# Patient Record
Sex: Male | Born: 1985
Health system: Southern US, Community
[De-identification: ages and names within clinical notes are randomized; demographics above are authoritative.]

## PROBLEM LIST (undated history)

## (undated) DIAGNOSIS — E669 Obesity, unspecified: Secondary | ICD-10-CM

## (undated) DIAGNOSIS — J45909 Unspecified asthma, uncomplicated: Secondary | ICD-10-CM

## (undated) DIAGNOSIS — R509 Fever, unspecified: Secondary | ICD-10-CM

## (undated) DIAGNOSIS — I10 Essential (primary) hypertension: Secondary | ICD-10-CM

## (undated) DIAGNOSIS — R51 Headache: Secondary | ICD-10-CM

## (undated) DIAGNOSIS — G709 Myoneural disorder, unspecified: Secondary | ICD-10-CM

## (undated) HISTORY — DX: Obesity, unspecified: E66.9

## (undated) HISTORY — DX: Essential (primary) hypertension: I10

## (undated) HISTORY — DX: Fever, unspecified: R50.9

## (undated) HISTORY — PX: TONSILLECTOMY: SUR1361

## (undated) HISTORY — DX: Headache: R51

---
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
## 2006-06-09 ENCOUNTER — Ambulatory Visit: Payer: Self-pay | Admitting: Family Medicine

## 2006-06-23 ENCOUNTER — Encounter: Payer: Self-pay | Admitting: Family Medicine

## 2006-06-23 LAB — CONVERTED CEMR LAB
CO2: 24 meq/L (ref 19–32)
Chloride: 103 meq/L (ref 96–112)
Glucose, Bld: 88 mg/dL (ref 70–99)
Lymphocytes Relative: 35 % (ref 12–46)
Lymphs Abs: 3.2 10*3/uL (ref 0.7–3.3)
Neutrophils Relative %: 55 % (ref 43–77)
Platelets: 322 10*3/uL (ref 150–400)
Potassium: 4.1 meq/L (ref 3.5–5.3)
Sodium: 140 meq/L (ref 135–145)
TSH: 1.957 microintl units/mL (ref 0.350–5.50)
Total CHOL/HDL Ratio: 5.2
VLDL: 40 mg/dL (ref 0–40)
WBC: 9.2 10*3/uL (ref 4.0–10.5)

## 2006-07-08 ENCOUNTER — Ambulatory Visit: Payer: Self-pay | Admitting: Family Medicine

## 2006-07-11 ENCOUNTER — Emergency Department (HOSPITAL_COMMUNITY): Admission: EM | Admit: 2006-07-11 | Discharge: 2006-07-11 | Payer: Self-pay | Admitting: Emergency Medicine

## 2006-07-12 ENCOUNTER — Emergency Department (HOSPITAL_COMMUNITY): Admission: EM | Admit: 2006-07-12 | Discharge: 2006-07-13 | Payer: Self-pay | Admitting: Emergency Medicine

## 2006-07-13 ENCOUNTER — Ambulatory Visit: Payer: Self-pay | Admitting: Family Medicine

## 2006-07-13 ENCOUNTER — Encounter: Payer: Self-pay | Admitting: Emergency Medicine

## 2007-08-19 ENCOUNTER — Telehealth: Payer: Self-pay | Admitting: Family Medicine

## 2007-08-26 DIAGNOSIS — I10 Essential (primary) hypertension: Secondary | ICD-10-CM | POA: Insufficient documentation

## 2007-08-26 DIAGNOSIS — R51 Headache: Secondary | ICD-10-CM

## 2007-08-26 DIAGNOSIS — R519 Headache, unspecified: Secondary | ICD-10-CM | POA: Insufficient documentation

## 2007-11-14 ENCOUNTER — Telehealth: Payer: Self-pay | Admitting: Family Medicine

## 2007-11-17 ENCOUNTER — Ambulatory Visit: Payer: Self-pay | Admitting: Family Medicine

## 2007-11-17 LAB — CONVERTED CEMR LAB
Calcium: 9.3 mg/dL (ref 8.4–10.5)
Cholesterol: 136 mg/dL (ref 0–200)
Glucose, Bld: 100 mg/dL — ABNORMAL HIGH (ref 70–99)
HDL: 31 mg/dL — ABNORMAL LOW (ref >39)
Sodium: 135 meq/L (ref 135–145)
Total CHOL/HDL Ratio: 4.4

## 2008-01-18 ENCOUNTER — Telehealth: Payer: Self-pay | Admitting: Family Medicine

## 2008-01-19 ENCOUNTER — Encounter: Payer: Self-pay | Admitting: Family Medicine

## 2008-03-13 ENCOUNTER — Encounter: Payer: Self-pay | Admitting: Family Medicine

## 2008-03-15 ENCOUNTER — Telehealth: Payer: Self-pay | Admitting: Family Medicine

## 2008-04-30 ENCOUNTER — Telehealth: Payer: Self-pay | Admitting: Family Medicine

## 2008-05-01 ENCOUNTER — Telehealth: Payer: Self-pay | Admitting: Family Medicine

## 2008-05-02 ENCOUNTER — Emergency Department (HOSPITAL_COMMUNITY): Admission: EM | Admit: 2008-05-02 | Discharge: 2008-05-02 | Payer: Self-pay | Admitting: Emergency Medicine

## 2008-05-28 ENCOUNTER — Ambulatory Visit: Payer: Self-pay | Admitting: Family Medicine

## 2008-05-29 ENCOUNTER — Telehealth: Payer: Self-pay | Admitting: Family Medicine

## 2008-05-30 ENCOUNTER — Telehealth: Payer: Self-pay | Admitting: Family Medicine

## 2008-06-11 ENCOUNTER — Telehealth: Payer: Self-pay | Admitting: Family Medicine

## 2008-06-11 ENCOUNTER — Ambulatory Visit: Payer: Self-pay | Admitting: Family Medicine

## 2008-06-12 ENCOUNTER — Encounter: Payer: Self-pay | Admitting: Family Medicine

## 2008-06-12 ENCOUNTER — Telehealth: Payer: Self-pay | Admitting: Family Medicine

## 2008-06-13 ENCOUNTER — Telehealth: Payer: Self-pay | Admitting: Family Medicine

## 2008-06-14 ENCOUNTER — Encounter (INDEPENDENT_AMBULATORY_CARE_PROVIDER_SITE_OTHER): Payer: Self-pay

## 2008-06-14 ENCOUNTER — Encounter: Payer: Self-pay | Admitting: Family Medicine

## 2008-06-14 LAB — CONVERTED CEMR LAB
Calcium: 9.5 mg/dL (ref 8.4–10.5)
Eosinophils Absolute: 0.1 10*3/uL (ref 0.0–0.7)
Eosinophils Relative: 1 % (ref 0–5)
HCT: 47.3 % (ref 39.0–52.0)
Lymphs Abs: 5 10*3/uL — ABNORMAL HIGH (ref 0.7–4.0)
MCV: 89.4 fL (ref 78.0–100.0)
Monocytes Absolute: 2.3 10*3/uL — ABNORMAL HIGH (ref 0.1–1.0)
Platelets: 263 10*3/uL (ref 150–400)
Potassium: 4.2 meq/L (ref 3.5–5.3)
Sodium: 141 meq/L (ref 135–145)
WBC: 10.5 10*3/uL (ref 4.0–10.5)

## 2008-06-15 ENCOUNTER — Encounter: Payer: Self-pay | Admitting: Family Medicine

## 2008-06-22 LAB — CONVERTED CEMR LAB
AST: 28 units/L (ref 0–37)
Alkaline Phosphatase: 78 units/L (ref 39–117)
Bilirubin, Direct: 0.1 mg/dL (ref 0.0–0.3)
EBV NA IgG: 0.91 — ABNORMAL HIGH
Indirect Bilirubin: 0.1 mg/dL (ref 0.0–0.9)
Total Bilirubin: 0.2 mg/dL — ABNORMAL LOW (ref 0.3–1.2)

## 2008-10-30 ENCOUNTER — Telehealth: Payer: Self-pay | Admitting: Family Medicine

## 2008-11-01 ENCOUNTER — Telehealth: Payer: Self-pay | Admitting: Family Medicine

## 2008-11-29 ENCOUNTER — Telehealth: Payer: Self-pay | Admitting: Family Medicine

## 2009-01-08 ENCOUNTER — Telehealth: Payer: Self-pay | Admitting: Family Medicine

## 2009-01-23 ENCOUNTER — Telehealth: Payer: Self-pay | Admitting: Family Medicine

## 2009-01-24 ENCOUNTER — Telehealth: Payer: Self-pay | Admitting: Family Medicine

## 2009-03-08 ENCOUNTER — Telehealth: Payer: Self-pay | Admitting: Physician Assistant

## 2009-03-11 ENCOUNTER — Ambulatory Visit: Payer: Self-pay | Admitting: Family Medicine

## 2009-03-25 ENCOUNTER — Ambulatory Visit: Payer: Self-pay | Admitting: Family Medicine

## 2009-04-08 ENCOUNTER — Ambulatory Visit: Payer: Self-pay | Admitting: Family Medicine

## 2009-04-08 ENCOUNTER — Telehealth: Payer: Self-pay | Admitting: Physician Assistant

## 2009-04-08 LAB — CONVERTED CEMR LAB: Rapid Strep: NEGATIVE

## 2009-09-30 ENCOUNTER — Telehealth: Payer: Self-pay | Admitting: Family Medicine

## 2009-10-29 ENCOUNTER — Ambulatory Visit: Payer: Self-pay | Admitting: Family Medicine

## 2009-10-29 DIAGNOSIS — J309 Allergic rhinitis, unspecified: Secondary | ICD-10-CM | POA: Insufficient documentation

## 2009-11-06 ENCOUNTER — Ambulatory Visit: Payer: Self-pay | Admitting: Family Medicine

## 2009-11-06 DIAGNOSIS — J310 Chronic rhinitis: Secondary | ICD-10-CM

## 2009-12-18 ENCOUNTER — Telehealth (INDEPENDENT_AMBULATORY_CARE_PROVIDER_SITE_OTHER): Payer: Self-pay | Admitting: *Deleted

## 2009-12-18 ENCOUNTER — Ambulatory Visit: Payer: Self-pay | Admitting: Family Medicine

## 2010-02-20 NOTE — Assessment & Plan Note (Signed)
Summary: SORE THROAT   Vital Signs:  Patient profile:   25 year old male Height:      71 inches Weight:      370.75 pounds BMI:     51.90 O2 Sat:      100 % on Room air Pulse rate:   90 / minute Resp:     16 per minute BP sitting:   126 / 74  (left arm)  Vitals Entered By: Mauricia Area CMA (November 06, 2009 1:48 PM) CC: Congestion, cough. Slight headache since last night   CC:  Congestion and cough. Slight headache since last night.  History of Present Illness: Pt states his nasal congestion has worsened in the last 2 days.  Mucus is still clear.  Nonproductive cough.  Throat is much better.  No fever or chills.  Has not started taking Loratadine as suggested.  Admits he has been using an over the counter nasal spray "for awhile."  Current Medications (verified): 1)  Lisinopril-Hydrochlorothiazide 20-25 Mg Tabs (Lisinopril-Hydrochlorothiazide) .[name] Kitchen.. 1 Daily in Am  Allergies (verified): No Known Drug Allergies  Past History:  Past medical history reviewed for relevance to current acute and chronic problems.  Past Medical History: Reviewed history from 08/26/2007 and no changes required. * Note: FEBRILE ILLNESS HEADACHE (ICD-784.0) OBESITY (ICD-278.00) HYPERTENSION (ICD-401.9)  Review of Systems General:  Denies chills and fever. ENT:  Complains of nasal congestion, postnasal drainage, and sore throat; denies earache and sinus pressure. CV:  Denies chest pain or discomfort. Resp:  Complains of cough; denies shortness of breath, sputum productive, and wheezing.  Physical Exam  General:  Well-developed,well-nourished,in no acute distress; alert,appropriate and cooperative throughout examination Head:  Normocephalic and atraumatic without obvious abnormalities. No apparent alopecia or balding. Ears:  External ear exam shows no significant lesions or deformities.  Otoscopic examination reveals clear canals, tympanic membranes are intact bilaterally without bulging,  retraction, inflammation or discharge. Hearing is grossly normal bilaterally. Nose:  no external deformity, no sinus percussion tenderness, mucosal erythema, and mucosal edema with clear mucus Mouth:  Oral mucosa and oropharynx without lesions or exudates.   Neck:  No deformities, masses, or tenderness noted. Lungs:  Normal respiratory effort, chest expands symmetrically. Lungs are clear to auscultation, no crackles or wheezes. Heart:  Normal rate and regular rhythm. S1 and S2 normal without gallop, murmur, click, rub or other extra sounds. Cervical Nodes:  No lymphadenopathy noted Psych:  Cognition and judgment appear intact. Alert and cooperative with normal attention span and concentration. No apparent delusions, illusions, hallucinations   Impression & Recommendations:  Problem # 1:  ALLERGIC RHINITIS (ICD-477.9) Assessment Deteriorated  His updated medication list for this problem includes:    Flonase 50 Mcg/act Susp (Fluticasone propionate) ..... Use 2 sprays each nostril once a day  Problem # 2:  RHINITIS MEDICAMENTOSA (ICD-472.0) Assessment: New Discussed with pt that his long term use of over the counter nasal spray is worsening his nasal congestion and swelling.  Discussed that he must stop using this and to expect his symptoms to worsen before they improve.  Complete Medication List: 1)  Lisinopril-hydrochlorothiazide 20-25 Mg Tabs (Lisinopril-hydrochlorothiazide) .[name] Kitchen.. 1 daily in am 2)  Flonase 50 Mcg/act Susp (Fluticasone propionate) .... Use 2 sprays each nostril once a day  Patient Instructions: 1)  Please schedule a follow-up appointment as needed. 2)  You must stop using the over the counter nasal spray.  This is worsening your nasal congestion and swelling. 3)  Use the prescription nasal spray as discussed.  4)  You still should take Loratadine 10 mg once a day. 5)  Increase fluids, rest, etc. Prescriptions: FLONASE 50 MCG/ACT SUSP (FLUTICASONE PROPIONATE) use 2  sprays each nostril once a day  #1 x 1   Entered and Authorized by:   Esperanza Sheets PA   Signed by:   Esperanza Sheets PA on 11/06/2009   Method used:   Electronically to        [facility]  [address] (retail)       [address]       [place], [place]  [address]       Ph: [phone]       Fax: [phone]   RxID:   [id number]    Orders Added: 1)  Est. Patient Level III [401[address]]

## 2010-02-20 NOTE — Progress Notes (Signed)
Summary: blood pressure pill  Phone Note Call from Patient   Summary of Call: needs to get blood pressure samples are either call in a rx   [facility] [phone] Initial call taken by: Rudene Anda,  September 30, 2009 10:16 AM  Follow-up for Phone Call        med sent Follow-up by: Adella Hare LPN,  September 30, 2009 11:19 AM

## 2010-02-20 NOTE — Progress Notes (Signed)
Summary: [facility]  Phone Note Call from Patient   Summary of Call: WANTS YOU TO SEND TO [facility] THE BP MEDS Initial call taken by: Lind Guest,  January 24, 2009 9:39 AM  Follow-up for Phone Call        called patient, left message patient needs ov, will fill for one month, needs ov Follow-up by: Worthy Keeler LPN,  January 24, 2009 10:25 AM  Additional Follow-up for Phone Call Additional follow up Details #1::        sent to [facility] as requested advised ov needed within one month Additional Follow-up by: Worthy Keeler LPN,  January 25, 2009 9:04 AM    Prescriptions: MAXZIDE-25 37.5-25 MG TABS (TRIAMTERENE-HCTZ) one tab by mouth qd  #90 x 0   Entered by:   Worthy Keeler LPN   Authorized by:   Syliva Overman MD   Signed by:   Worthy Keeler LPN on 16/10/9602   Method used:   Electronically to        [facility]  [address] (retail)       [address]       [place], [place]  [address]       Ph: [phone]       Fax: [phone]   RxID:   [id number]

## 2010-02-20 NOTE — Letter (Signed)
Summary: med review sheet  med review sheet   Imported By: Rudene Anda 10/29/2009 11:45:29  _____________________________________________________________________  External Attachment:    Type:   Image     Comment:   External Document

## 2010-02-20 NOTE — Progress Notes (Signed)
  Phone Note Call from Patient   Summary of Call: Patient states BP meds are $90 and he can't afford them. Is there something cheaper that he can be changed to? [facility] Initial call taken by: Everitt Amber LPN,  April 08, 2009 3:09 PM  Follow-up for Phone Call        Pls check with [facility].  This is on their $4 list.  And this is where his prescription was sent to. Follow-up by: Esperanza Sheets PA,  April 08, 2009 4:15 PM  Additional Follow-up for Phone Call Additional follow up Details #1::        Patients grandmother had called. She must not have known it was sent to [facility]. Patient aware Additional Follow-up by: Everitt Amber LPN,  April 08, 2009 4:20 PM

## 2010-02-20 NOTE — Assessment & Plan Note (Signed)
Summary: follow up bp- room 2   Vital Signs:  Patient profile:   25 year old male Height:      71 inches Weight:      374.75 pounds BMI:     52.46 O2 Sat:      95 % on Room air Pulse rate:   90 / minute Resp:     16 per minute BP sitting:   160 / 82  (left arm)  Vitals Entered By: Adella Hare LPN (March 11, 2009 10:58 AM) CC: follow up/ bp  Is Patient Diabetic? No Pain Assessment Patient in pain? no        CC:  follow up/ bp .  History of Present Illness: Pt here today to f/u on HTN & discuss BP meds.  He was well controlled x 2-3 yrs on Benicar HCT but had to d/c due to cost.  Does not currently have any health ins. Pt was changed to [facility] & pt states this caused him to have headaches.  He has been out of the medication for about 1 wk & HA's have resolved.  Current Medications (verified): 1)  Maxzide-25 37.5-25 Mg Tabs (Triamterene-Hctz) .... One Tab By Mouth Qd  Allergies (verified): No Known Drug Allergies  Past History:  Past medical, surgical, family and social histories (including risk factors) reviewed for relevance to current acute and chronic problems.  Past Medical History: Reviewed history from 08/26/2007 and no changes required. * Note: FEBRILE ILLNESS HEADACHE (ICD-784.0) OBESITY (ICD-278.00) HYPERTENSION (ICD-401.9)  Past Surgical History: Reviewed history from 08/26/2007 and no changes required. Right ankle surgery 2002  Family History: Reviewed history from 08/26/2007 and no changes required. NO CJILDREN MOTHER  LIVING  HYPERTENSION AND OBESE FATHER  HEALTHY NO SIBLINGS  Social History: Reviewed history from 08/26/2007 and no changes required. COLLEGE STUDENT Single Never Smoked Alcohol use-no Drug use-no  Review of Systems General:  Denies fatigue and fever. ENT:  Denies nasal congestion, ringing in ears, and sinus pressure. CV:  Denies chest pain or discomfort and palpitations. Resp:  Denies cough and shortness of  breath.  Physical Exam  General:  Well-developed,well-nourished,in no acute distress; alert,appropriate and cooperative throughout examination Head:  Normocephalic and atraumatic without obvious abnormalities. No apparent alopecia or balding. Eyes:  No corneal or conjunctival inflammation noted. EOMI. Perrla. Funduscopic exam benign, without hemorrhages, exudates or papilledema.  Ears:  External ear exam shows no significant lesions or deformities.  Otoscopic examination reveals clear canals, tympanic membranes are intact bilaterally without bulging, retraction, inflammation or discharge. Hearing is grossly normal bilaterally. Nose:  External nasal examination shows no deformity or inflammation. Nasal mucosa are pink and moist without lesions or exudates. Mouth:  Oral mucosa and oropharynx without lesions or exudates.  Teeth in good repair. Neck:  No deformities, masses, or tenderness noted. Lungs:  Normal respiratory effort, chest expands symmetrically. Lungs are clear to auscultation, no crackles or wheezes. Heart:  Normal rate and regular rhythm. S1 and S2 normal without gallop, murmur, click, rub or other extra sounds. Cervical Nodes:  No lymphadenopathy noted Psych:  Cognition and judgment appear intact. Alert and cooperative with normal attention span and concentration. No apparent delusions, illusions, hallucinations   Impression & Recommendations:  Problem # 1:  HYPERTENSION (ICD-401.9) Assessment Deteriorated  The following medications were removed from the medication list:    Maxzide-25 37.5-25 Mg Tabs (Triamterene-hctz) ..... One tab by mouth qd His updated medication list for this problem includes:    Lisinopril-hydrochlorothiazide 20-12.5 Mg Tabs (Lisinopril-hydrochlorothiazide) .[name] KitchenMarland KitchenMarland KitchenMarland Kitchen  1 q am for high blood pressure  BP today: 160/82 Prior BP: 124/84 (06/11/2008)  Labs Reviewed: K+: 4.2 (06/14/2008) Creat: : 1.18 (06/14/2008)   Chol: 136 (11/17/2007)   HDL: 31 (11/17/2007)    LDL: 82 (11/17/2007)   TG: 115 (11/17/2007)  Discussed with pt that he will need lab work in the future.  He is interviewing for a full time job with benefits & hopes he will have health ins in the next few mos.   Problem # 2:  HEADACHE (ICD-784.0) Assessment: Improved  Pt states this has resolved. Was due to [facility].  Problem # 3:  OBESITY (ICD-278.00)  Ht: 71 (03/11/2009)   Wt: 374.75 (03/11/2009)   BMI: 52.46 (03/11/2009)  Encouraged healthier diet & wt loss.   Complete Medication List: 1)  Lisinopril-hydrochlorothiazide 20-12.5 Mg Tabs (Lisinopril-hydrochlorothiazide) .[name] Kitchen.. 1 q am for high blood pressure  Patient Instructions: 1)  Please schedule a follow-up appointment in 3 months. 2)  BP check at the office in 2 weeks. 3)  It is important that you exercise regularly at least 20 minutes 5 times a week. If you develop chest pain, have severe difficulty breathing, or feel very tired , stop exercising immediately and seek medical attention. 4)  You need to lose weight. Consider a lower calorie diet and regular exercise.  Prescriptions: LISINOPRIL-HYDROCHLOROTHIAZIDE 20-12.5 MG TABS (LISINOPRIL-HYDROCHLOROTHIAZIDE) 1 q am for high blood pressure  #90 x 0   Entered and Authorized by:   Esperanza Sheets PA   Signed by:   Esperanza Sheets PA on 03/11/2009   Method used:   Electronically to        [facility]   [address]ail[facility]       [address]       [place], [place]  [address]       Ph: [phone]       Fax: [phone]   RxID:   [id number]

## 2010-02-20 NOTE — Assessment & Plan Note (Signed)
Summary: ov   Vital Signs:  Patient profile:   25 year old male Height:      71 inches Weight:      362.75 pounds O2 Sat:      96 % on Room air Pulse rate:   88 / minute Pulse rhythm:   regular Resp:     16 per minute BP sitting:   140 / 80  (left arm)  Vitals Entered By: Adella Hare LPN (December 18, 2009 3:55 PM)  O2 Flow:  Room air CC: head congestion, chills Is Patient Diabetic? No Pain Assessment Patient in pain? no        CC:  head congestion and chills.  History of Present Illness: Sore throat , chills  and head congestion for 2 days. Reports  that prior to this he had been doing well. Denies recent fever or chills.  Denies chest congestion, or cough productive of sputum. Denies chest pain, palpitations, PND, orthopnea or leg swelling. Denies abdominal pain, nausea, vomitting, diarrhea or constipation. Denies change in bowel movements or bloody stool. Denies dysuria , frequency, incontinence or hesitancy. Denies  joint pain, swelling, or reduced mobility. Denies headaches, vertigo, seizures. Denies depression, anxiety or insomnia. Denies  rash, lesions, or itch.  he has been working on lifestyle modification and has had weight loss success for which he is commended.   Current Medications (verified): 1)  Lisinopril-Hydrochlorothiazide 20-25 Mg Tabs (Lisinopril-Hydrochlorothiazide) .[name] Kitchen.. 1 Daily in Am 2)  Flonase 50 Mcg/act Susp (Fluticasone Propionate) .... Use 2 Sprays Each Nostril Once A Day  Allergies (verified): No Known Drug Allergies  Social History: Engineer, maintenance (IT).Employed Single Living with grandparents Never Smoked Alcohol use-no Drug use-no  Review of Systems      See HPI General:  Complains of chills and fatigue. Eyes:  Denies blurring. ENT:  Complains of nasal congestion, postnasal drainage, and sore throat; denies hoarseness and sinus pressure. Neuro:  Complains of headaches. Endo:  Denies cold intolerance, excessive hunger,  excessive thirst, and excessive urination. Heme:  Denies abnormal bruising and bleeding. Allergy:  Complains of seasonal allergies.  Physical Exam  General:  Well-developed,wobese,in no acute distress; alert,appropriate and cooperative throughout examination HEENT: No facial asymmetry,  EOMI, No sinus tenderness, TM's Clear, oropharynx  erythematous, no exudate noted  Chest: Clear to auscultation bilaterally.  CVS: S1, S2, No murmurs, No S3.   Abd: Soft, Nontender.  MS: Adequate ROM spine, hips, shoulders and knees.  Ext: No edema.   CNS: CN 2-12 intact, power tone and sensation normal throughout.   Skin: Intact, no visible lesions or rashes.  Psych: Good eye contact, normal affect.  Memory intact, not anxious or depressed appearing.    Impression & Recommendations:  Problem # 1:  PHARYNGITIS, ACUTE (ICD-462) Assessment Comment Only  His updated medication list for this problem includes:    Penicillin V Potassium 500 Mg Tabs (Penicillin v potassium) .[name] Kitchen... Take 1 tablet by mouth three times a day  Problem # 2:  OBESITY (ICD-278.00) Assessment: Improved  Ht: 71 (12/18/2009)   Wt: 362.75 (12/18/2009)   BMI: 51.90 (11/06/2009)  Problem # 3:  HYPERTENSION (ICD-401.9) Assessment: Deteriorated  His updated medication list for this problem includes:    Lisinopril-hydrochlorothiazide 20-25 Mg Tabs (Lisinopril-hydrochlorothiazide) .[name] Kitchen... 1 daily in am  BP today: 140/80 Prior BP: 126/74 (11/06/2009)  Labs Reviewed: K+: 4.2 (06/14/2008) Creat: : 1.18 (06/14/2008)   Chol: 136 (11/17/2007)   HDL: 31 (11/17/2007)   LDL: 82 (11/17/2007)   TG: 115 (11/17/2007)  Complete  Medication List: 1)  Lisinopril-hydrochlorothiazide 20-25 Mg Tabs (Lisinopril-hydrochlorothiazide) .[name] Kitchen.. 1 daily in am 2)  Flonase 50 Mcg/act Susp (Fluticasone propionate) .... Use 2 sprays each nostril once a day 3)  Penicillin V Potassium 500 Mg Tabs (Penicillin v potassium) .... Take 1 tablet by mouth three times a  day  Patient Instructions: 1)  Please schedule a follow-up appointment in 4.5 months. 2)  you are being treated for pharyngitis, penicillin is sent rto your pharmacy Prescriptions: PENICILLIN V POTASSIUM 500 MG TABS (PENICILLIN V POTASSIUM) Take 1 tablet by mouth three times a day  #21 x 0   Entered and Authorized by:   Syliva Overman MD   Signed by:   Syliva Overman MD on 12/18/2009   Method used:   Electronically to        [facility]  [address]       [address]       [place], [place]  [address]       Ph: [phone]       Fax: [phone]   RxID:   [id number]    Orders Added: 1)  Est. Patient Level IV [93818]  Appended Document: ov  Laboratory Results    Other Tests  Rapid Strep: negative

## 2010-02-20 NOTE — Assessment & Plan Note (Signed)
Summary: FEVER   room 2   Vital Signs:  Patient profile:   25 year old male Height:      71 inches Weight:      376.25 pounds BMI:     52.67 O2 Sat:      96 % Temp:     100.1 degrees F oral Pulse rate:   112 / minute Resp:     16 per minute BP sitting:   142 / 74  (left arm) Cuff size:   xlg  Vitals Entered By: Everitt Amber LPN (April 08, 2009 10:03 AM) CC: fever, body aches, sore throat, headaches, dizziness, nausea since yesterday    CC:  fever, body aches, sore throat, headaches, dizziness, and nausea since yesterday .  History of Present Illness: Pt presents today with c/o sore thrt x 2 days. temp 101+ since last night  Taking Tylenol, & Theraflu. Also has nasal congestion & post nasal drainage.  No cough.  He is taking his new BP meds.  No probs with.  BP is improved today from last visit.   Current Medications (verified): 1)  Lisinopril-Hydrochlorothiazide 20-25 Mg Tabs (Lisinopril-Hydrochlorothiazide) .[name] Kitchen.. 1 Daily in Am  Allergies (verified): No Known Drug Allergies  Past History:  Past medical history reviewed for relevance to current acute and chronic problems.  Past Medical History: Reviewed history from 08/26/2007 and no changes required. * Note: FEBRILE ILLNESS HEADACHE (ICD-784.0) OBESITY (ICD-278.00) HYPERTENSION (ICD-401.9)  Review of Systems General:  Complains of chills and fever. ENT:  Complains of nasal congestion, postnasal drainage, and sore throat; denies earache and sinus pressure. CV:  Denies chest pain or discomfort. Resp:  Denies cough. Heme:  Denies enlarge lymph nodes.  Physical Exam  General:  Pt appears ill but NAD.  alert, well-developed, well-nourished, well-hydrated, and appropriate dress.   Head:  Normocephalic and atraumatic without obvious abnormalities. No apparent alopecia or balding. Ears:  External ear exam shows no significant lesions or deformities.  Otoscopic examination reveals clear canals, tympanic membranes are  intact bilaterally without bulging, retraction, inflammation or discharge. Hearing is grossly normal bilaterally. Nose:  External nasal examination shows no deformity or inflammation. Nasal mucosa are pink and moist without lesions or exudates. Mouth:  good dentition, no exudates, no postnasal drip, no aphthous ulcers, no tongue abnormalities, and pharyngeal erythema.   Neck:  No deformities, masses, or tenderness noted. Lungs:  Normal respiratory effort, chest expands symmetrically. Lungs are clear to auscultation, no crackles or wheezes. Heart:  Normal rate and regular rhythm. S1 and S2 normal without gallop, murmur, click, rub or other extra sounds. Cervical Nodes:  shotty submand nodes bilat Psych:  Cognition and judgment appear intact. Alert and cooperative with normal attention span and concentration. No apparent delusions, illusions, hallucinations   Impression & Recommendations:  Problem # 1:  PHARYNGITIS, ACUTE (ICD-462) Assessment New  His updated medication list for this problem includes:    Amoxicillin 500 Mg Caps (Amoxicillin) .[name] Kitchen... Take 1 three times a day x 10 days  Problem # 2:  HYPERTENSION (ICD-401.9) Assessment: Improved  The following medications were removed from the medication list:    Lisinopril-hydrochlorothiazide 20-12.5 Mg Tabs (Lisinopril-hydrochlorothiazide) .[name] Kitchen... 1 q am for high blood pressure His updated medication list for this problem includes:    Lisinopril-hydrochlorothiazide 20-25 Mg Tabs (Lisinopril-hydrochlorothiazide) .[name] Kitchen... 1 daily in am  BP today: 142/74 Prior BP: 158/90 (03/25/2009)  Labs Reviewed: K+: 4.2 (06/14/2008) Creat: : 1.18 (06/14/2008)   Chol: 136 (11/17/2007)   HDL: 31 (11/17/2007)  LDL: 82 (11/17/2007)   TG: 115 (11/17/2007)  Complete Medication List: 1)  Lisinopril-hydrochlorothiazide 20-25 Mg Tabs (Lisinopril-hydrochlorothiazide) .[name] Kitchen.. 1 daily in am 2)  Amoxicillin 500 Mg Caps (Amoxicillin) .... Take 1 three times a day x 10  days  Other Orders: Rapid Strep ([address])  Patient Instructions: 1)  Please schedule a follow-up appointment as needed. 2)  Get plenty of rest, drink lots of clear liquids, and use Tylenol or Ibuprofen for fever and comfort. Return in 7-10 days if you're not better:sooner if you're feeling worse. 3)  Take 650-1000mg  of Tylenol every 4-6 hours as needed for relief of pain or comfort of fever AVOID taking more than 4000mg   in a 24 hour period (can cause liver damage in higher doses). 4)  Continue your current blood pressure pills.  Your blood pressure is doing better. Prescriptions: AMOXICILLIN 500 MG CAPS (AMOXICILLIN) take 1 three times a day x 10 days  #30 x 0   Entered and Authorized by:   Esperanza Sheets PA   Signed by:   Esperanza Sheets PA on 04/08/2009   Method used:   Electronically to        [facility]  [address] (retail)       [address]       [place], [place]  [address]       Ph: [phone]       Fax: [phone]   RxID:   [id number]   Laboratory Results    Other Tests  Rapid Strep: negative

## 2010-02-20 NOTE — Progress Notes (Signed)
  Phone Note Call from Patient   Caller: [facility] Summary of Call: patient hasnt taken bp med in a while, states it gives him a headache. can we change this to another generic? [facility] ollie cell [phone] Initial call taken by: Adella Hare LPN,  March 08, 2009 8:11 AM  Follow-up for Phone Call        needs appt.  01-24-09 phone note pt was advised to have a 1 mos follow up appt before would Rx more BP meds.  Follow-up by: Esperanza Sheets PA,  March 08, 2009 8:19 AM  Additional Follow-up for Phone Call Additional follow up Details #1::        advised grandmother with detailed voicemail Additional Follow-up by: Adella Hare LPN,  March 08, 2009 4:17 PM

## 2010-02-20 NOTE — Assessment & Plan Note (Signed)
Summary: office visit   Vital Signs:  Patient profile:   25 year old male Height:      71 inches Weight:      371 pounds BMI:     51.93 O2 Sat:      97 % Pulse rate:   91 / minute Resp:     16 per minute BP sitting:   140 / 72  (left arm) Cuff size:   xl   Vitals Entered By: Everitt Amber LPN  CC: Follow up visit, needs refill on BP meds. Having sinus congestion for a long time, mainly clear.   CC:  Follow up visit, needs refill on BP meds. Having sinus congestion for a long time, and mainly clear.[name] Kitchen  History of Present Illness: Pt presents today with c/o clear nasal congestion x 1-2 mos.  Little sneezing. No sinus pressure, ear pain or sore throat.  No fever.  Hx of seasonal allergies. Not taking any allergy or cold meds currently.  Has been awakening sometimes with slight HA.  Resolves once gets up and around.  No nocturnal arousals.  Only began when the nasal congestion did.  Hx of htn. Taking medications as prescribed and denies side effects.  No headache, chest pain or palpitations..   Current Medications (verified): 1)  Lisinopril-Hydrochlorothiazide 20-25 Mg Tabs (Lisinopril-Hydrochlorothiazide) .[name] Kitchen.. 1 Daily in Am  Allergies (verified): No Known Drug Allergies  Past History:  Past medical history reviewed for relevance to current acute and chronic problems.  Past Medical History: Reviewed history from 08/26/2007 and no changes required. * Note: FEBRILE ILLNESS HEADACHE (ICD-784.0) OBESITY (ICD-278.00) HYPERTENSION (ICD-401.9)  Review of Systems General:  Denies chills and fever. ENT:  Complains of nasal congestion; denies earache, sinus pressure, and sore throat. CV:  Denies chest pain or discomfort, lightheadness, and palpitations. Resp:  Denies cough and shortness of breath. Neuro:  Complains of headaches.  Physical Exam  General:  Well-developed,well-nourished,in no acute distress; alert,appropriate and cooperative throughout examination Head:   Normocephalic and atraumatic without obvious abnormalities. No apparent alopecia or balding. Ears:  External ear exam shows no significant lesions or deformities.  Otoscopic examination reveals clear canals, tympanic membranes are intact bilaterally without bulging, retraction, inflammation or discharge. Hearing is grossly normal bilaterally. Nose:  no external deformity, mucosal erythema, and mucosal edema, small amount of clear mucus.  No sinus TTP. Mouth:  Oral mucosa and oropharynx without lesions or exudates.  Teeth in good repair. Lungs:  Normal respiratory effort, chest expands symmetrically. Lungs are clear to auscultation, no crackles or wheezes. Heart:  Normal rate and regular rhythm. S1 and S2 normal without gallop, murmur, click, rub or other extra sounds. Cervical Nodes:  No lymphadenopathy noted Psych:  Cognition and judgment appear intact. Alert and cooperative with normal attention span and concentration. No apparent delusions, illusions, hallucinations   Impression & Recommendations:  Problem # 1:  HYPERTENSION (ICD-401.9) Assessment Comment Only  His updated medication list for this problem includes:    Lisinopril-hydrochlorothiazide 20-25 Mg Tabs (Lisinopril-hydrochlorothiazide) .[name] Kitchen... 1 daily in am  BP today: 140/72 Prior BP: 142/74 (04/08/2009)  Labs Reviewed: K+: 4.2 (06/14/2008) Creat: : 1.18 (06/14/2008)   Chol: 136 (11/17/2007)   HDL: 31 (11/17/2007)   LDL: 82 (11/17/2007)   TG: 115 (11/17/2007)  Problem # 2:  ALLERGIC RHINITIS (ICD-477.9) Assessment: Deteriorated  Orders: Depo- Medrol 80mg  (J1040) Admin of Therapeutic Inj  intramuscular or subcutaneous (04540)  Complete Medication List: 1)  Lisinopril-hydrochlorothiazide 20-25 Mg Tabs (Lisinopril-hydrochlorothiazide) .[name] Kitchen.. 1 daily in am  Other Orders: Influenza Vaccine NON MCR ([address])  Patient Instructions: 1)  Please schedule a follow-up appointment in 4 months. 2)  You have received a shot of Depo  Medrol today to help with allergies and nasal congestion. 3)  You have also received your flu vaccine. 4)  I have refilled your blood pressure medicine. 5)  I suggest your purchase LORATADINE 10 MG AT [facility] AND TAKE ONE DAILY FOR ALLERGIES. Prescriptions: LISINOPRIL-HYDROCHLOROTHIAZIDE 20-25 MG TABS (LISINOPRIL-HYDROCHLOROTHIAZIDE) 1 daily in am  #30 Each x 3   Entered and Authorized by:   Esperanza Sheets PA   Signed by:   Esperanza Sheets PA on 10/29/2009   Method used:   Electronically to        [facility]  [address] (retail)       [address]       [place], [place]  [address]       Ph: [phone]       Fax: [phone]   RxID:   [id number]    Influenza Vaccine    Vaccine Type: Fluvax Non-MCR    Site: left deltoid    Mfr: novartis     Dose: 0.5 ml    Route: IM    Given by: Everitt Amber LPN    Exp. Date: 05/2010    Lot #: [id number]     Medication Administration  Injection # 1:    Medication: Depo- Medrol 80mg     Diagnosis: ALLERGIC RHINITIS (ICD-477.9)    Route: IM    Site: L deltoid    Exp Date: 06/2010    Lot #: [id number]     Mfr: Pharmacia    Comments: 80mg  given     Patient tolerated injection without complications    Given by: Everitt Amber LPN (October 29, 2009 11:39 AM)  Orders Added: 1)  Est. Patient Level III [44010] 2)  Influenza Vaccine NON MCR [00028] 3)  Depo- Medrol 80mg  [J1040] 4)  Admin of Therapeutic Inj  intramuscular or subcutaneous [27253]

## 2010-02-20 NOTE — Progress Notes (Signed)
Summary: please advise  Phone Note Call from Patient   Summary of Call: sore throat, congestion, headache,chillsHE DOESN"T WANT TO COME IN, he  would like some medication called in, he was just seen last month per mother for the same thing.  Please advise.  She also stated that she has heard that this stuff gets cleared up and then it comes back. Initial call taken by: Curtis Sites,  December 18, 2009 10:59 AM  Follow-up for Phone Call        pls note this pt has no wife, need to see if it is the son or the father, did notget abx when last seen in sept wil lneed ov Follow-up by: Syliva Overman MD,  December 18, 2009 1:08 PM  Additional Follow-up for Phone Call Additional follow up Details #1::        Appt Scheduled Today Additional Follow-up by: Lind Guest,  December 18, 2009 2:46 PM

## 2010-02-20 NOTE — Progress Notes (Signed)
Summary: bp meds  Phone Note Call from Patient   Summary of Call: out of bp medicine  he can not afford any do we have any samples  please call back at [phone] Initial call taken by: Lind Guest,  January 23, 2009 1:10 PM  Follow-up for Phone Call        This med is on the $4 list at [facility]. Called patient back to see if that would be an option for him since we do not have any samples. Left message to call back Follow-up by: Everitt Amber,  January 23, 2009 3:11 PM  Additional Follow-up for Phone Call Additional follow up Details #1::        Patient wanted a 90 day supply. Sent to [facility] Additional Follow-up by: Everitt Amber,  January 24, 2009 9:29 AM    Prescriptions: MAXZIDE-25 37.5-25 MG TABS (TRIAMTERENE-HCTZ) one tab by mouth qd  #90 x 3   Entered by:   Everitt Amber   Authorized by:   Syliva Overman MD   Signed by:   Everitt Amber on 01/24/2009   Method used:   Printed then faxed to ...       [facility]* (retail)       [address]       [place], [place]  [address]       Ph: [phone]       Fax: [phone]   RxID:   [id number]

## 2010-02-20 NOTE — Assessment & Plan Note (Signed)
Summary: blood pressure follow up - room 3   Vital Signs:  Patient profile:   25 year old male Height:      71 inches Weight:      377.50 pounds BMI:     52.84 O2 Sat:      98 % on Room air Pulse rate:   82 / minute Resp:     16 per minute BP sitting:   158 / 90  (left arm)  Vitals Entered By: Adella Hare LPN (March 26, 8411 11:21 AM)  Nutrition Counseling: Patient's BMI is greater than 25 and therefore counseled on weight management options.  Serial Vital Signs/Assessments:  Time      Position  BP       Pulse  Resp  Temp     By                     132/90                         Esperanza Sheets PA  CC: blood pressure follow up Is Patient Diabetic? No Pain Assessment Patient in pain? no        CC:  blood pressure follow up.  History of Present Illness: Pt is here today for a follow up of his htn.  States he has been feeling well.  No HA's or side effects from new BP meds. Denies chest pain or palp.  Pt is also concerned about his weight.  States he has been eating healthier for the last two weeks & is only drinking water during the day.  Plans to start walking for exercise.    Current Medications (verified): 1)  Lisinopril-Hydrochlorothiazide 20-12.5 Mg Tabs (Lisinopril-Hydrochlorothiazide) .[name] Kitchen.. 1 Q Am For High Blood Pressure  Allergies (verified): No Known Drug Allergies  Past History:  Past medical, surgical, family and social histories (including risk factors) reviewed, and no changes noted (except as noted below).  Past Medical History: Reviewed history from 08/26/2007 and no changes required. * Note: FEBRILE ILLNESS HEADACHE (ICD-784.0) OBESITY (ICD-278.00) HYPERTENSION (ICD-401.9)  Past Surgical History: Reviewed history from 08/26/2007 and no changes required. Right ankle surgery 2002  Family History: Reviewed history from 08/26/2007 and no changes required. NO CJILDREN MOTHER  LIVING  HYPERTENSION AND OBESE FATHER  HEALTHY NO SIBLINGS  Social  History: Reviewed history from 08/26/2007 and no changes required. College graduate. Single Living with grandparents Never Smoked Alcohol use-no Drug use-no  Review of Systems CV:  Denies chest pain or discomfort, palpitations, and shortness of breath with exertion. Resp:  Denies cough and shortness of breath. Neuro:  Denies headaches.  Physical Exam  General:  Well-developed,well-nourished,in no acute distress; alert,appropriate and cooperative throughout examination Head:  Normocephalic and atraumatic without obvious abnormalities. No apparent alopecia or balding. Ears:  External ear exam shows no significant lesions or deformities.  Otoscopic examination reveals clear canals, tympanic membranes are intact bilaterally without bulging, retraction, inflammation or discharge. Hearing is grossly normal bilaterally. Nose:  External nasal examination shows no deformity or inflammation. Nasal mucosa are pink and moist without lesions or exudates. Mouth:  Oral mucosa and oropharynx without lesions or exudates.  Teeth in good repair. Neck:  No deformities, masses, or tenderness noted. Lungs:  Normal respiratory effort, chest expands symmetrically. Lungs are clear to auscultation, no crackles or wheezes. Heart:  Normal rate and regular rhythm. S1 and S2 normal without gallop, murmur, click, rub or other extra sounds.  Impression & Recommendations:  Problem # 1:  HYPERTENSION (ICD-401.9) Assessment Unchanged  His updated medication list for this problem includes:    Lisinopril-hydrochlorothiazide 20-12.5 Mg Tabs (Lisinopril-hydrochlorothiazide) .[name] Kitchen... 1 q am for high blood pressure    Lisinopril-hydrochlorothiazide 20-25 Mg Tabs (Lisinopril-hydrochlorothiazide) .[name] Kitchen... 1 daily in am  BP today: 158/90 Prior BP: 160/82 (03/11/2009)  Labs Reviewed: K+: 4.2 (06/14/2008) Creat: : 1.18 (06/14/2008)   Chol: 136 (11/17/2007)   HDL: 31 (11/17/2007)   LDL: 82 (11/17/2007)   TG: 115  (11/17/2007)  Problem # 2:  OBESITY (ICD-278.00) Assessment: Deteriorated  Wt gain 2 1/4 # in last 2 wks. Discussed diet, food choices, serving sizes.  Also encouraged exercise.  Diet/calorie h/o given.  Ht: 71 (03/25/2009)   Wt: 377.50 (03/25/2009)   BMI: 52.84 (03/25/2009)  Complete Medication List: 1)  Lisinopril-hydrochlorothiazide 20-12.5 Mg Tabs (Lisinopril-hydrochlorothiazide) .[name] Kitchen.. 1 q am for high blood pressure 2)  Lisinopril-hydrochlorothiazide 20-25 Mg Tabs (Lisinopril-hydrochlorothiazide) .[name] Kitchen.. 1 daily in am  Patient Instructions: 1)  Please schedule a follow-up appointment in 1 month. 2)  I have increased your Lisinopril HCT to 20/25 1 daily. 3)  It is important that you exercise regularly at least 20 minutes 5 times a week. If you develop chest pain, have severe difficulty breathing, or feel very tired , stop exercising immediately and seek medical attention. 4)  You need to lose weight. Consider a lower calorie diet and regular exercise.  Prescriptions: LISINOPRIL-HYDROCHLOROTHIAZIDE 20-25 MG TABS (LISINOPRIL-HYDROCHLOROTHIAZIDE) 1 daily in am  #30 x 1   Entered and Authorized by:   Esperanza Sheets PA   Signed by:   Esperanza Sheets PA on 03/25/2009   Method used:   Electronically to        [facility]  [address] (retail)       [address]       [place], [place]  [address]       Ph: [phone]       Fax: [phone]   RxID:   [id number]

## 2010-03-04 ENCOUNTER — Ambulatory Visit: Payer: Self-pay | Admitting: Family Medicine

## 2010-05-08 ENCOUNTER — Encounter: Payer: Self-pay | Admitting: Family Medicine

## 2010-05-09 ENCOUNTER — Encounter: Payer: Self-pay | Admitting: Family Medicine

## 2010-05-12 ENCOUNTER — Ambulatory Visit: Payer: Self-pay | Admitting: Family Medicine

## 2010-05-12 ENCOUNTER — Encounter: Payer: Self-pay | Admitting: Family Medicine

## 2010-05-26 ENCOUNTER — Telehealth: Payer: Self-pay | Admitting: Family Medicine

## 2010-05-26 MED ORDER — LISINOPRIL-HYDROCHLOROTHIAZIDE 20-25 MG PO TABS: 1.0000 | ORAL_TABLET | Freq: Every day | ORAL | Status: DC

## 2010-05-26 NOTE — Telephone Encounter (Signed)
Med sent as requested 

## 2010-06-06 NOTE — Op Note (Signed)
[place]  Patient:    Danny Gomez, Danny Gomez                     MRN: [id number] Proc. Date: 01/09/00 Adm. Date:  [id number] Attending:  Marlowe Kays Page                           Operative Report  PREOPERATIVE DIAGNOSIS:  Fibrocartilaginous talocalcaneal coalition, right foot.  POSTOPERATIVE DIAGNOSIS:  Fibrocartilaginous talocalcaneal coalition, right foot.  PROCEDURE:  Excision of fibrocartilaginous talocalcaneal coalition, right foot.  SURGEON:  Illene Labrador. [place], M.D.  ASSISTANT:  Dorie Rank, P.A.  ANESTHESIA:  General.  PATHOLOGY AND INDICATION FOR PROCEDURE:  He has had a painful right foot. Plain x-rays are negative, but CT scan clearly shows a coalition connecting the anterior beak of the os calcis with the overlying talus.  DESCRIPTION OF PROCEDURE:  Prophylactic antibiotics, satisfactory general anesthesia, pneumatic tourniquet.  Right foot and ankle were prepped with Duraprep and draped in a sterile field.  I first used a C-arm to tentatively localize the area and then made a 4 cm incision.  This length was required because he weighed 280 pounds on a 5 foot 7 inch frame.  With spinal needles and direct visualization, I was able to localize the subtalar joint, the talonavicular joint, and the calcaneal midfoot joint.  The fibrous cartilaginous band was located between these, and this was resected with osteotome and rongeur.  This gave me a clear visualization then of the talonavicular and talocalcaneal joints.  I put localizing needles in following this, and then the areas of the bar on the CT scan.  It was felt thus that we had corrected the problem.  Bone wax was placed over the raw bone from the resection of the bar.  The wound was irrigated well with sterile saline, soft tissue was infiltrated with 0.5% plain Marcaine.  The capsule and the subcutaneous tissue deep were reapproximated with interrupted 0  Vicryl, subcutaneous tissue was closed with 2-0 Vicryl, and the skin with 4-0 nylon interrupted mattress sutures.  A well-padded short-leg splint cast was then applied.  He tolerated the procedure well and was taken to the recovery room in satisfactory condition with no noted complications. DD:  01/09/00 TD:  01/10/00 Job: [id number]

## 2010-07-01 ENCOUNTER — Telehealth: Payer: Self-pay | Admitting: Family Medicine

## 2010-07-01 MED ORDER — LISINOPRIL-HYDROCHLOROTHIAZIDE 20-25 MG PO TABS: 1.0000 | ORAL_TABLET | Freq: Every day | ORAL | Status: DC

## 2010-07-01 NOTE — Telephone Encounter (Signed)
Called into [facility] in [place]

## 2010-10-27 ENCOUNTER — Telehealth: Payer: Self-pay | Admitting: Family Medicine

## 2010-10-27 MED ORDER — LISINOPRIL-HYDROCHLOROTHIAZIDE 20-25 MG PO TABS: 1.0000 | ORAL_TABLET | Freq: Every day | ORAL | Status: DC

## 2010-10-27 NOTE — Telephone Encounter (Signed)
I looked back in the old record and saw it was [facility] so I sent it there

## 2010-10-27 NOTE — Telephone Encounter (Signed)
Sent in as requested 

## 2010-11-05 LAB — BASIC METABOLIC PANEL
CO2: 27
Calcium: 9.1
GFR calc Af Amer: >60
Potassium: 3.6
Sodium: 134 — ABNORMAL LOW

## 2010-11-05 LAB — PROTEIN AND GLUCOSE, CSF
Glucose, CSF: 53
Total  Protein, CSF: 30

## 2010-11-05 LAB — CBC
Hemoglobin: 16
MCHC: 34.3
RBC: 5.57

## 2010-11-05 LAB — CSF CULTURE W GRAM STAIN: Culture: NO GROWTH

## 2010-11-05 LAB — DIFFERENTIAL
Basophils Relative: 0
Lymphocytes Relative: 9 — ABNORMAL LOW
Monocytes Absolute: 0.9 — ABNORMAL HIGH
Monocytes Relative: 10
Neutro Abs: 6.9

## 2010-11-05 LAB — CSF CELL COUNT WITH DIFFERENTIAL: Tube #: 4

## 2010-11-05 LAB — GRAM STAIN

## 2011-03-09 ENCOUNTER — Ambulatory Visit (INDEPENDENT_AMBULATORY_CARE_PROVIDER_SITE_OTHER): Payer: 59 | Admitting: Family Medicine

## 2011-03-09 ENCOUNTER — Other Ambulatory Visit: Payer: Self-pay | Admitting: Family Medicine

## 2011-03-09 ENCOUNTER — Encounter: Payer: Self-pay | Admitting: Family Medicine

## 2011-03-09 VITALS — BP 140/80 | HR 105 | Resp 16 | Ht 71.0 in | Wt 325.4 lb

## 2011-03-09 DIAGNOSIS — L02419 Cutaneous abscess of limb, unspecified: Secondary | ICD-10-CM

## 2011-03-09 DIAGNOSIS — E669 Obesity, unspecified: Secondary | ICD-10-CM

## 2011-03-09 DIAGNOSIS — R7301 Impaired fasting glucose: Secondary | ICD-10-CM

## 2011-03-09 DIAGNOSIS — Z23 Encounter for immunization: Secondary | ICD-10-CM

## 2011-03-09 DIAGNOSIS — IMO0002 Reserved for concepts with insufficient information to code with codable children: Secondary | ICD-10-CM

## 2011-03-09 DIAGNOSIS — I10 Essential (primary) hypertension: Secondary | ICD-10-CM

## 2011-03-09 DIAGNOSIS — Z13220 Encounter for screening for lipoid disorders: Secondary | ICD-10-CM

## 2011-03-09 DIAGNOSIS — R5381 Other malaise: Secondary | ICD-10-CM

## 2011-03-09 LAB — TSH: TSH: 0.824 u[IU]/mL (ref 0.350–4.500)

## 2011-03-09 LAB — CBC WITH DIFFERENTIAL/PLATELET
Eosinophils Relative: 2 % (ref 0–5)
HCT: 46.1 % (ref 39.0–52.0)
Lymphocytes Relative: 22 % (ref 12–46)
Lymphs Abs: 2.3 10*3/uL (ref 0.7–4.0)
MCV: 87.1 fL (ref 78.0–100.0)
Monocytes Absolute: 0.9 10*3/uL (ref 0.1–1.0)
Monocytes Relative: 8 % (ref 3–12)
RBC: 5.29 MIL/uL (ref 4.22–5.81)
WBC: 10.5 10*3/uL (ref 4.0–10.5)

## 2011-03-09 LAB — LIPID PANEL
HDL: 33 mg/dL — ABNORMAL LOW (ref >39)
LDL Cholesterol: 101 mg/dL — ABNORMAL HIGH (ref 0–99)

## 2011-03-09 LAB — BASIC METABOLIC PANEL
BUN: 9 mg/dL (ref 6–23)
CO2: 28 mEq/L (ref 19–32)
Chloride: 101 mEq/L (ref 96–112)
Creat: 0.87 mg/dL (ref 0.50–1.35)

## 2011-03-09 LAB — HEMOGLOBIN A1C: Mean Plasma Glucose: 114 mg/dL (ref <117)

## 2011-03-09 MED ORDER — SULFAMETHOXAZOLE-TRIMETHOPRIM 800-160 MG PO TABS: 1.0000 | ORAL_TABLET | Freq: Two times a day (BID) | ORAL | Status: AC

## 2011-03-09 MED ORDER — HYDROCODONE-ACETAMINOPHEN 5-500 MG PO TABS: 1.0000 | ORAL_TABLET | Freq: Four times a day (QID) | ORAL | Status: DC | PRN

## 2011-03-09 NOTE — Progress Notes (Signed)
  Subjective:    Patient ID: Danny Gomez, male    DOB: [birth year], 26 y.o.   MRN: [id number]  HPI    Review of Systems     Objective:   Physical Exam        Assessment & Plan:

## 2011-03-09 NOTE — Patient Instructions (Addendum)
Abscess- take the antibiotics as prescribed. Use the pain medication as needed F/U tomorrow for a recheck Do not shave, you can wash in warm water, the packing may come out, just keep covered Care After An abscess (also called a boil or furuncle) is an infected area that contains a collection of pus. Signs and symptoms of an abscess include pain, tenderness, redness, or hardness, or you may feel a moveable soft area under your skin. An abscess can occur anywhere in the body. The infection may spread to surrounding tissues causing cellulitis. A cut (incision) by the surgeon was made over your abscess and the pus was drained out. Gauze may have been packed into the space to provide a drain that will allow the cavity to heal from the inside outwards. The boil may be painful for 5 to 7 days. Most people with a boil do not have high fevers. Your abscess, if seen early, may not have localized, and may not have been lanced. If not, another appointment may be required for this if it does not get better on its own or with medications. HOME CARE INSTRUCTIONS    Only take over-the-counter or prescription medicines for pain, discomfort, or fever as directed by your caregiver.     When you bathe, soak and then remove gauze or iodoform packs at least daily or as directed by your caregiver. You may then wash the wound gently with mild soapy water. Repack with gauze or do as your caregiver directs.  SEEK IMMEDIATE MEDICAL CARE IF:    You develop increased pain, swelling, redness, drainage, or bleeding in the wound site.     You develop signs of generalized infection including muscle aches, chills, fever, or a general ill feeling.     An oral temperature above 102 F (38.9 C) develops, not controlled by medication.  See your caregiver for a recheck if you develop any of the symptoms described above. If medications (antibiotics) were prescribed, take them as directed. Document Released: 07/24/2004 Document Revised:  09/17/2010 Document Reviewed: 03/21/2007 [facility] Patient Information 2012 [place], [place].

## 2011-03-09 NOTE — Progress Notes (Signed)
Addended by: Kandis Fantasia B on: 03/09/2011 01:31 PM   Modules accepted: Orders

## 2011-03-09 NOTE — Assessment & Plan Note (Signed)
Continue current dose of blood pressure medication. Fasting labs to be obtained

## 2011-03-09 NOTE — Progress Notes (Signed)
  Subjective:    Patient ID: Danny Gomez, male    DOB: 03-12-1985, 26 y.o.   MRN: [id number]  HPI    Patient here for her abscess of right underarm. One week ago he noticed a small bump coming up since then has spread into large boil. He states the armpit thinking this would help. He has not changed his deodorant recently. He's had boils before but does not get them that often. He denies any fever, nausea, vomiting. No change in sensation in the lower hand   Pt due for fasting labs  Review of Systems - per above     Objective:   Physical Exam GEN- NAD, alert and oriented x3 Right axilla- large 5x3 cm abcess in axillar, TTP, flutuant at center, +induration,surouding erythema,  Procedure Note- incision and drainage of right axilla abscess Risk and procedures discussed . Verbal consent obtained. All questions answered Anesthetic- 1% lidocaine with epi-3 cc Anti-septic-Betadine swab x2 Abscess was lanced with 11 blade superficially- approx 1.5cm cut. Large amount of pus expressed. One culture taken. Minimal blood loss. Patient tolerated procedure well. Approximately 3inches of iodoform placed          Assessment & Plan:

## 2011-03-09 NOTE — Progress Notes (Signed)
Addended by: Kandis Fantasia B on: 03/09/2011 01:50 PM   Modules accepted: Orders

## 2011-03-09 NOTE — Assessment & Plan Note (Signed)
Status post I and D. of right axilla abscess. Cultures sent. We'll start Bactrim double strength twice a day. Vicodin for pain # 10 tablets.

## 2011-03-10 ENCOUNTER — Ambulatory Visit (INDEPENDENT_AMBULATORY_CARE_PROVIDER_SITE_OTHER): Payer: 59 | Admitting: Family Medicine

## 2011-03-10 DIAGNOSIS — L02419 Cutaneous abscess of limb, unspecified: Secondary | ICD-10-CM

## 2011-03-10 DIAGNOSIS — IMO0002 Reserved for concepts with insufficient information to code with codable children: Secondary | ICD-10-CM

## 2011-03-10 NOTE — Progress Notes (Signed)
  Subjective:    Patient ID: Danny Gomez, male    DOB: 1985-10-15, 26 y.o.   MRN: [id number]  HPI Patient here for quick followup on his right axillary abscess. He's been taking his antibiotics as prescribed. He has used Vicodin for pain last night. The wound did drain yesterday. He cleansed the area with warm water and peroxide. He has minimal drainage today.   Review of Systems - No fever     Objective:   Physical Exam  GEN- NAD, alert and oriented x 3 Right axilla- minimal pus and blood from incision, iodoform gauze removed, pus noted, +induration but improved, no fluctuant areas palpable, decreased erythema      Assessment & Plan:

## 2011-03-10 NOTE — Assessment & Plan Note (Signed)
Interval improvement status post incision and drainage. Continue antibiotics and pain medication as needed. Continue cleaning with warm soapy water. Followup on Friday. Small amount of 4x4 packed into wound and covered. Patient will repeat process at home.

## 2011-03-12 LAB — WOUND CULTURE: Gram Stain: NONE SEEN

## 2011-03-13 ENCOUNTER — Ambulatory Visit (INDEPENDENT_AMBULATORY_CARE_PROVIDER_SITE_OTHER): Payer: 59 | Admitting: Family Medicine

## 2011-03-13 DIAGNOSIS — IMO0002 Reserved for concepts with insufficient information to code with codable children: Secondary | ICD-10-CM

## 2011-03-13 NOTE — Progress Notes (Signed)
Patient ID: Danny Gomez, male   DOB: [birth year], 26 y.o.   MRN: [id number] Recheck on axillary abscess s/p I and D. No drainage from lesion, minimal pain Pt taking antibiotics  No fever, no concerns  Right axilla- closure occuring, mild induration around incision, minimal erythema noted, non tender, no pus   A/P Right axillary abscess-recheck- healing by secondary intention, looks very good, dressing applied, wound does not need to be rechecked. Completed course of antibiotics F/U as needed No charge for visit

## 2011-03-13 NOTE — Assessment & Plan Note (Signed)
Proteus sensitive to bactrim

## 2011-04-29 ENCOUNTER — Ambulatory Visit: Payer: 59 | Admitting: Family Medicine

## 2011-04-29 ENCOUNTER — Encounter: Payer: Self-pay | Admitting: Family Medicine

## 2011-04-29 ENCOUNTER — Ambulatory Visit (INDEPENDENT_AMBULATORY_CARE_PROVIDER_SITE_OTHER): Payer: 59 | Admitting: Family Medicine

## 2011-04-29 VITALS — BP 140/76 | HR 76 | Resp 18 | Ht 71.0 in | Wt 326.1 lb

## 2011-04-29 DIAGNOSIS — J302 Other seasonal allergic rhinitis: Secondary | ICD-10-CM

## 2011-04-29 DIAGNOSIS — J309 Allergic rhinitis, unspecified: Secondary | ICD-10-CM

## 2011-04-29 DIAGNOSIS — R51 Headache: Secondary | ICD-10-CM

## 2011-04-29 MED ORDER — KETOROLAC TROMETHAMINE 60 MG/2ML IM SOLN
60.0000 mg | Freq: Once | INTRAMUSCULAR | Status: AC
  Administered 2011-04-29: 60 mg via INTRAMUSCULAR

## 2011-04-29 NOTE — Patient Instructions (Signed)
You have been given a shot of pain medication- Toradol for headache You can take tylenol or motrin for headache Stay hydrated Start your zyrtec Return if this does not improve or call

## 2011-04-30 ENCOUNTER — Encounter: Payer: Self-pay | Admitting: Family Medicine

## 2011-04-30 NOTE — Progress Notes (Signed)
  Subjective:    Patient ID: Danny Gomez, male    DOB: [birth year], 26 y.o.   MRN: [id number]  HPI   Headache since this a.m. He has no history of migraines. He will cut with a headache across temporal area associated with nausea no emesis he admits to photophobia. He did go to work without the need to be checked. He's not taking any over-the-counter medications. He has noticed that his allergies have started acting up however he has not started his Zyrtec yet which typically controls his allergies.    Review of Systems    GEN- denies fatigue, fever, weight loss,weakness, recent illness HEENT- denies eye drainage, change in vision,+ nasal discharge, CVS- denies chest pain, palpitations RESP- denies SOB, cough, wheeze ABD- denies N/V, change in stools, abd pain Neuro- + headache, denies izziness, syncope, seizure activity      Objective:   Physical Exam GEN- NAD, alert and oriented x3 HEENT- PERRL, EOMI, non injected sclera, pink conjunctiva, MMM, oropharynx clear, fundoscopic exam benign Neck- Supple, no thryomegaly CVS- RRR, no murmur RESP-CTAB EXT- No edema Pulses- Radial, DP- 2+ Neuro- CNII-XII grossly in tact       Assessment & Plan:     Headache- normal neurological exam, no red flags, given shot of toradol. Nausea has resolved  Allergic rhinitis- pt to start his allergy medications at home.

## 2011-06-22 ENCOUNTER — Other Ambulatory Visit: Payer: Self-pay | Admitting: Family Medicine

## 2011-06-30 ENCOUNTER — Ambulatory Visit (INDEPENDENT_AMBULATORY_CARE_PROVIDER_SITE_OTHER): Payer: 59 | Admitting: Family Medicine

## 2011-06-30 ENCOUNTER — Encounter: Payer: Self-pay | Admitting: Family Medicine

## 2011-06-30 VITALS — BP 122/76 | HR 94 | Temp 99.4°F | Resp 18 | Ht 71.0 in | Wt 323.1 lb

## 2011-06-30 DIAGNOSIS — I10 Essential (primary) hypertension: Secondary | ICD-10-CM

## 2011-06-30 DIAGNOSIS — E669 Obesity, unspecified: Secondary | ICD-10-CM

## 2011-06-30 DIAGNOSIS — R509 Fever, unspecified: Secondary | ICD-10-CM

## 2011-06-30 LAB — CBC WITH DIFFERENTIAL/PLATELET
Eosinophils Absolute: 0 10*3/uL (ref 0.0–0.7)
Hemoglobin: 16.3 g/dL (ref 13.0–17.0)
Lymphocytes Relative: 24 % (ref 12–46)
Lymphs Abs: 1.5 10*3/uL (ref 0.7–4.0)
MCH: 29.4 pg (ref 26.0–34.0)
Monocytes Relative: 13 % — ABNORMAL HIGH (ref 3–12)
Neutrophils Relative %: 61 % (ref 43–77)
RBC: 5.54 MIL/uL (ref 4.22–5.81)
WBC: 6.2 10*3/uL (ref 4.0–10.5)

## 2011-06-30 LAB — BASIC METABOLIC PANEL
CO2: 26 mEq/L (ref 19–32)
Calcium: 9.7 mg/dL (ref 8.4–10.5)
Chloride: 98 mEq/L (ref 96–112)
Glucose, Bld: 89 mg/dL (ref 70–99)
Potassium: 4 mEq/L (ref 3.5–5.3)
Sodium: 136 mEq/L (ref 135–145)

## 2011-06-30 MED ORDER — SULFAMETHOXAZOLE-TRIMETHOPRIM 800-160 MG PO TABS: 1.0000 | ORAL_TABLET | Freq: Two times a day (BID) | ORAL | Status: AC

## 2011-06-30 NOTE — Patient Instructions (Addendum)
F/u in 5 month  You are  being treated for an acute febrile illness, no specific source of infection is identified, and I believe it may be viral.  Please call if your condition gets worse  Take tylenol alternating with advil for temperature and pain, drink a lot of water and rest.  Antibiotics are sent to your pharmacy.  We will contact you re labwork from today CBc and diff and chem 7 sat pls

## 2011-06-30 NOTE — Progress Notes (Signed)
  Subjective:    Patient ID: Danny Gomez, male    DOB: 06-17-1985, 26 y.o.   MRN: [id number]  HPI 4 day h/o generalized, fatigue, intermittent chills, sore throat and neck pain. Denies sinus pressure or drainage, no productive cough . Prior to this he had been well, he recently had an examination 1 day ago and has been stressed out over this   Review of Systems See HPI C/O fatigue and myalgias Denies chest pains, palpitations and leg swelling Denies abdominal pain, nausea, vomiting,diarrhea or constipation.   Denies dysuria, frequency, hesitancy or incontinence. Denies headaches, seizures, numbness, or tingling. Denies depression, anxiety or insomnia. Denies skin break down or rash.        Objective:   Physical Exam  Patient alert and oriented and in no cardiopulmonary distress.ill appearing  HEENT: No facial asymmetry, EOMI, no sinus tenderness,  oropharynx pink and moist.  Neck supple shotty  adenopathy.  Chest: Clear to auscultation bilaterally.  CVS: S1, S2 no murmurs, no S3.  ABD: Soft non tender. Bowel sounds normal.  Ext: No edema  MS: Adequate ROM spine, shoulders, hips and knees.  Skin: Intact, no ulcerations or rash noted.  Psych: Good eye contact, normal affect. Memory intact not anxious or depressed appearing.  CNS: CN 2-12 intact, power, tone and sensation normal throughout.       Assessment & Plan:

## 2011-07-09 NOTE — Assessment & Plan Note (Signed)
Improved. Pt applauded on succesful weight loss through lifestyle change, and encouraged to continue same. Weight loss goal set for the next several months.  

## 2011-07-09 NOTE — Assessment & Plan Note (Signed)
Controlled, no change in medication  

## 2011-07-09 NOTE — Assessment & Plan Note (Signed)
Sym[ptoms and signs more consistent with viral etiology, pt to be out of work today, antibiotic prescribed, but advised to hold pending labs

## 2011-09-13 ENCOUNTER — Encounter (HOSPITAL_COMMUNITY): Payer: Self-pay | Admitting: Emergency Medicine

## 2011-09-13 ENCOUNTER — Emergency Department (HOSPITAL_COMMUNITY)
Admission: EM | Admit: 2011-09-13 | Discharge: 2011-09-13 | Disposition: A | Discharge status: Home / Self Care | Payer: 59 | Attending: Emergency Medicine | Admitting: Emergency Medicine

## 2011-09-13 ENCOUNTER — Emergency Department (HOSPITAL_COMMUNITY): Payer: 59

## 2011-09-13 DIAGNOSIS — M19079 Primary osteoarthritis, unspecified ankle and foot: Secondary | ICD-10-CM | POA: Insufficient documentation

## 2011-09-13 DIAGNOSIS — M79673 Pain in unspecified foot: Secondary | ICD-10-CM

## 2011-09-13 MED ORDER — IBUPROFEN 800 MG PO TABS
800.0000 mg | ORAL_TABLET | Freq: Once | ORAL | Status: AC
  Administered 2011-09-13: 800 mg via ORAL
  Filled 2011-09-13: qty 1

## 2011-09-13 MED ORDER — OXYCODONE-ACETAMINOPHEN 5-325 MG PO TABS: 1.0000 | ORAL_TABLET | ORAL | Status: AC | PRN

## 2011-09-13 MED ORDER — DIAZEPAM 5 MG PO TABS
5.0000 mg | ORAL_TABLET | Freq: Once | ORAL | Status: AC
  Administered 2011-09-13: 5 mg via ORAL
  Filled 2011-09-13: qty 1

## 2011-09-13 MED ORDER — HYDROMORPHONE HCL PF 1 MG/ML IJ SOLN
1.0000 mg | Freq: Once | INTRAMUSCULAR | Status: AC
  Administered 2011-09-13: 1 mg via INTRAMUSCULAR
  Filled 2011-09-13: qty 1

## 2011-09-13 MED ORDER — NAPROXEN 500 MG PO TABS: 500.0000 mg | ORAL_TABLET | Freq: Two times a day (BID) | ORAL | Status: DC | PRN

## 2011-09-13 NOTE — ED Provider Notes (Signed)
History   This chart was scribed for Raeford Razor, MD by Charolett Bumpers . The patient was seen in room APA08/APA08. Patient's care was started at 1144.    CSN: [id number]  Arrival date & time 09/13/11  1104   First MD Initiated Contact with Patient 09/13/11 1144      Chief Complaint  Patient presents with  . Foot Swelling    (Consider location/radiation/quality/duration/timing/severity/associated sxs/prior treatment) HPI Danny Gomez is a 26 y.o. male who presents to the Emergency Department complaining of constant, moderate, worsening bilaterally ankle and foot pain with associated swelling for the past 3 days. Pt reports that the pain is worse in his right ankle. Pt states that he normally has swelling in his feet, but reports it has worsened. Pt describes the pain as throbbing and achy. Pt reports his symptoms are aggravated with ambulating and states that he is unable to bear weight. Pt reports he took Flexeril and naproxen with no relief. Pt denies any recent fall or injuries. Pt denies any fevers, chills or SOB. Pt reports a h/o a right ankle surgery. Pt reports a h/o similar pain prior to surgery and intermittently afterwards. Pt states that he works on his feet. Pt denies any h/o DVT or diabetes.  Orthopedics: [facility].   Past Medical History  Diagnosis Date  . Febrile illness   . Headache   . Obesity   . Hypertension     Past Surgical History  Procedure Date  . Right ankle surgery 2002  . Tonsillectomy     Family History  Problem Relation Age of Onset  . Hypertension Mother   . Obesity Mother     History  Substance Use Topics  . Smoking status: Never Smoker   . Smokeless tobacco: Not on file  . Alcohol Use: No      Review of Systems  Constitutional: Negative for fever and chills.  Respiratory: Negative for shortness of breath.   Musculoskeletal: Positive for joint swelling and arthralgias.       Bilaterally foot pain and  swelling.   All other systems reviewed and are negative.    Allergies  Review of patient's allergies indicates no known allergies.  Home Medications   Current Outpatient Rx  Name Route Sig Dispense Refill  . ASPIRIN-ACETAMINOPHEN-CAFFEINE 250-250-65 MG PO TABS Oral Take 2 tablets by mouth every 6 (six) hours as needed. headaches    . LISINOPRIL-HYDROCHLOROTHIAZIDE 20-25 MG PO TABS  TAKE ONE TABLET BY MOUTH EVERY DAY 30 tablet 3    BP 138/72  Pulse 66  Temp 98.1 F (36.7 C) (Oral)  Resp 19  Ht 5\' 11"  (1.803 m)  Wt 325 lb (147.419 kg)  BMI 45.33 kg/m2  SpO2 100%  Physical Exam  Nursing note and vitals reviewed. Constitutional: He is oriented to person, place, and time. He appears well-developed and well-nourished. No distress.  HENT:  Head: Normocephalic and atraumatic.  Eyes: Conjunctivae are normal. Right eye exhibits no discharge. Left eye exhibits no discharge.  Neck: Neck supple.  Cardiovascular: Normal rate, regular rhythm, normal heart sounds and intact distal pulses.  Exam reveals no gallop and no friction rub.   No murmur heard. Pulmonary/Chest: Effort normal and breath sounds normal. No respiratory distress.  Abdominal: Soft. He exhibits no distension. There is no tenderness.  Musculoskeletal: He exhibits edema and tenderness.       Bilaterally lower extremity edema with right worse than left. Tenderness in the area of the right lateral malleolus. Good  DP pulses. Well healed scar on medial aspect of right foot. Flat feet bilaterally. No calf tenderness. No palpable cords.   Neurological: He is alert and oriented to person, place, and time.  Skin: Skin is warm and dry.  Psychiatric: He has a normal mood and affect. His behavior is normal. Thought content normal.    ED Course  Procedures (including critical care time)  DIAGNOSTIC STUDIES: Oxygen Saturation is 100% on room air, normal by my interpretation.    COORDINATION OF CARE:  12:18-Discussed planned  course of treatment with the patient including pain medication, who is agreeable at this time.   12:30-Medication Orders: Hydromorphone (Dilaudid) injection 1 mg-once; Ibuprofen (Advil, Motrin) tablet 800 mg-once; Diazepam (Valium) tablet 5 mg-once   Labs Reviewed - No data to display Dg Ankle Complete Right  09/13/2011  *RADIOLOGY REPORT*  Clinical Data: Ankle pain and swelling.  RIGHT ANKLE - COMPLETE 3+ VIEW  Comparison: None.  Findings: The ankle mortise is maintained.  No ankle fracture or osteochondral lesion.  As demonstrated on the foot films there is extensive dorsal spurring from the distal talus and talonavicular joint degenerative changes.  IMPRESSION: No acute ankle fracture or osteochondral abnormality.   Original Report Authenticated By: P. Loralie Champagne, M.D.    Dg Foot Complete Right  09/13/2011  *RADIOLOGY REPORT*  Clinical Data: Right foot pain and swelling.  RIGHT FOOT COMPLETE - 3+ VIEW  Comparison: None  Findings: There are degenerative changes noted at the talonavicular joint which are fairly advanced for the patient's age.  This could be due to prior trauma. The remaining joints are fairly well maintained.  There is exuberant dorsal spurring off of the distal talus.  Os trigonum is also noted.  No acute fracture.  Diffuse soft tissue swelling is suggested.  IMPRESSION:  1.  Advanced degenerative changes at the talonavicular joint with marked dorsal spurring.  No obvious findings for tarsal coalition. Findings could be due to remote trauma. 2.  No acute fracture. 3.  Diffuse soft tissue swelling.   Original Report Authenticated By: P. Loralie Champagne, M.D.      1. Foot pain       MDM  26yM with atraumatic foot pain and swelling. Imaging significant for advanced DJD. Morbid obesity likely contributing. Pt reports losing weight and encouraged to continue. PRN pain meds. RICE. Return precautions discussed. Outpt fu otherwise.   I personally preformed the services scribed in  my presence. The recorded information has been reviewed and considered. Raeford Razor, MD.       Raeford Razor, MD 09/16/11 1000

## 2011-09-13 NOTE — ED Notes (Signed)
Patient with c/o bilateral foot and ankle swelling and pain. Patient denies injury. H/o surgery to right foot in past. States he is unable to bear weight.

## 2011-10-07 ENCOUNTER — Telehealth: Payer: Self-pay | Admitting: Family Medicine

## 2011-10-07 MED ORDER — LISINOPRIL-HYDROCHLOROTHIAZIDE 20-25 MG PO TABS: ORAL_TABLET | ORAL | Status: DC

## 2011-10-07 NOTE — Telephone Encounter (Signed)
Med sent in.

## 2011-10-19 ENCOUNTER — Ambulatory Visit: Payer: 59 | Admitting: Family Medicine

## 2011-12-10 ENCOUNTER — Ambulatory Visit: Payer: 59 | Admitting: Family Medicine

## 2012-02-17 ENCOUNTER — Telehealth: Payer: Self-pay | Admitting: Family Medicine

## 2012-02-17 ENCOUNTER — Other Ambulatory Visit: Payer: Self-pay

## 2012-02-17 MED ORDER — LISINOPRIL-HYDROCHLOROTHIAZIDE 20-25 MG PO TABS: ORAL_TABLET | ORAL | Status: DC

## 2012-02-17 NOTE — Telephone Encounter (Signed)
Med refilled.

## 2012-07-27 ENCOUNTER — Other Ambulatory Visit: Payer: Self-pay | Admitting: Family Medicine

## 2012-08-24 ENCOUNTER — Encounter: Payer: Self-pay | Admitting: Family Medicine

## 2012-08-24 ENCOUNTER — Ambulatory Visit (INDEPENDENT_AMBULATORY_CARE_PROVIDER_SITE_OTHER): Payer: BC Managed Care – PPO | Admitting: Family Medicine

## 2012-08-24 VITALS — BP 138/80 | HR 86 | Resp 16 | Ht 71.0 in | Wt 339.0 lb

## 2012-08-24 DIAGNOSIS — J309 Allergic rhinitis, unspecified: Secondary | ICD-10-CM

## 2012-08-24 DIAGNOSIS — I10 Essential (primary) hypertension: Secondary | ICD-10-CM

## 2012-08-24 DIAGNOSIS — Z139 Encounter for screening, unspecified: Secondary | ICD-10-CM

## 2012-08-24 DIAGNOSIS — E669 Obesity, unspecified: Secondary | ICD-10-CM

## 2012-08-24 MED ORDER — LISINOPRIL-HYDROCHLOROTHIAZIDE 20-25 MG PO TABS: ORAL_TABLET | ORAL | Status: DC

## 2012-08-24 MED ORDER — FLUTICASONE PROPIONATE 50 MCG/ACT NA SUSP: 2.0000 | Freq: Every day | NASAL | Status: DC

## 2012-08-24 NOTE — Assessment & Plan Note (Signed)
Uncontrolled. Re educated re appropriate use of medication for allergy control

## 2012-08-24 NOTE — Progress Notes (Signed)
  Subjective:    Patient ID: Danny Gomez, male    DOB: [birth year], 27 y.o.   MRN: [id number]  HPI The PT is here for follow up and re-evaluation of chronic medical conditions, medication management and review of any available recent lab and radiology data.  Preventive health is updated, specifically  Immunization.   The PT denies any adverse reactions to current medications since the last visit.  Has uncontrolled and inadequately treated allergies, keeps afrin at all times, not using maintenance medication as prescribed or recommended. Has fallen of th exercise and healthy eating since January, intends t resume as he is aware of the need to improve health and lose weight C/o right posterior thigh pain after sitting for a while. I explain that this is due to direct pressure to the nerve       Review of Systems See HPI Denies recent fever or chills. Denies sinus pressure, has chronic  nasal congestion,denies  ear pain or sore throat. Denies chest congestion, productive cough or wheezing. Denies chest pains, palpitations and leg swelling Denies abdominal pain, nausea, vomiting,diarrhea or constipation.   Denies dysuria, frequency, hesitancy or incontinence. Intermittent right ankle pain, he had injured this in childhood Denies headaches, seizures, numbness, or tingling. Denies depression, anxiety or insomnia. Denies skin break down or rash.        Objective:   Physical Exam  Patient alert and oriented and in no cardiopulmonary distress.  HEENT: No facial asymmetry, EOMI, no sinus tenderness,  oropharynx pink and moist.  Neck supple no adenopathy.Nasal mucosa erythematous and edematous  Chest: Clear to auscultation bilaterally.  CVS: S1, S2 no murmurs, no S3.  ABD: Soft non tender. Bowel sounds normal.  Ext: No edema  MS: Adequate ROM spine, shoulders, hips and knees.  Skin: Intact, no ulcerations or rash noted.  Psych: Good eye contact, normal affect. Memory intact  not anxious or depressed appearing.  CNS: CN 2-12 intact, power, tone and sensation normal throughout.       Assessment & Plan:

## 2012-08-24 NOTE — Assessment & Plan Note (Signed)
Controlled, no change in medication DASH diet and commitment to daily physical activity for a minimum of 30 minutes discussed and encouraged, as a part of hypertension management. The importance of attaining a healthy weight is also discussed.  

## 2012-08-24 NOTE — Patient Instructions (Addendum)
F/u in 4 month, call if you need me before  Call in October for the flu vaccine  Labs today, CBC, lipid, chem 7, TSH, HBA1C, vit D  For your allergies  Use the zyrtec every day, also the flonase. Flush nostrils once or twice daily with saline for cleansing If excessive drainage, 1 sudafed once daily for 2 to 3 days will help, or afrin for 2 days max in a row  Weight loss goal of 3 pounds per month   Resume regular exercise for improved health, and weight loss

## 2012-08-24 NOTE — Assessment & Plan Note (Signed)
Unchanged. Patient re-educated about  the importance of commitment to a  minimum of 150 minutes of exercise per week. The importance of healthy food choices with portion control discussed. Encouraged to start a food diary, count calories and to consider  joining a support group. Sample diet sheets offered. Goals set by the patient for the next several months.    

## 2012-08-31 LAB — LIPID PANEL
HDL: 34 mg/dL — ABNORMAL LOW (ref >39)
LDL Cholesterol: 93 mg/dL (ref 0–99)

## 2012-08-31 LAB — CBC WITH DIFFERENTIAL/PLATELET
Basophils Absolute: 0 10*3/uL (ref 0.0–0.1)
Basophils Relative: 0 % (ref 0–1)
Eosinophils Absolute: 0.3 10*3/uL (ref 0.0–0.7)
Eosinophils Relative: 3 % (ref 0–5)
Lymphocytes Relative: 22 % (ref 12–46)
MCHC: 34.7 g/dL (ref 30.0–36.0)
MCV: 83.5 fL (ref 78.0–100.0)
Monocytes Absolute: 0.7 10*3/uL (ref 0.1–1.0)
Platelets: 316 10*3/uL (ref 150–400)
RDW: 14.3 % (ref 11.5–15.5)
WBC: 9.5 10*3/uL (ref 4.0–10.5)

## 2012-08-31 LAB — BASIC METABOLIC PANEL
BUN: 15 mg/dL (ref 6–23)
CO2: 31 mEq/L (ref 19–32)
Calcium: 10 mg/dL (ref 8.4–10.5)
Chloride: 100 mEq/L (ref 96–112)
Creat: 0.82 mg/dL (ref 0.50–1.35)
Glucose, Bld: 83 mg/dL (ref 70–99)
Potassium: 4.1 mEq/L (ref 3.5–5.3)
Sodium: 139 mEq/L (ref 135–145)

## 2012-08-31 LAB — TSH: TSH: 1.472 u[IU]/mL (ref 0.350–4.500)

## 2012-09-01 ENCOUNTER — Other Ambulatory Visit: Payer: Self-pay | Admitting: Family Medicine

## 2012-09-01 LAB — HEMOGLOBIN A1C: Mean Plasma Glucose: 111 mg/dL (ref <117)

## 2012-09-01 LAB — VITAMIN D 25 HYDROXY (VIT D DEFICIENCY, FRACTURES): Vit D, 25-Hydroxy: 15 ng/mL — ABNORMAL LOW (ref 30–89)

## 2012-09-01 MED ORDER — ERGOCALCIFEROL 1.25 MG (50000 UT) PO CAPS: 50000.0000 [IU] | ORAL_CAPSULE | ORAL | Status: DC

## 2012-11-24 ENCOUNTER — Other Ambulatory Visit: Payer: Self-pay

## 2013-01-02 ENCOUNTER — Ambulatory Visit: Payer: BC Managed Care – PPO | Admitting: Family Medicine

## 2013-02-04 ENCOUNTER — Other Ambulatory Visit: Payer: Self-pay | Admitting: Family Medicine

## 2013-02-06 ENCOUNTER — Encounter: Payer: Self-pay | Admitting: Family Medicine

## 2013-02-06 ENCOUNTER — Ambulatory Visit (INDEPENDENT_AMBULATORY_CARE_PROVIDER_SITE_OTHER): Payer: BC Managed Care – PPO | Admitting: Family Medicine

## 2013-02-06 VITALS — BP 140/90 | HR 80 | Resp 18 | Ht 71.0 in | Wt 343.0 lb

## 2013-02-06 DIAGNOSIS — Z111 Encounter for screening for respiratory tuberculosis: Secondary | ICD-10-CM

## 2013-02-06 DIAGNOSIS — E669 Obesity, unspecified: Secondary | ICD-10-CM

## 2013-02-06 DIAGNOSIS — J019 Acute sinusitis, unspecified: Secondary | ICD-10-CM

## 2013-02-06 DIAGNOSIS — E559 Vitamin D deficiency, unspecified: Secondary | ICD-10-CM | POA: Insufficient documentation

## 2013-02-06 DIAGNOSIS — I10 Essential (primary) hypertension: Secondary | ICD-10-CM

## 2013-02-06 DIAGNOSIS — J309 Allergic rhinitis, unspecified: Secondary | ICD-10-CM

## 2013-02-06 MED ORDER — LISINOPRIL-HYDROCHLOROTHIAZIDE 20-25 MG PO TABS: 1.0000 | ORAL_TABLET | Freq: Every day | ORAL | Status: DC

## 2013-02-06 MED ORDER — ERGOCALCIFEROL 1.25 MG (50000 UT) PO CAPS: 50000.0000 [IU] | ORAL_CAPSULE | ORAL | Status: DC

## 2013-02-06 MED ORDER — SULFAMETHOXAZOLE-TMP DS 800-160 MG PO TABS: 1.0000 | ORAL_TABLET | Freq: Two times a day (BID) | ORAL | Status: AC

## 2013-02-06 NOTE — Addendum Note (Signed)
Addended by: Kandis FantasiaSLADE, COURTNEY B on: 02/06/2013 08:46 AM   Modules accepted: Orders

## 2013-02-06 NOTE — Assessment & Plan Note (Signed)
Uncontrolled, start daily flonase 

## 2013-02-06 NOTE — Patient Instructions (Signed)
F/u in 3.5 months, call if you need me before  TB test today return for flu vaccine next Wednesday early afternoon  It is important that you exercise regularly at least 30 minutes 5 times a week. If you develop chest pain, have severe difficulty breathing, or feel very tired, stop exercising immediately and seek medical attention  A healthy diet is rich in fruit, vegetables and whole grains. Poultry fish, nuts and beans are a healthy choice for protein rather then red meat. A low sodium diet and drinking 64 ounces of water daily is generally recommended. Oils and sweet should be limited. Carbohydrates especially for those who are diabetic or overweight, should be limited to 34-45 gram per meal. It is important to eat on a regular schedule, at least 3 times daily. Snacks should be primarily fruits, vegetables or nuts.   Commit to weight loss of 3.5 to 4 ppounds per month  Important to take BP meds every day so blood pressure remains controlled  Medication is sent in for sinus infection take entire 67 day course,  New for allergies is daily flonase, also flush with saline regularlu

## 2013-02-06 NOTE — Assessment & Plan Note (Signed)
Deteriorated. Patient re-educated about  the importance of commitment to a  minimum of 150 minutes of exercise per week. The importance of healthy food choices with portion control discussed. Encouraged to start a food diary, count calories and to consider  joining a support group. Sample diet sheets offered. Goals set by the patient for the next several months.    

## 2013-02-06 NOTE — Progress Notes (Signed)
   Subjective:    Patient ID: Virgina Norfolkhillip T Tangonan, male    DOB: 09/18/1985, 28 y.o.   MRN: [id number]  HPI  The PT is here for follow up and re-evaluation of chronic medical conditions, medication management and review of any available recent lab and radiology data.  Preventive health is updated, specifically  Cancer screening and Immunization.  Refusing flu vaccine, however 2 week h/o head pressure , yellow post nasal drainage, no fever or chills, laryngitis . The PT denies any adverse reactions to current medications since the last visit.  Pt has not been exercising and has gained weight, also diet has been off, will change thios.  Weight loss goal of 3 to 4 pounds per month      Review of Systems See HPI Denies recent fever or chills. Denies sinus pressure, nasal congestion, ear pain or sore throat. Denies chest congestion, productive cough or wheezing. Denies chest pains, palpitations and leg swelling Denies abdominal pain, nausea, vomiting,diarrhea or constipation.   Denies dysuria, frequency, hesitancy or incontinence. Denies joint pain, swelling and limitation in mobility. Denies headaches, seizures, numbness, or tingling. Denies depression, anxiety or insomnia. Denies skin break down or rash.         Objective:   Physical Exam  Patient alert and oriented and in no cardiopulmonary distress.  HEENT: No facial asymmetry, EOMI, maxillary  sinus tenderness,  oropharynx pink and moist.  Neck supple no adenopathy.  Chest: Clear to auscultation bilaterally.  CVS: S1, S2 no murmurs, no S3.  ABD: Soft non tender. Bowel sounds normal.  Ext: No edema  MS: Adequate ROM spine, shoulders, hips and knees.  Skin: Intact, no ulcerations or rash noted.  Psych: Good eye contact, normal affect. Memory intact not anxious or depressed appearing.  CNS: CN 2-12 intact, power, tone and sensation normal throughout.       Assessment & Plan:

## 2013-02-06 NOTE — Assessment & Plan Note (Signed)
Uncontrolled, out of med x 2 days, needs to resume meds DASH diet and commitment to daily physical activity for a minimum of 30 minutes discussed and encouraged, as a part of hypertension management. The importance of attaining a healthy weight is also discussed.

## 2013-02-06 NOTE — Assessment & Plan Note (Signed)
1 week history with pressure and green drainage

## 2013-02-08 LAB — TB SKIN TEST: TB Skin Test: NEGATIVE

## 2013-02-15 ENCOUNTER — Ambulatory Visit: Payer: BC Managed Care – PPO

## 2013-05-11 ENCOUNTER — Other Ambulatory Visit (HOSPITAL_COMMUNITY)
Admission: RE | Admit: 2013-05-11 | Discharge: 2013-05-11 | Disposition: A | Discharge status: Home / Self Care | Payer: BC Managed Care – PPO | Source: Ambulatory Visit | Attending: Family Medicine | Admitting: Family Medicine

## 2013-05-11 ENCOUNTER — Ambulatory Visit (INDEPENDENT_AMBULATORY_CARE_PROVIDER_SITE_OTHER): Payer: BC Managed Care – PPO

## 2013-05-11 VITALS — BP 146/80 | Wt 347.0 lb

## 2013-05-11 DIAGNOSIS — N3000 Acute cystitis without hematuria: Secondary | ICD-10-CM

## 2013-05-11 DIAGNOSIS — Z113 Encounter for screening for infections with a predominantly sexual mode of transmission: Secondary | ICD-10-CM | POA: Insufficient documentation

## 2013-05-11 LAB — POCT URINALYSIS DIPSTICK
BILIRUBIN UA: NEGATIVE
GLUCOSE UA: NEGATIVE
KETONES UA: NEGATIVE
Leukocytes, UA: NEGATIVE
NITRITE UA: NEGATIVE
Protein, UA: NEGATIVE
SPEC GRAV UA: 1.025
Urobilinogen, UA: 0.2
pH, UA: 7

## 2013-05-11 NOTE — Progress Notes (Signed)
Patient presented to office with complaints of burning with urination x 1 week.  Urine specimen collected (clean catch).  Urine sent for culture and sensitivity and g/c

## 2013-05-15 LAB — URINE CULTURE: Colony Count: 45000

## 2013-05-15 LAB — URINE CYTOLOGY ANCILLARY ONLY
CHLAMYDIA, DNA PROBE: NEGATIVE
Neisseria Gonorrhea: NEGATIVE

## 2013-05-15 MED ORDER — AMPICILLIN 500 MG PO CAPS: 500.0000 mg | ORAL_CAPSULE | Freq: Three times a day (TID) | ORAL | Status: DC

## 2013-05-15 NOTE — Addendum Note (Signed)
Addended by: Kandis FantasiaSLADE, Dayrin Stallone B on: 05/15/2013 05:05 PM   Modules accepted: Orders

## 2013-05-29 ENCOUNTER — Ambulatory Visit: Payer: BC Managed Care – PPO | Admitting: Family Medicine

## 2013-08-03 ENCOUNTER — Other Ambulatory Visit: Payer: Self-pay | Admitting: Family Medicine

## 2013-09-11 ENCOUNTER — Telehealth: Payer: Self-pay | Admitting: Family Medicine

## 2013-09-11 MED ORDER — LISINOPRIL-HYDROCHLOROTHIAZIDE 20-25 MG PO TABS: ORAL_TABLET | ORAL | Status: DC

## 2013-09-11 NOTE — Telephone Encounter (Signed)
1 refill only sent  

## 2013-12-11 ENCOUNTER — Other Ambulatory Visit: Payer: Self-pay | Admitting: Family Medicine

## 2013-12-20 ENCOUNTER — Ambulatory Visit (INDEPENDENT_AMBULATORY_CARE_PROVIDER_SITE_OTHER): Payer: BC Managed Care – PPO | Admitting: Family Medicine

## 2013-12-20 ENCOUNTER — Encounter: Payer: Self-pay | Admitting: Family Medicine

## 2013-12-20 VITALS — BP 140/80 | HR 77 | Resp 16 | Ht 71.0 in | Wt 350.1 lb

## 2013-12-20 DIAGNOSIS — I10 Essential (primary) hypertension: Secondary | ICD-10-CM

## 2013-12-20 DIAGNOSIS — M25569 Pain in unspecified knee: Secondary | ICD-10-CM

## 2013-12-20 DIAGNOSIS — M25579 Pain in unspecified ankle and joints of unspecified foot: Secondary | ICD-10-CM

## 2013-12-20 DIAGNOSIS — R7302 Impaired glucose tolerance (oral): Secondary | ICD-10-CM

## 2013-12-20 DIAGNOSIS — Z0000 Encounter for general adult medical examination without abnormal findings: Secondary | ICD-10-CM

## 2013-12-20 NOTE — Progress Notes (Signed)
   Subjective:    Patient ID: Danny Gomez, male    DOB: 06/14/1985, 28 y.o.   MRN: [id number]  HPI Patient is in for annual physical , refusews flu vaccine C/o weight gain wants to start medication to help curb appetite C/o intermittent ankle and knee pain esp after standing for long periods, he works 2 jobs, is aware that weight is a major factor in nthe paoin and wants to work on this  Review of Systems    See HPI  Objective:   Physical Exam BP 140/80 mmHg  Pulse 77  Resp 16  Ht 5\' 11"  (1.803 m)  Wt 350 lb 1.9 oz (158.813 kg)  BMI 48.85 kg/m2  SpO2 98% Pleasant morbidly obese young male, alert and oriented x 3, in no cardio-pulmonary distress. Afebrile. HEENT No facial trauma or asymetry. Sinuses non tender. EOMI, PERTL, fundoscopic exam is negative for hemorhages or exudates. External ears normal, tympanic membranes clear. Oropharynx moist, no exudate, good dentition. Neck: supple, no adenopathy,JVD or thyromegaly.No bruits.  Chest: Clear to ascultation bilaterally.No crackles or wheezes. Non tender to palpation  Breast: No asymetry,no masses. No nipple discharge or inversion. No axillary or supraclavicular adenopathy  Cardiovascular system; Heart sounds normal,  S1 and  S2 ,no S3.  No murmur, or thrill. Apical beat not displaced Peripheral pulses normal.  Abdomen: Soft, non tender, no organomegaly or masses. No bruits. Bowel sounds normal. No guarding, tenderness or rebound.  Musculoskeletal exam: Full ROM of spine, hips , shoulders and knees. No deformity ,swelling or crepitus noted. No muscle wasting or atrophy.   Neurologic: Cranial nerves 2 to 12 intact. Power, tone ,sensation and reflexes normal throughout. No disturbance in gait. No tremor.  Skin: Intact, no ulceration, erythema , scaling or rash noted. Pigmentation normal throughout  Psych; Normal mood and affect. Judgement and concentration normal         Assessment & Plan:    Annual physical exam Annual exam as documented. Counseling done  re healthy lifestyle involving commitment to 150 minutes exercise per week, heart healthy diet, and attaining healthy weight.The importance of adequate sleep also discussed. Regular seat belt use and home safety, is also discussed. Changes in health habits are decided on by the patient with goals and time frames  set for achieving them. Immunization and cancer screening needs are specifically addressed at this visit.   Morbid obesity Deteriorated. Patient re-educated about  the importance of commitment to a  minimum of 150 minutes of exercise per week. The importance of healthy food choices with portion control discussed. Encouraged to start a food diary, count calories and to consider  joining a support group. Sample diet sheets offered. Goals set by the patient for the next several months.

## 2013-12-20 NOTE — Patient Instructions (Addendum)
F/u in 3 month, call if you need me before  CBc, TSH, chem 7 and HBA1C today   New for weight loss is phentermine HALF daily before breakfast  It is important that you exercise regularly at least 30 minutes 5 times a week. If you develop chest pain, have severe difficulty breathing, or feel very tired, stop exercising immediately and seek medical attention    Weight lo0ss goal os 4 to 6 [ounds per month  Tylenol for arthritic pain

## 2013-12-21 ENCOUNTER — Other Ambulatory Visit: Payer: Self-pay | Admitting: Family Medicine

## 2013-12-21 LAB — CBC
HEMATOCRIT: 45.5 % (ref 39.0–52.0)
HEMOGLOBIN: 15.7 g/dL (ref 13.0–17.0)
MCH: 28.8 pg (ref 26.0–34.0)
MCHC: 34.5 g/dL (ref 30.0–36.0)
MCV: 83.3 fL (ref 78.0–100.0)
MPV: 9.3 fL — ABNORMAL LOW (ref 9.4–12.4)
Platelets: 317 10*3/uL (ref 150–400)
RBC: 5.46 MIL/uL (ref 4.22–5.81)
RDW: 14.5 % (ref 11.5–15.5)
WBC: 8.4 10*3/uL (ref 4.0–10.5)

## 2013-12-21 LAB — COMPREHENSIVE METABOLIC PANEL
ALT: 20 U/L (ref 0–53)
AST: 19 U/L (ref 0–37)
Albumin: 4.3 g/dL (ref 3.5–5.2)
Alkaline Phosphatase: 55 U/L (ref 39–117)
BILIRUBIN TOTAL: 0.4 mg/dL (ref 0.2–1.2)
BUN: 14 mg/dL (ref 6–23)
CO2: 21 meq/L (ref 19–32)
CREATININE: 0.82 mg/dL (ref 0.50–1.35)
Calcium: 9.7 mg/dL (ref 8.4–10.5)
Chloride: 101 mEq/L (ref 96–112)
Glucose, Bld: 76 mg/dL (ref 70–99)
Potassium: 3.8 mEq/L (ref 3.5–5.3)
Sodium: 138 mEq/L (ref 135–145)
Total Protein: 7.7 g/dL (ref 6.0–8.3)

## 2013-12-21 LAB — HEMOGLOBIN A1C
HEMOGLOBIN A1C: 5.8 % — AB (ref <5.7)
MEAN PLASMA GLUCOSE: 120 mg/dL — AB (ref <117)

## 2013-12-21 LAB — TSH: TSH: 1.144 u[IU]/mL (ref 0.350–4.500)

## 2013-12-27 ENCOUNTER — Other Ambulatory Visit: Payer: Self-pay

## 2013-12-27 MED ORDER — PHENTERMINE HCL 37.5 MG PO TABS: 37.5000 mg | ORAL_TABLET | Freq: Every day | ORAL | Status: DC

## 2014-01-19 DIAGNOSIS — Z0000 Encounter for general adult medical examination without abnormal findings: Secondary | ICD-10-CM | POA: Insufficient documentation

## 2014-01-19 NOTE — Assessment & Plan Note (Signed)

## 2014-01-19 NOTE — Assessment & Plan Note (Signed)
Deteriorated. Patient re-educated about  the importance of commitment to a  minimum of 150 minutes of exercise per week. The importance of healthy food choices with portion control discussed. Encouraged to start a food diary, count calories and to consider  joining a support group. Sample diet sheets offered. Goals set by the patient for the next several months.    

## 2014-03-19 ENCOUNTER — Other Ambulatory Visit: Payer: Self-pay | Admitting: Family Medicine

## 2014-04-16 ENCOUNTER — Ambulatory Visit: Payer: Self-pay | Admitting: Family Medicine

## 2014-04-16 ENCOUNTER — Encounter: Payer: Self-pay | Admitting: Family Medicine

## 2014-07-24 ENCOUNTER — Ambulatory Visit (INDEPENDENT_AMBULATORY_CARE_PROVIDER_SITE_OTHER): Payer: Self-pay | Admitting: Family Medicine

## 2014-07-24 ENCOUNTER — Encounter: Payer: Self-pay | Admitting: Family Medicine

## 2014-07-24 VITALS — BP 138/74 | HR 100 | Resp 18 | Ht 71.0 in | Wt 310.0 lb

## 2014-07-24 DIAGNOSIS — I10 Essential (primary) hypertension: Secondary | ICD-10-CM

## 2014-07-24 DIAGNOSIS — Z72821 Inadequate sleep hygiene: Secondary | ICD-10-CM | POA: Insufficient documentation

## 2014-07-24 MED ORDER — LISINOPRIL-HYDROCHLOROTHIAZIDE 20-25 MG PO TABS: 1.0000 | ORAL_TABLET | Freq: Every day | ORAL | Status: DC

## 2014-07-24 NOTE — Patient Instructions (Signed)
F/u in 6 month, call if you need me before  CONGRATS on improved health  Call for lab order when ready  Info on sleep hygiene is priovided

## 2014-07-24 NOTE — Progress Notes (Signed)
Subjective:    Patient ID: Danny Gomez, male    DOB: 03/07/1985, 29 y.o.   MRN: [id number]  HPI The PT is here for follow up and re-evaluation of chronic medical conditions, medication management and review of any available recent lab and radiology data.  Preventive health is updated, specifically  Cancer screening and Immunization.    The PT denies any adverse reactions to current medications since the last visit.  3 week h/o excess tiredness but her has not been  Sleeping for more than 5 hrs during this time as he changed his exercise time,also leaves TV on which is not good     Review of Systems See HPI   Denies recent fever or chills. Denies sinus pressure, nasal congestion, ear pain or sore throat. Denies chest congestion, productive cough or wheezing. Denies chest pains, palpitations and leg swelling Denies abdominal pain, nausea, vomiting,diarrhea or constipation.   Denies dysuria, frequency, hesitancy or incontinence. Denies joint pain, swelling and limitation in mobility. Denies headaches, seizures, numbness, or tingling. Denies depression, anxiety or insomnia. Denies skin break down or rash.     Objective:   Physical Exam  BP 138/74 mmHg  Pulse 100  Resp 18  Ht 5\' 11"  (1.803 m)  Wt 310 lb (140.615 kg)  BMI 43.26 kg/m2  SpO2 98%   Patient alert and oriented and in no cardiopulmonary distress.  HEENT: No facial asymmetry, EOMI,   oropharynx pink and moist.  Neck supple no JVD, no mass.  Chest: Clear to auscultation bilaterally.  CVS: S1, S2 no murmurs, no S3.Regular rate.  ABD: Soft non tender.   Ext: No edema  MS: Adequate ROM spine, shoulders, hips and knees.  Skin: Intact, no ulcerations or rash noted.  Psych: Good eye contact, normal affect. Memory intact not anxious or depressed appearing.  CNS: CN 2-12 intact, power,  normal throughout.no focal deficits noted.        Assessment & Plan:  Essential hypertension Controlled, though  diastolic pressure still slightly elevated No med change DASH diet and commitment to daily physical activity for a minimum of 30 minutes discussed and encouraged, as a part of hypertension management. The importance of attaining a healthy weight is also discussed.  BP/Weight 07/24/2014 12/20/2013 05/11/2013 02/06/2013 08/24/2012 09/13/2011 06/30/2011  Systolic BP 138 140 146 140 138 138 122  Diastolic BP 74 80 80 90 80 72 76  Wt. (Lbs) 310 350.12 347.04 343 339 325 323.12  BMI 43.26 48.85 48.42 47.86 47.3 45.35 45.09   F/u in 6 months     Morbid obesity Great improvement, pt congratulated on this. Patient re-educated about  the importance of commitment to a  minimum of 150 minutes of exercise per week.  The importance of healthy food choices with portion control discussed. Encouraged to start a food diary, count calories and to consider  joining a support group. Sample diet sheets offered. Goals set by the patient for the next several months.   Weight /BMI 07/24/2014 12/20/2013 05/11/2013  WEIGHT 310 lb 350 lb 1.9 oz 347 lb 0.6 oz  HEIGHT - 5\' 11"  -  BMI 43.26 kg/m2 48.85 kg/m2 48.42 kg/m2    Current exercise per week 150 minutes. Advised daily exercise and smaller portion sizes, has done extremely well averaging 7 pound weight loss per month, on no appetite suppressant regularlly   Inadequate sleep hygiene 3 week h/o excess fatue , but has been getting 4 hrs sleep, also tends to sleep with TV on, sleep  hygiene discussed and literature also provided. Sleep apnea screen is negative

## 2014-07-24 NOTE — Assessment & Plan Note (Signed)
Controlled, though diastolic pressure still slightly elevated No med change DASH diet and commitment to daily physical activity for a minimum of 30 minutes discussed and encouraged, as a part of hypertension management. The importance of attaining a healthy weight is also discussed.  BP/Weight 07/24/2014 12/20/2013 05/11/2013 02/06/2013 08/24/2012 09/13/2011 06/30/2011  Systolic BP 138 140 146 140 138 138 122  Diastolic BP 74 80 80 90 80 72 76  Wt. (Lbs) 310 350.12 347.04 343 339 325 323.12  BMI 43.26 48.85 48.42 47.86 47.3 45.35 45.09   F/u in 6 months

## 2014-07-24 NOTE — Assessment & Plan Note (Signed)
3 week h/o excess fatue , but has been getting 4 hrs sleep, also tends to sleep with TV on, sleep hygiene discussed and literature also provided. Sleep apnea screen is negative

## 2014-07-24 NOTE — Assessment & Plan Note (Addendum)
Great improvement, pt congratulated on this. Patient re-educated about  the importance of commitment to a  minimum of 150 minutes of exercise per week.  The importance of healthy food choices with portion control discussed. Encouraged to start a food diary, count calories and to consider  joining a support group. Sample diet sheets offered. Goals set by the patient for the next several months.   Weight /BMI 07/24/2014 12/20/2013 05/11/2013  WEIGHT 310 lb 350 lb 1.9 oz 347 lb 0.6 oz  HEIGHT - 5\' 11"  -  BMI 43.26 kg/m2 48.85 kg/m2 48.42 kg/m2    Current exercise per week 150 minutes. Advised daily exercise and smaller portion sizes, has done extremely well averaging 7 pound weight loss per month, on no appetite suppressant regularlly

## 2014-10-30 ENCOUNTER — Telehealth: Payer: Self-pay

## 2014-10-30 DIAGNOSIS — I10 Essential (primary) hypertension: Secondary | ICD-10-CM

## 2014-10-30 NOTE — Telephone Encounter (Signed)
-----   Message from Kerri Perches, MD sent at 07/24/2014 12:57 PM EDT ----- Regarding: i recommend fasting chem 7 asap even tho no ins, pls order and let him know Tell him to pls drink his water tho fasting!

## 2014-12-31 ENCOUNTER — Telehealth: Payer: Self-pay | Admitting: Family Medicine

## 2014-12-31 ENCOUNTER — Other Ambulatory Visit: Payer: Self-pay

## 2014-12-31 MED ORDER — LISINOPRIL-HYDROCHLOROTHIAZIDE 20-25 MG PO TABS: 1.0000 | ORAL_TABLET | Freq: Every day | ORAL | Status: DC

## 2014-12-31 NOTE — Telephone Encounter (Signed)
Patient aware ane med refilled

## 2014-12-31 NOTE — Telephone Encounter (Signed)
There are more good than bad things abouit this drug, needs to be specific so I can address the concern, would not change before visit unless compelling reason clarified

## 2014-12-31 NOTE — Telephone Encounter (Signed)
Patient is currently taking lisinopril-hydrochlorothiazide (PRINZIDE,ZESTORETIC) 20-25 MG per tablet for his blood pressure he is asking if it can be changed, please advise?

## 2014-12-31 NOTE — Telephone Encounter (Signed)
Wanted to be taken off the lisinipril. Danny SpanielSaid he has been hearing bad things about it and it wants it changed to something else. Couldn't make an appt before the firest of the year due to his insurance. Scheduled for Jan 16th. Ran out of his med yesterday. Please advise

## 2015-02-04 ENCOUNTER — Ambulatory Visit (INDEPENDENT_AMBULATORY_CARE_PROVIDER_SITE_OTHER): Payer: BLUE CROSS/BLUE SHIELD | Admitting: Family Medicine

## 2015-02-04 ENCOUNTER — Encounter: Payer: Self-pay | Admitting: Family Medicine

## 2015-02-04 VITALS — BP 142/80 | HR 66 | Resp 18 | Ht 71.0 in | Wt 326.0 lb

## 2015-02-04 DIAGNOSIS — Z114 Encounter for screening for human immunodeficiency virus [HIV]: Secondary | ICD-10-CM | POA: Diagnosis not present

## 2015-02-04 DIAGNOSIS — Z23 Encounter for immunization: Secondary | ICD-10-CM

## 2015-02-04 DIAGNOSIS — I10 Essential (primary) hypertension: Secondary | ICD-10-CM

## 2015-02-04 DIAGNOSIS — J3089 Other allergic rhinitis: Secondary | ICD-10-CM | POA: Diagnosis not present

## 2015-02-04 DIAGNOSIS — R7302 Impaired glucose tolerance (oral): Secondary | ICD-10-CM

## 2015-02-04 LAB — COMPREHENSIVE METABOLIC PANEL
ALBUMIN: 4.1 g/dL (ref 3.6–5.1)
ALT: 16 U/L (ref 9–46)
AST: 16 U/L (ref 10–40)
Alkaline Phosphatase: 49 U/L (ref 40–115)
BUN: 11 mg/dL (ref 7–25)
CALCIUM: 9.5 mg/dL (ref 8.6–10.3)
CHLORIDE: 101 mmol/L (ref 98–110)
CO2: 28 mmol/L (ref 20–31)
Creat: 0.8 mg/dL (ref 0.60–1.35)
Glucose, Bld: 83 mg/dL (ref 65–99)
POTASSIUM: 4.4 mmol/L (ref 3.5–5.3)
Sodium: 137 mmol/L (ref 135–146)
TOTAL PROTEIN: 6.9 g/dL (ref 6.1–8.1)
Total Bilirubin: 0.4 mg/dL (ref 0.2–1.2)

## 2015-02-04 LAB — TSH: TSH: 0.721 u[IU]/mL (ref 0.350–4.500)

## 2015-02-04 LAB — LIPID PANEL
CHOLESTEROL: 156 mg/dL (ref 125–200)
HDL: 37 mg/dL — ABNORMAL LOW (ref >=40)
LDL Cholesterol: 106 mg/dL (ref <130)
TRIGLYCERIDES: 64 mg/dL (ref <150)
Total CHOL/HDL Ratio: 4.2 Ratio (ref <=5.0)
VLDL: 13 mg/dL (ref <30)

## 2015-02-04 LAB — CBC
HEMATOCRIT: 45.4 % (ref 39.0–52.0)
HEMOGLOBIN: 15.4 g/dL (ref 13.0–17.0)
MCH: 29.3 pg (ref 26.0–34.0)
MCHC: 33.9 g/dL (ref 30.0–36.0)
MCV: 86.3 fL (ref 78.0–100.0)
MPV: 8.9 fL (ref 8.6–12.4)
Platelets: 301 10*3/uL (ref 150–400)
RBC: 5.26 MIL/uL (ref 4.22–5.81)
RDW: 14.5 % (ref 11.5–15.5)
WBC: 7.5 10*3/uL (ref 4.0–10.5)

## 2015-02-04 LAB — HEMOGLOBIN A1C
Hgb A1c MFr Bld: 5.6 % (ref <5.7)
Mean Plasma Glucose: 114 mg/dL (ref <117)

## 2015-02-04 MED ORDER — PREDNISONE 5 MG PO TABS: 5.0000 mg | ORAL_TABLET | Freq: Two times a day (BID) | ORAL | Status: DC

## 2015-02-04 MED ORDER — IBUPROFEN 800 MG PO TABS: 800.0000 mg | ORAL_TABLET | Freq: Three times a day (TID) | ORAL | Status: DC

## 2015-02-04 MED ORDER — TRIAMTERENE-HCTZ 37.5-25 MG PO CAPS: 1.0000 | ORAL_CAPSULE | Freq: Every day | ORAL | Status: DC

## 2015-02-04 NOTE — Assessment & Plan Note (Signed)
Sub optimal control, and possible ACE allergy, change to maxzide DASH diet and commitment to daily physical activity for a minimum of 30 minutes discussed and encouraged, as a part of hypertension management. The importance of attaining a healthy weight is also discussed.  BP/Weight 02/04/2015 07/24/2014 12/20/2013 05/11/2013 02/06/2013 08/24/2012 09/13/2011  Systolic BP 142 138 140 146 140 138 138  Diastolic BP 80 74 80 80 90 80 72  Wt. (Lbs) 326 310 350.12 347.04 343 339 325  BMI 45.49 43.26 48.85 48.42 47.86 47.3 45.35      Re eval BP in 6 weeks , then 5 months

## 2015-02-04 NOTE — Assessment & Plan Note (Signed)
No current flare, generally symptoms worse in Spring, will use OTC med as needed

## 2015-02-04 NOTE — Assessment & Plan Note (Signed)
Deteriorated. Patient re-educated about  the importance of commitment to a  minimum of 150 minutes of exercise per week.  The importance of healthy food choices with portion control discussed. Encouraged to start a food diary, count calories and to consider  joining a support group. Sample diet sheets offered. Goals set by the patient for the next several months.   Weight /BMI 02/04/2015 07/24/2014 12/20/2013  WEIGHT 326 lb 310 lb 350 lb 1.9 oz  HEIGHT 5\' 11"  5\' 11"  5\' 11"   BMI 45.49 kg/m2 43.26 kg/m2 48.85 kg/m2    Current exercise per week 150 minutes.

## 2015-02-04 NOTE — Patient Instructions (Signed)
Nurse BP check in 6 weeks  MD follow up in 5 month  Stop lisinopril/ hCTZ , start new for BP , traiamterene one daily, start tomorrow  Change diet ,  More vegetable, less candy  For plantar fascitis of left foot, short course of ibuprofen and prednisone sent, and also start twice daily massages with ice as we discussed  Weight loss goal of 16 pounds in next 5 months, which you can do!  [facility], we consider it a privelige to serve you.  All the best for 2017!  Flu vaccine today  Plantar Fasciitis Plantar fasciitis is a painful foot condition that affects the heel. It occurs when the band of tissue that connects the toes to the heel bone (plantar fascia) becomes irritated. This can happen after exercising too much or doing other repetitive activities (overuse injury). The pain from plantar fasciitis can range from mild irritation to severe pain that makes it difficult for you to walk or move. The pain is usually worse in the morning or after you have been sitting or lying down for a while. CAUSES This condition may be caused by:  Standing for long periods of time.  Wearing shoes that do not fit.  Doing high-impact activities, including running, aerobics, and ballet.  Being overweight.  Having an abnormal way of walking (gait).  Having tight calf muscles.  Having high arches in your feet.  Starting a new athletic activity. SYMPTOMS The main symptom of this condition is heel pain. Other symptoms include:  Pain that gets worse after activity or exercise.  Pain that is worse in the morning or after resting.  Pain that goes away after you walk for a few minutes. DIAGNOSIS This condition may be diagnosed based on your signs and symptoms. Your health care provider will also do a physical exam to check for:  A tender area on the bottom of your foot.  A high arch in your foot.  Pain when you move your foot.  Difficulty moving your  foot. You may also need to have imaging studies to confirm the diagnosis. These can include:  X-rays.  Ultrasound.  MRI. TREATMENT  Treatment for plantar fasciitis depends on the severity of the condition. Your treatment may include:  Rest, ice, and over-the-counter pain medicines to manage your pain.  Exercises to stretch your calves and your plantar fascia.  A splint that holds your foot in a stretched, upward position while you sleep (night splint).  Physical therapy to relieve symptoms and prevent problems in the future.  Cortisone injections to relieve severe pain.  Extracorporeal shock wave therapy (ESWT) to stimulate damaged plantar fascia with electrical impulses. It is often used as a last resort before surgery.  Surgery, if other treatments have not worked after 12 months. HOME CARE INSTRUCTIONS  Take medicines only as directed by your health care provider.  Avoid activities that cause pain.  Roll the bottom of your foot over a bag of ice or a bottle of cold water. Do this for 20 minutes, 3-4 times a day.  Perform simple stretches as directed by your health care provider.  Try wearing athletic shoes with air-sole or gel-sole cushions or soft shoe inserts.  Wear a night splint while sleeping, if directed by your health care provider.  Keep all follow-up appointments with your health care provider. PREVENTION   Do not perform exercises or activities that cause heel pain.  Consider finding low-impact activities if you continue to have  problems.  Lose weight if you need to. The best way to prevent plantar fasciitis is to avoid the activities that aggravate your plantar fascia. SEEK MEDICAL CARE IF:  Your symptoms do not go away after treatment with home care measures.  Your pain gets worse.  Your pain affects your ability to move or do your daily activities.   This information is not intended to replace advice given to you by your health care provider.  Make sure you discuss any questions you have with your health care provider.   Document Released: 09/30/2000 Document Revised: 09/26/2014 Document Reviewed: 11/15/2013 Elsevier Interactive Patient Education [employer]

## 2015-02-04 NOTE — Progress Notes (Signed)
Subjective:    Patient ID: Danny Gomez, male    DOB: 09/24/1985, 30 y.o.   MRN: [id number]  HPI   Danny Gomez     MRN: [id number]      DOB: 08/15/1985   HPI Danny Gomez is here for follow up and re-evaluation of chronic medical conditions, medication management and review of any available recent lab and radiology data.  Preventive health is updated, specifically  Cancer screening and Immunization.   Questions or concerns regarding consultations or procedures which the PT has had in the interim are  addressed. The PT is concerned about a possible tickle and dry cough from his ACE inhibitor, blood pressure is uncontrolled , will change med, I explained he MAY have ACE allergy as the cause and best to change to alternate med C/o weight gain, still exercising but not as diligent with diet, will work on this   ROS Denies recent fever or chills. Denies sinus pressure, nasal congestion, ear pain or sore throat. Denies chest congestion, productive cough or wheezing. Denies chest pains, palpitations and leg swelling Denies abdominal pain, nausea, vomiting,diarrhea or constipation.   Denies dysuria, frequency, hesitancy or incontinence. Denies joint pain, swelling and limitation in mobility. Denies headaches, seizures, numbness, or tingling. Denies depression, anxiety or insomnia. Denies skin break down or rash.   PE  BP 142/80 mmHg  Pulse 66  Resp 18  Ht 5\' 11"  (1.803 m)  Wt 326 lb (147.873 kg)  BMI 45.49 kg/m2  SpO2 100%  Patient alert and oriented and in no cardiopulmonary distress.  HEENT: No facial asymmetry, EOMI,   oropharynx pink and moist.  Neck supple no JVD, no mass.  Chest: Clear to auscultation bilaterally.  CVS: S1, S2 no murmurs, no S3.Regular rate.  ABD: Soft non tender.   Ext: No edema  MS: Adequate ROM spine, shoulders, hips and knees.  Skin: Intact, no ulcerations or rash noted.  Psych: Good eye contact, normal affect. Memory intact not anxious  or depressed appearing.  CNS: CN 2-12 intact, power,  normal throughout.no focal deficits noted.   Assessment & Plan   Essential hypertension Sub optimal control, and possible ACE allergy, change to maxzide DASH diet and commitment to daily physical activity for a minimum of 30 minutes discussed and encouraged, as a part of hypertension management. The importance of attaining a healthy weight is also discussed.  BP/Weight 02/04/2015 07/24/2014 12/20/2013 05/11/2013 02/06/2013 08/24/2012 09/13/2011  Systolic BP 142 138 140 146 140 138 138  Diastolic BP 80 74 80 80 90 80 72  Wt. (Lbs) 326 310 350.12 347.04 343 339 325  BMI 45.49 43.26 48.85 48.42 47.86 47.3 45.35      Re eval BP in 6 weeks , then 5 months  Morbid obesity Deteriorated. Patient re-educated about  the importance of commitment to a  minimum of 150 minutes of exercise per week.  The importance of healthy food choices with portion control discussed. Encouraged to start a food diary, count calories and to consider  joining a support group. Sample diet sheets offered. Goals set by the patient for the next several months.   Weight /BMI 02/04/2015 07/24/2014 12/20/2013  WEIGHT 326 lb 310 lb 350 lb 1.9 oz  HEIGHT 5\' 11"  5\' 11"  5\' 11"   BMI 45.49 kg/m2 43.26 kg/m2 48.85 kg/m2    Current exercise per week 150 minutes.   Allergic rhinitis No current flare, generally symptoms worse in Spring, will use OTC med as needed  Review of Systems     Objective:   Physical Exam        Assessment & Plan:

## 2015-02-05 ENCOUNTER — Encounter: Payer: Self-pay | Admitting: Family Medicine

## 2015-02-05 LAB — HIV ANTIBODY (ROUTINE TESTING W REFLEX): HIV 1&2 Ab, 4th Generation: NONREACTIVE

## 2015-02-08 ENCOUNTER — Telehealth: Payer: Self-pay | Admitting: Family Medicine

## 2015-02-08 NOTE — Telephone Encounter (Signed)
wants someone to call about his lab results - to help read them

## 2015-02-10 NOTE — Telephone Encounter (Signed)
pls let him know excellent lab results, normal blood sugar , only thing he  Needs to do is to commit to more regular exercise to improve HDL ( good cholesterpol)

## 2015-02-11 NOTE — Telephone Encounter (Signed)
Patient aware.

## 2015-03-18 ENCOUNTER — Ambulatory Visit: Payer: BLUE CROSS/BLUE SHIELD

## 2015-03-18 VITALS — BP 132/82

## 2015-03-18 DIAGNOSIS — I10 Essential (primary) hypertension: Secondary | ICD-10-CM

## 2015-03-18 NOTE — Progress Notes (Signed)
Patient in for nurse blood pressure check.  Blood pressure checked manually.  Blood pressure within normal range.  Patient will continue to take prescribed medication and will keep next scheduled ov.

## 2015-03-21 ENCOUNTER — Other Ambulatory Visit: Payer: Self-pay | Admitting: Family Medicine

## 2015-03-25 ENCOUNTER — Other Ambulatory Visit: Payer: Self-pay

## 2015-03-25 ENCOUNTER — Telehealth: Payer: Self-pay | Admitting: Family Medicine

## 2015-03-25 ENCOUNTER — Other Ambulatory Visit: Payer: Self-pay | Admitting: Family Medicine

## 2015-03-25 MED ORDER — IBUPROFEN 800 MG PO TABS: 800.0000 mg | ORAL_TABLET | Freq: Three times a day (TID) | ORAL | Status: DC

## 2015-03-25 NOTE — Telephone Encounter (Signed)
Danny Gomez is asking for a refill on ibuprofen (ADVIL,MOTRIN) 800 MG tablet please advise?

## 2015-03-25 NOTE — Telephone Encounter (Signed)
Med refilled x 1  

## 2015-04-15 ENCOUNTER — Other Ambulatory Visit: Payer: Self-pay | Admitting: Family Medicine

## 2015-07-08 ENCOUNTER — Encounter: Payer: Self-pay | Admitting: Family Medicine

## 2015-07-08 ENCOUNTER — Ambulatory Visit: Payer: BLUE CROSS/BLUE SHIELD | Admitting: Family Medicine

## 2015-08-11 ENCOUNTER — Other Ambulatory Visit: Payer: Self-pay | Admitting: Family Medicine

## 2015-08-12 ENCOUNTER — Other Ambulatory Visit: Payer: Self-pay

## 2015-08-12 MED ORDER — TRIAMTERENE-HCTZ 37.5-25 MG PO CAPS: 1.0000 | ORAL_CAPSULE | Freq: Every day | ORAL | 4 refills | Status: DC

## 2015-08-21 ENCOUNTER — Encounter: Payer: Self-pay | Admitting: Family Medicine

## 2015-08-21 ENCOUNTER — Telehealth: Payer: Self-pay | Admitting: Family Medicine

## 2015-08-21 ENCOUNTER — Ambulatory Visit (INDEPENDENT_AMBULATORY_CARE_PROVIDER_SITE_OTHER): Payer: BLUE CROSS/BLUE SHIELD | Admitting: Family Medicine

## 2015-08-21 VITALS — BP 130/78 | HR 84 | Resp 18 | Ht 71.0 in | Wt 324.0 lb

## 2015-08-21 DIAGNOSIS — I10 Essential (primary) hypertension: Secondary | ICD-10-CM

## 2015-08-21 DIAGNOSIS — R7302 Impaired glucose tolerance (oral): Secondary | ICD-10-CM | POA: Diagnosis not present

## 2015-08-21 DIAGNOSIS — M79672 Pain in left foot: Secondary | ICD-10-CM | POA: Diagnosis not present

## 2015-08-21 DIAGNOSIS — E559 Vitamin D deficiency, unspecified: Secondary | ICD-10-CM

## 2015-08-21 MED ORDER — METHYLPREDNISOLONE ACETATE 80 MG/ML IJ SUSP
80.0000 mg | Freq: Once | INTRAMUSCULAR | Status: AC
  Administered 2015-08-21: 80 mg via INTRAMUSCULAR

## 2015-08-21 MED ORDER — LISINOPRIL-HYDROCHLOROTHIAZIDE 20-25 MG PO TABS: 1.0000 | ORAL_TABLET | Freq: Every day | ORAL | 3 refills | Status: DC

## 2015-08-21 MED ORDER — KETOROLAC TROMETHAMINE 60 MG/2ML IM SOLN
60.0000 mg | Freq: Once | INTRAMUSCULAR | Status: AC
  Administered 2015-08-21: 60 mg via INTRAMUSCULAR

## 2015-08-21 MED ORDER — IBUPROFEN 800 MG PO TABS: 800.0000 mg | ORAL_TABLET | Freq: Three times a day (TID) | ORAL | 0 refills | Status: DC

## 2015-08-21 MED ORDER — PREDNISONE 5 MG PO TABS: 5.0000 mg | ORAL_TABLET | Freq: Two times a day (BID) | ORAL | 0 refills | Status: AC

## 2015-08-21 NOTE — Assessment & Plan Note (Addendum)
Acute left pain x 4 days, no trauma, will send short sharp anti inflammatory ,and injections administered IM in office , work excuse for 1 day No history or clinical evidence of fracture , weight loss will be beneficial , stands at least 5 hrs per day

## 2015-08-21 NOTE — Patient Instructions (Addendum)
F/U end Jan, call if you need me e before  Fasting lipid, cmp, hBA1C tsh, CBC Jan 17 or after  Work excuse for today   Injections and medications sent in today for acute left foot pain, call back if not resolved in 1 week  Please work on good  health habits so that your health will improve. 1. Commitment to daily physical activity for 30 to 60  minutes, if you are able to do this.  2. Commitment to wise food choices. Aim for half of your  food intake to be vegetable and fruit, one quarter starchy foods, and one quarter protein. Try to eat on a regular schedule  3 meals per day, snacking between meals should be limited to vegetables or fruits or small portions of nuts. 64 ounces of water per day is generally recommended, unless you have specific health conditions, like heart failure or kidney failure where you will need to limit fluid intake.  3. Commitment to sufficient and a  good quality of physical and mental rest daily, generally between 6 to 8 hours per day.  WITH PERSISTANCE AND PERSEVERANCE, THE IMPOSSIBLE , BECOMES THE NORM!   Thank you  for choosing [facility]. We consider it a privelige to serve you.  Delivering excellent health care in a caring and  compassionate way is our goal.  Partnering with you,  so that together we can achieve this goal is our strategy.

## 2015-08-21 NOTE — Telephone Encounter (Signed)
Danny Gomez is asking for a return call from the nurse regarding his left foot, please advise?

## 2015-08-21 NOTE — Telephone Encounter (Signed)
Patient in for ov  

## 2015-08-25 NOTE — Progress Notes (Signed)
Danny Gomez     MRN: [id number]      DOB: 04/08/1985   HPI Danny Gomez is here for follow up and re-evaluation of chronic medical conditions, medication management and review of any available recent lab and radiology data.  Preventive health is updated, specifically  Cancer screening and Immunization.   . Danny Gomez denies any adverse reactions to current medications since Danny last visit.  3 day h/o left foot pain across top of foot,  No recent direct trauma, no skin breakdown or erythema ROS Denies recent fever or chills. Denies sinus pressure, nasal congestion, ear pain or sore throat. Denies chest congestion, productive cough or wheezing. Denies chest pains, palpitations and leg swelling Denies abdominal pain, nausea, vomiting,diarrhea or constipation.   Denies dysuria, frequency, hesitancy or incontinence.  Denies headaches, seizures, numbness, or tingling. Denies depression, anxiety or insomnia.  PE  BP 130/78   Pulse 84   Resp 18   Ht 5\' 11"  (1.803 m)   Wt (!) 324 lb (147 kg)   SpO2 99%   BMI 45.19 kg/m   Patient alert and oriented and in no cardiopulmonary distress.  HEENT: No facial asymmetry, EOMI,   oropharynx pink and moist.  Neck supple no JVD, no mass.  Chest: Clear to auscultation bilaterally.  CVS: S1, S2 no murmurs, no S3.Regular rate.  ABD: Soft non tender.   Ext: No edema  MS: Adequate ROM spine, shoulders, hips and knees. Tender on palpation lateral aspect of dorsum of left foot along tendon Skin: Intact, no ulcerations or rash noted.  Psych: Good eye contact, normal affect. Memory intact not anxious or depressed appearing.  CNS: CN 2-12 intact, power,  normal throughout.no focal deficits noted.   Assessment & Plan  Foot pain, left Acute left pain x 4 days, no trauma, will send short sharp anti inflammatory ,and injections administered IM in office , work excuse for 1 day No history or clinical evidence of fracture , weight loss will be  beneficial , stands at least 5 hrs per day  IGT (impaired glucose tolerance) Patient educated about Danny importance of limiting  Carbohydrate intake , Danny need to commit to daily physical activity for a minimum of 30 minutes , and to commit weight loss. Danny fact that changes in all these areas will reduce or eliminate all together Danny development of diabetes is stressed.   Diabetic Labs Latest Ref Rng & Units 02/04/2015 12/20/2013 08/31/2012 06/30/2011 03/09/2011  HbA1c <5.7 % 5.6 5.8(H) 5.5 - 5.6  Chol 125 - 200 mg/dL 409156 - 811153 - 914147  HDL >=78>=40 mg/dL 29(F37(L) - 62(Z34(L) - 30(Q33(L)  Calc LDL <130 mg/dL 657106 - 93 - 846(N101(H)  Triglycerides <150 mg/dL 64 - 629132 - 65  Creatinine 0.60 - 1.35 mg/dL 5.280.80 4.130.82 2.440.82 0.100.86 2.720.87   BP/Weight 08/21/2015 03/18/2015 02/04/2015 07/24/2014 12/20/2013 05/11/2013 02/06/2013  Systolic BP 130 132 142 138 140 146 140  Diastolic BP 78 82 80 74 80 80 90  Wt. (Lbs) 324 - 326 310 350.12 347.04 343  BMI 45.19 - 45.49 43.26 48.85 48.42 47.86   No flowsheet data found.  Updated lab needed at/ before next visit.   Essential hypertension Controlled, no change in medication DASH diet and commitment to daily physical activity for a minimum of 30 minutes discussed and encouraged, as a part of hypertension management. Danny importance of attaining a healthy weight is also discussed.  BP/Weight 08/21/2015 03/18/2015 02/04/2015 07/24/2014 12/20/2013 05/11/2013 02/06/2013  Systolic BP 130  132 142 138 140 146 140  Diastolic BP 78 82 80 74 80 80 90  Wt. (Lbs) 324 - 326 310 350.12 347.04 343  BMI 45.19 - 45.49 43.26 48.85 48.42 47.86       Morbid obesity Deteriorated. Patient re-educated about  Danny importance of commitment to a  minimum of 150 minutes of exercise per week.  Danny importance of healthy food choices with portion control discussed. Encouraged to start a food diary, count calories and to consider  joining a support group. Sample diet sheets offered. Goals set by Danny patient for Danny next  several months.   Weight /BMI 08/21/2015 02/04/2015 07/24/2014  WEIGHT 324 lb 326 lb 310 lb  HEIGHT     BMI 45.19 kg/m2 45.49 kg/m2 43.26 kg/m2      Vitamin D deficiency Updated lab needed at/ before next visit.

## 2015-08-25 NOTE — Assessment & Plan Note (Signed)
Patient educated about the importance of limiting  Carbohydrate intake , the need to commit to daily physical activity for a minimum of 30 minutes , and to commit weight loss. The fact that changes in all these areas will reduce or eliminate all together the development of diabetes is stressed.   Diabetic Labs Latest Ref Rng & Units 02/04/2015 12/20/2013 08/31/2012 06/30/2011 03/09/2011  HbA1c <5.7 % 5.6 5.8(H) 5.5 - 5.6  Chol 125 - 200 mg/dL 161156 - 096153 - 045147  HDL >=40>=40 mg/dL 98(J37(L) - 19(J34(L) - 47(W33(L)  Calc LDL <130 mg/dL 295106 - 93 - 621(H101(H)  Triglycerides <150 mg/dL 64 - 086132 - 65  Creatinine 0.60 - 1.35 mg/dL 5.780.80 4.690.82 6.290.82 5.280.86 4.130.87   BP/Weight 08/21/2015 03/18/2015 02/04/2015 07/24/2014 12/20/2013 05/11/2013 02/06/2013  Systolic BP 130 132 142 138 140 146 140  Diastolic BP 78 82 80 74 80 80 90  Wt. (Lbs) 324 - 326 310 350.12 347.04 343  BMI 45.19 - 45.49 43.26 48.85 48.42 47.86   No flowsheet data found.  Updated lab needed at/ before next visit.

## 2015-08-25 NOTE — Assessment & Plan Note (Signed)
Deteriorated. Patient re-educated about  the importance of commitment to a  minimum of 150 minutes of exercise per week.  The importance of healthy food choices with portion control discussed. Encouraged to start a food diary, count calories and to consider  joining a support group. Sample diet sheets offered. Goals set by the patient for the next several months.   Weight /BMI 08/21/2015 02/04/2015 07/24/2014  WEIGHT 324 lb 326 lb 310 lb  HEIGHT 5\' 11"  5\' 11"  5\' 11"   BMI 45.19 kg/m2 45.49 kg/m2 43.26 kg/m2

## 2015-08-25 NOTE — Assessment & Plan Note (Signed)
Updated lab needed at/ before next visit.   

## 2015-08-25 NOTE — Assessment & Plan Note (Signed)
Controlled, no change in medication DASH diet and commitment to daily physical activity for a minimum of 30 minutes discussed and encouraged, as a part of hypertension management. The importance of attaining a healthy weight is also discussed.  BP/Weight 08/21/2015 03/18/2015 02/04/2015 07/24/2014 12/20/2013 05/11/2013 02/06/2013  Systolic BP 130 132 142 138 140 146 140  Diastolic BP 78 82 80 74 80 80 90  Wt. (Lbs) 324 - 326 310 350.12 347.04 343  BMI 45.19 - 45.49 43.26 48.85 48.42 47.86

## 2015-12-05 ENCOUNTER — Ambulatory Visit (INDEPENDENT_AMBULATORY_CARE_PROVIDER_SITE_OTHER): Payer: BLUE CROSS/BLUE SHIELD | Admitting: Family Medicine

## 2015-12-05 ENCOUNTER — Encounter: Payer: Self-pay | Admitting: Family Medicine

## 2015-12-05 VITALS — BP 158/84 | HR 96 | Temp 99.7°F | Resp 20 | Ht 71.0 in | Wt 331.0 lb

## 2015-12-05 DIAGNOSIS — J029 Acute pharyngitis, unspecified: Secondary | ICD-10-CM | POA: Insufficient documentation

## 2015-12-05 DIAGNOSIS — J028 Acute pharyngitis due to other specified organisms: Secondary | ICD-10-CM

## 2015-12-05 DIAGNOSIS — I10 Essential (primary) hypertension: Secondary | ICD-10-CM | POA: Diagnosis not present

## 2015-12-05 LAB — POCT RAPID STREP A (OFFICE): RAPID STREP A SCREEN: NEGATIVE

## 2015-12-05 MED ORDER — CEFTRIAXONE SODIUM 500 MG IJ SOLR
500.0000 mg | Freq: Once | INTRAMUSCULAR | Status: AC
  Administered 2015-12-05: 500 mg via INTRAMUSCULAR

## 2015-12-05 MED ORDER — FIRST-DUKES MOUTHWASH MT SUSP: OROMUCOSAL | 0 refills | Status: DC

## 2015-12-05 MED ORDER — PENICILLIN V POTASSIUM 125 MG/5ML PO SOLR: ORAL | 0 refills | Status: DC

## 2015-12-05 NOTE — Assessment & Plan Note (Addendum)
4 day h/o acute sore throat, tender swollen rigth lymph node , fever. Rapid strep negative. Rocephin IM followed by antibiotic course. Work excuse x 1 week Pt ed, fluids and rest, call if worsening

## 2015-12-05 NOTE — Patient Instructions (Addendum)
F/u in 2 month, cll if you need me sooner  Fasting lipid, chem 7, CBC in 2 months, 3 days befoe  visit  Work excuse from 11/14 to return 12/09/2015  You have acute pharyngitis and swollen right lymph gland.  FLUID, rest, tylenol for pain and fever , up to four tabs per 24 hours  Rocephin given ion office, penicillin liquid and mouthwash for sore throat are prescribed     Sore Throat When you have a sore throat, your throat may:  Hurt.  Burn.  Feel irritated.  Feel scratchy. Many things can cause a sore throat, including:  An infection.  Allergies.  Dryness in the air.  Smoke or pollution.  Gastroesophageal reflux disease (GERD).  A tumor. A sore throat can be the first sign of another sickness. It can happen with other problems, like coughing or a fever. Most sore throats go away without treatment. Follow these instructions at home:  Take over-the-counter medicines only as told by your doctor.  Drink enough fluids to keep your pee (urine) clear or pale yellow.  Rest when you feel you need to.  To help with pain, try:  Sipping warm liquids, such as broth, herbal tea, or warm water.  Eating or drinking cold or frozen liquids, such as frozen ice pops.  Gargling with a salt-water mixture 3-4 times a day or as needed. To make a salt-water mixture, add -1 tsp of salt in 1 cup of warm water. Mix it until you cannot see the salt anymore.  Sucking on hard candy or throat lozenges.  Putting a cool-mist humidifier in your bedroom at night.  Sitting in the bathroom with the door closed for 5-10 minutes while you run hot water in the shower.  Do not use any tobacco products, such as cigarettes, chewing tobacco, and e-cigarettes. If you need help quitting, ask your doctor. Contact a doctor if:  You have a fever for more than 2-3 days.  You keep having symptoms for more than 2-3 days.  Your throat does not get better in 7 days.  You have a fever and your  symptoms suddenly get worse. Get help right away if:  You have trouble breathing.  You cannot swallow fluids, soft foods, or your saliva.  You have swelling in your throat or neck that gets worse.  You keep feeling like you are going to throw up (vomit).  You keep throwing up. This information is not intended to replace advice given to you by your health care provider. Make sure you discuss any questions you have with your health care provider. Document Released: 10/15/2007 Document Revised: 09/01/2015 Document Reviewed: 10/26/2014 Elsevier Interactive Patient Education  2017 [employer]

## 2015-12-09 ENCOUNTER — Ambulatory Visit: Payer: BLUE CROSS/BLUE SHIELD | Admitting: Family Medicine

## 2015-12-09 NOTE — Assessment & Plan Note (Signed)
Deteriorated. Patient re-educated about  the importance of commitment to a  minimum of 150 minutes of exercise per week.  The importance of healthy food choices with portion control discussed. Encouraged to start a food diary, count calories and to consider  joining a support group. Sample diet sheets offered. Goals set by the patient for the next several months.   Weight /BMI 12/05/2015 08/21/2015 02/04/2015  WEIGHT 331 lb 0.6 oz 324 lb 326 lb  HEIGHT 5\' 11"  5\' 11"  5\' 11"   BMI 46.17 kg/m2 45.19 kg/m2 45.49 kg/m2

## 2015-12-09 NOTE — Assessment & Plan Note (Signed)
Elevated at visit , however has not taken med today due to painful swallowing DASH diet and commitment to daily physical activity for a minimum of 30 minutes discussed and encouraged, as a part of hypertension management. The importance of attaining a healthy weight is also discussed.  BP/Weight 12/05/2015 08/21/2015 03/18/2015 02/04/2015 07/24/2014 12/20/2013 05/11/2013  Systolic BP 158 130 132 142 138 140 146  Diastolic BP 84 78 82 80 74 80 80  Wt. (Lbs) 331.04 324 - 326 310 350.12 347.04  BMI 46.17 45.19 - 45.49 43.26 48.85 48.42

## 2015-12-09 NOTE — Progress Notes (Signed)
   Danny Gomez     MRN: [id number]      DOB: 11/29/1985   HPI Mr. 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 Has not had BP med today, painful swallowing ROS . Denies sinus pressure, nasal congestion, ear pain Denies chest congestion, productive cough or wheezing. Denies chest pains, palpitations and leg swelling Denies abdominal pain, nausea, vomiting,diarrhea or constipation.   Denies dysuria, frequency, hesitancy or incontinence. Denies joint pain, swelling and limitation in mobility. Denies headaches, seizures, numbness, or tingling. Denies depression, anxiety or insomnia. Denies skin break down or rash.   PE  BP (!) 158/84 (BP Location: Right Arm, Patient Position: Sitting, Cuff Size: Large)   Pulse 96   Temp 99.7 F (37.6 C) (Oral)   Resp 20   Ht 5\' 11"  (1.803 m)   Wt (!) 331 lb 0.6 oz (150.2 kg)   SpO2 99%   BMI 46.17 kg/m   Patient alert and oriented and in no cardiopulmonary distress.Ill appearing and in pain  HEENT: No facial asymmetry,decreased though adequate ROM, enlarged right anterior cervical node , tender no JVD, no mass.Oropharynx erythema , no exudate , no tonsils  Chest: Clear to auscultation bilaterally.  CVS: S1, S2 no murmurs, no S3.Regular rate.  ABD: Soft non tender.   Ext: No edema  MS: Adequate ROM spine, shoulders, hips and knees.  Skin: Intact, no ulcerations or rash noted.  Psych: Good eye contact, normal affect. Memory intact not anxious or depressed appearing.  CNS: CN 2-12 intact, power,  normal throughout.no focal deficits noted.   Assessment & Plan  Acute pharyngitis 4 day h/o acute sore throat, tender swollen rigth lymph node , fever. Rapid strep negative. Rocephin IM followed by antibiotic course. Work excuse x 1 week Pt ed, fluids and rest, call if  worsening  Essential hypertension Elevated at visit , however has not taken med today due to painful swallowing DASH diet and commitment to daily physical activity for a minimum of 30 minutes discussed and encouraged, as a part of hypertension management. The importance of attaining a healthy weight is also discussed.  BP/Weight 12/05/2015 08/21/2015 03/18/2015 02/04/2015 07/24/2014 12/20/2013 05/11/2013  Systolic BP 158 130 132 142 138 140 146  Diastolic BP 84 78 82 80 74 80 80  Wt. (Lbs) 331.04 324 - 326 310 350.12 347.04  BMI 46.17 45.19 - 45.49 43.26 48.85 48.42       Morbid obesity Deteriorated. Patient re-educated about  the importance of commitment to a  minimum of 150 minutes of exercise per week.  The importance of healthy food choices with portion control discussed. Encouraged to start a food diary, count calories and to consider  joining a support group. Sample diet sheets offered. Goals set by the patient for the next several months.   Weight /BMI 12/05/2015 08/21/2015 02/04/2015  WEIGHT 331 lb 0.6 oz 324 lb 326 lb  HEIGHT 5\' 11"  5\' 11"  5\' 11"   BMI 46.17 kg/m2 45.19 kg/m2 45.49 kg/m2

## 2016-01-29 ENCOUNTER — Telehealth: Payer: Self-pay | Admitting: Family Medicine

## 2016-01-29 NOTE — Telephone Encounter (Signed)
Danny Gomez is c/o nasal congestion, coughing, fever of 101 feeling crappy all of a sudden again, symptoms since last night, please advise?

## 2016-01-29 NOTE — Telephone Encounter (Signed)
I offered him an appointment tomorrow with Dr. Delton SeeNelson and he declined stating he wasn't taking off of work tomorrow he was actually feeling some better

## 2016-01-29 NOTE — Telephone Encounter (Signed)
Noted  

## 2016-01-31 ENCOUNTER — Encounter: Payer: Self-pay | Admitting: Family Medicine

## 2016-01-31 ENCOUNTER — Ambulatory Visit (INDEPENDENT_AMBULATORY_CARE_PROVIDER_SITE_OTHER): Payer: BLUE CROSS/BLUE SHIELD | Admitting: Family Medicine

## 2016-01-31 VITALS — BP 148/84 | HR 72 | Temp 97.5°F | Resp 18 | Ht 71.0 in | Wt 346.0 lb

## 2016-01-31 DIAGNOSIS — B9789 Other viral agents as the cause of diseases classified elsewhere: Secondary | ICD-10-CM

## 2016-01-31 DIAGNOSIS — J069 Acute upper respiratory infection, unspecified: Secondary | ICD-10-CM | POA: Diagnosis not present

## 2016-01-31 DIAGNOSIS — L309 Dermatitis, unspecified: Secondary | ICD-10-CM | POA: Diagnosis not present

## 2016-01-31 DIAGNOSIS — M25561 Pain in right knee: Secondary | ICD-10-CM

## 2016-01-31 LAB — LIPID PANEL
CHOL/HDL RATIO: 4.9 ratio (ref <5.0)
Cholesterol: 147 mg/dL (ref <200)
HDL: 30 mg/dL — ABNORMAL LOW (ref >40)
LDL Cholesterol: 93 mg/dL (ref <100)
Triglycerides: 120 mg/dL (ref <150)
VLDL: 24 mg/dL (ref <30)

## 2016-01-31 LAB — CBC
HCT: 47.1 % (ref 38.5–50.0)
Hemoglobin: 16.2 g/dL (ref 13.2–17.1)
MCH: 29.6 pg (ref 27.0–33.0)
MCHC: 34.4 g/dL (ref 32.0–36.0)
MCV: 85.9 fL (ref 80.0–100.0)
MPV: 9.3 fL (ref 7.5–12.5)
PLATELETS: 265 10*3/uL (ref 140–400)
RBC: 5.48 MIL/uL (ref 4.20–5.80)
RDW: 14.9 % (ref 11.0–15.0)
WBC: 7.7 10*3/uL (ref 3.8–10.8)

## 2016-01-31 LAB — BASIC METABOLIC PANEL
BUN: 18 mg/dL (ref 7–25)
CALCIUM: 9.3 mg/dL (ref 8.6–10.3)
CHLORIDE: 101 mmol/L (ref 98–110)
CO2: 25 mmol/L (ref 20–31)
CREATININE: 0.83 mg/dL (ref 0.60–1.35)
GLUCOSE: 77 mg/dL (ref 65–99)
Potassium: 4.4 mmol/L (ref 3.5–5.3)
Sodium: 137 mmol/L (ref 135–146)

## 2016-01-31 MED ORDER — HYDROCORTISONE 1 % EX CREA: 1.0000 "application " | TOPICAL_CREAM | Freq: Two times a day (BID) | CUTANEOUS | 0 refills | Status: DC

## 2016-01-31 MED ORDER — PROMETHAZINE-PHENYLEPHRINE 6.25-5 MG/5ML PO SYRP: 10.0000 mL | ORAL_SOLUTION | ORAL | 0 refills | Status: DC | PRN

## 2016-01-31 NOTE — Progress Notes (Signed)
Chief Complaint  Patient presents with  . URI    x 4 days   Primary care doctor is Syliva OvermanMargaret Simpson M.D. Patient is here for an acute visit for a respiratory infection. His runny and stuffy nose, clear mucus, postnasal drip, cough, scratchy throat and low-grade fever. Symptoms present for 4 days. No malaise sweats or chills. No chest pain. No purulent sputum.  In addition patient states he has had knee pain in the right knee for a couple of months. It appears to be getting worse over time. No trauma or injury. It hurts with forced flexion and going up steps. No buckling or locking. No swelling. No trauma.  In addition patient mentions that he has a pruritic rash around the right side of his neck. It's been present for 2 days.   Patient Active Problem List   Diagnosis Date Noted  . Acute pharyngitis 12/05/2015  . IGT (impaired glucose tolerance) 02/04/2015  . Vitamin D deficiency 02/06/2013  . Allergic rhinitis 10/29/2009  . Morbid obesity (HCC) 08/26/2007  . Essential hypertension 08/26/2007    Outpatient Encounter Prescriptions as of 01/31/2016  Medication Sig  . ibuprofen (ADVIL,MOTRIN) 800 MG tablet Take 1 tablet (800 mg total) by mouth 3 (three) times daily.  [name] Kitchen. lisinopril-hydrochlorothiazide (PRINZIDE,ZESTORETIC) 20-25 MG tablet Take 1 tablet by mouth daily.  . hydrocortisone cream 1 % Apply 1 application topically 2 (two) times daily.  . promethazine-phenylephrine (PROMETHAZINE VC PLAIN) 6.25-5 MG/5ML SYRP Take 10 mLs by mouth every 4 (four) hours as needed for congestion.  . [DISCONTINUED] Diphenhyd-Hydrocort-Nystatin (FIRST-DUKES MOUTHWASH) SUSP 10 ml three times daily, gargle , swish and swallow (Patient not taking: Reported on 01/31/2016)  . [DISCONTINUED] penicillin potassium (VEETID) 125 MG/5ML solution Take 10 ml four times daily for 10 days (Patient not taking: Reported on 01/31/2016)   No facility-administered encounter medications on file as of 01/31/2016.     No  Known Allergies  Review of Systems  Constitutional: Positive for unexpected weight change. Negative for chills and fatigue.       Patient has gained 14 pounds since last visit. Discussed.  HENT: Positive for congestion, postnasal drip, rhinorrhea, sinus pain and sinus pressure. Negative for ear pain, sore throat and trouble swallowing.   Eyes: Negative for redness and visual disturbance.  Respiratory: Positive for cough. Negative for shortness of breath and wheezing.   Cardiovascular: Negative for chest pain and palpitations.  Gastrointestinal: Negative for nausea and vomiting.  Genitourinary: Negative for difficulty urinating and frequency.  Musculoskeletal: Positive for arthralgias and gait problem.  Skin: Positive for rash.  Neurological: Negative for dizziness and headaches.  Psychiatric/Behavioral: Negative.      BP (!) 148/84 (BP Location: Right Arm, Patient Position: Sitting, Cuff Size: Normal)   Pulse 72   Temp 97.5 F (36.4 C) (Oral)   Resp 18   Ht 5\' 11"  (1.803 m)   Wt (!) 346 lb 0.6 oz (157 kg)   SpO2 98%   BMI 48.26 kg/m   Physical Exam  Constitutional: He is oriented to person, place, and time. He appears well-developed and well-nourished.  HENT:  Head: Normocephalic and atraumatic.  Right Ear: External ear normal.  Left Ear: External ear normal.  Mouth/Throat: Oropharynx is clear and moist.  Right tympanic membrane slightly injected. Posterior pharynx mildly injected. Nasal membranes swollen and pink. Clear drainage. No sinus tenderness.  Eyes: Conjunctivae are normal. Pupils are equal, round, and reactive to light.  Neck: Normal range of motion.  Cardiovascular: Normal rate,  regular rhythm and normal heart sounds.   Pulmonary/Chest: Effort normal and breath sounds normal. No respiratory distress.  Abdominal: Soft. Bowel sounds are normal.  Musculoskeletal:  Right knee has full range of motion. No crepitus. No instability. No warmth. Mild tenderness popliteal  fossa and lateral knee.  Lymphadenopathy:    He has no cervical adenopathy.  Neurological: He is alert and oriented to person, place, and time.  Skin: There is erythema.  Fine erythematous macular rash right neck,? Contact dermatitis  Psychiatric: He has a normal mood and affect. His behavior is normal.    ASSESSMENT/PLAN:  1. Viral upper respiratory infection Discussed the importance of avoiding antibiotics for viral infections and symptomatic care  2. Dermatitis Cortisone cream  3. Right knee pain, unspecified chronicity Unclear etiology, we'll get an x-ray and then possibly referred to orthopedics - DG Knee Complete 4 Views Right; Future   Patient Instructions  Rest Push fluids Take cough medicine as needed Cream for rash until clears Knee x ray ordered I will send you a letter with your test results.  If there is anything of concern, we will call right away.    Eustace Moore, MD

## 2016-01-31 NOTE — Patient Instructions (Signed)
Rest Push fluids Take cough medicine as needed Cream for rash until clears Knee x ray ordered I will send you a letter with your test results.  If there is anything of concern, we will call right away.

## 2016-02-02 ENCOUNTER — Encounter: Payer: Self-pay | Admitting: Family Medicine

## 2016-05-19 ENCOUNTER — Ambulatory Visit: Payer: BLUE CROSS/BLUE SHIELD | Admitting: Family Medicine

## 2016-10-03 ENCOUNTER — Other Ambulatory Visit: Payer: Self-pay | Admitting: Family Medicine

## 2016-10-05 NOTE — Telephone Encounter (Signed)
Seen 11 2 18 

## 2017-01-07 ENCOUNTER — Telehealth: Payer: Self-pay | Admitting: Family Medicine

## 2017-01-07 ENCOUNTER — Other Ambulatory Visit: Payer: Self-pay | Admitting: Family Medicine

## 2017-01-07 MED ORDER — PREDNISONE 5 MG (21) PO TBPK: 5.0000 mg | ORAL_TABLET | ORAL | 0 refills | Status: DC

## 2017-01-07 NOTE — Telephone Encounter (Signed)
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 Not much radiating into his extremities but can feel it go down his right leg some when he tries to tie his shoes. Rates pain 5/10. Not getting better and has taken meloxicam and flexeril with little relief. Please advise

## 2017-01-07 NOTE — Telephone Encounter (Signed)
Pt has twisted and hurt his back, The mom has given Flexarill, meloxicam, and that is not working. [phone]. Can you call him in something, or suggest what to do.

## 2017-01-07 NOTE — Telephone Encounter (Signed)
I spoke with patient and prednisone dose pack is prescribed, he also knows he needs to come in for an appointment

## 2017-01-15 ENCOUNTER — Telehealth: Payer: Self-pay | Admitting: Family Medicine

## 2017-01-15 NOTE — Telephone Encounter (Signed)
appt scheduled

## 2017-01-15 NOTE — Telephone Encounter (Signed)
Mom is calling in Pt called his grandmother at 3am this morning complaining of his back, just to lay it hurts.

## 2017-01-20 ENCOUNTER — Emergency Department (HOSPITAL_COMMUNITY)
Admission: EM | Admit: 2017-01-20 | Discharge: 2017-01-20 | Disposition: A | Discharge status: Home / Self Care | Payer: BLUE CROSS/BLUE SHIELD | Attending: Emergency Medicine | Admitting: Emergency Medicine

## 2017-01-20 ENCOUNTER — Telehealth: Payer: Self-pay | Admitting: Family Medicine

## 2017-01-20 ENCOUNTER — Encounter (HOSPITAL_COMMUNITY): Payer: Self-pay | Admitting: Emergency Medicine

## 2017-01-20 ENCOUNTER — Other Ambulatory Visit: Payer: Self-pay

## 2017-01-20 DIAGNOSIS — I10 Essential (primary) hypertension: Secondary | ICD-10-CM | POA: Diagnosis not present

## 2017-01-20 DIAGNOSIS — Z79899 Other long term (current) drug therapy: Secondary | ICD-10-CM | POA: Insufficient documentation

## 2017-01-20 DIAGNOSIS — M5441 Lumbago with sciatica, right side: Secondary | ICD-10-CM | POA: Diagnosis not present

## 2017-01-20 DIAGNOSIS — M545 Low back pain: Secondary | ICD-10-CM | POA: Diagnosis present

## 2017-01-20 MED ORDER — LIDOCAINE 5 % EX PTCH: 1.0000 | MEDICATED_PATCH | CUTANEOUS | 0 refills | Status: DC

## 2017-01-20 MED ORDER — PREDNISONE 20 MG PO TABS
60.0000 mg | ORAL_TABLET | Freq: Once | ORAL | Status: AC
  Administered 2017-01-20: 60 mg via ORAL
  Filled 2017-01-20: qty 3

## 2017-01-20 MED ORDER — PREDNISONE 10 MG PO TABS: 50.0000 mg | ORAL_TABLET | Freq: Every day | ORAL | 0 refills | Status: DC

## 2017-01-20 MED ORDER — OXYCODONE-ACETAMINOPHEN 5-325 MG PO TABS
1.0000 | ORAL_TABLET | Freq: Once | ORAL | Status: AC
  Administered 2017-01-20: 1 via ORAL
  Filled 2017-01-20: qty 1

## 2017-01-20 MED ORDER — HYDROCHLOROTHIAZIDE 25 MG PO TABS
25.0000 mg | ORAL_TABLET | Freq: Once | ORAL | Status: AC
  Administered 2017-01-20: 25 mg via ORAL
  Filled 2017-01-20: qty 1

## 2017-01-20 MED ORDER — METHOCARBAMOL 500 MG PO TABS: 500.0000 mg | ORAL_TABLET | Freq: Three times a day (TID) | ORAL | 0 refills | Status: DC | PRN

## 2017-01-20 MED ORDER — NAPROXEN 250 MG PO TABS: 500.0000 mg | ORAL_TABLET | Freq: Two times a day (BID) | ORAL | 0 refills | Status: DC

## 2017-01-20 MED ORDER — LISINOPRIL 20 MG PO TABS
20.0000 mg | ORAL_TABLET | Freq: Once | ORAL | Status: AC
  Administered 2017-01-20: 20 mg via ORAL
  Filled 2017-01-20: qty 1

## 2017-01-20 NOTE — ED Notes (Signed)
ED Provider at bedside. 

## 2017-01-20 NOTE — Telephone Encounter (Signed)
Patient is requesting a call from you regarding his upcoming appt.

## 2017-01-20 NOTE — ED Triage Notes (Signed)
Pt. Stated, I started having lower back pain a week before christmas, given some PREDNISONE WENT AWAY BUT WHEN I FINISHED IT CAME RIGHT BACK.

## 2017-01-20 NOTE — Telephone Encounter (Signed)
Scheduled for tomorrow.

## 2017-01-20 NOTE — ED Provider Notes (Signed)
[facility] EMERGENCY DEPARTMENT Provider Note   CSN: [id number] Arrival date & time: 01/20/17  1737     History   Chief Complaint Chief Complaint  Patient presents with  . Back Pain  . Leg Pain    HPI Danny Gomez is a 32 y.o. male with a hx of HTN and obesity who presents to the emergency department complaining of right lower back pain radiating down the right lower extremity x 3 weeks.  Patient describes the pain as an aching to his right lower back with sharp pain down the right lower extremity.  States his pain is a 7 out of 10 at present, worse with standing upright and walking and better with laying supine.  Patient had a steroid taper by PCP 12/19-12/25 with significant improvement, however symptoms recurred.  At home he has been trying Tylenol, ibuprofen, and topical lidocaine gels without significant relief.  Initial symptoms onset was following bending over twisting motion. Denies numbness, tingling, weakness, incontinence to bowel/bladder, fever, chills, IV drug use, or hx of cancer. Patient has PCP appointment tomorrow. He also had not had his antihypertensive medication today.   HPI  Past Medical History:  Diagnosis Date  . Febrile illness   . Headache(784.0)   . Hypertension   . Obesity     Patient Active Problem List   Diagnosis Date Noted  . Acute pharyngitis 12/05/2015  . IGT (impaired glucose tolerance) 02/04/2015  . Vitamin D deficiency 02/06/2013  . Allergic rhinitis 10/29/2009  . Morbid obesity (HCC) 08/26/2007  . Essential hypertension 08/26/2007    Past Surgical History:  Procedure Laterality Date  . right ankle surgery  2002  . TONSILLECTOMY         Home Medications    Prior to Admission medications   Medication Sig Start Date End Date Taking? Authorizing Provider  hydrocortisone cream 1 % Apply 1 application topically 2 (two) times daily. 01/31/16   Eustace Moore, MD  ibuprofen (ADVIL,MOTRIN) 800 MG tablet Take 1  tablet (800 mg total) by mouth 3 (three) times daily. 08/21/15   Kerri Perches, MD  lisinopril-hydrochlorothiazide (PRINZIDE,ZESTORETIC) 20-25 MG tablet TAKE ONE TABLET BY MOUTH ONCE DAILY 10/05/16   Kerri Perches, MD  predniSONE (STERAPRED UNI-PAK 21 TAB) 5 MG (21) TBPK tablet Take 1 tablet (5 mg total) by mouth as directed. Use as directed 01/07/17   Kerri Perches, MD  promethazine-phenylephrine (PROMETHAZINE VC PLAIN) 6.25-5 MG/5ML SYRP Take 10 mLs by mouth every 4 (four) hours as needed for congestion. 01/31/16   Eustace Moore, MD    Family History Family History  Problem Relation Age of Onset  . Hypertension Mother   . Obesity Mother     Social History Social History   Tobacco Use  . Smoking status: Never Smoker  . Smokeless tobacco: Never Used  Substance Use Topics  . Alcohol use: No  . Drug use: No     Allergies   Patient has no known allergies.   Review of Systems Review of Systems  Constitutional: Negative for chills and fever.  Eyes: Negative for visual disturbance.  Respiratory: Negative for chest tightness and shortness of breath.   Cardiovascular: Negative for chest pain.  Genitourinary: Negative for dysuria and hematuria.  Musculoskeletal: Positive for back pain (R lower back down RLE).  Neurological: Negative for dizziness, weakness, light-headedness, numbness and headaches.       Negative for incontinence     Physical Exam Updated Vital Signs BP [name] Kitchen)  180/84 (BP Location: Right Arm)   Pulse 77   Temp 98.2 F (36.8 C) (Oral)   Resp 17   Ht 5\' 11"  (1.803 m)   Wt (!) 156.9 kg (346 lb)   SpO2 98%   BMI 48.26 kg/m   Physical Exam  Constitutional: He appears well-developed and well-nourished.  Non-toxic appearance. No distress.  HENT:  Head: Normocephalic and atraumatic.  Eyes: Conjunctivae are normal. Right eye exhibits no discharge. Left eye exhibits no discharge.  Neck: No spinous process tenderness present.  Cardiovascular:    Pulses:      Radial pulses are 2+ on the right side, and 2+ on the left side.       Dorsalis pedis pulses are 2+ on the right side, and 2+ on the left side.  Abdominal: Soft. He exhibits no distension. There is no tenderness. There is no CVA tenderness.  Musculoskeletal:  No obvious deformity, appreciable swelling, overlying erythema, or warmth.  Back: No midline tenderness. Patient with tenderness to palpation in right buttocks area.  Lower extremities: patient has full ROM to bilateral hips, knees, and ankles- pain with R hip active flexion with active straight leg raise. No bony tenderness.   Neurological:  Alert. Clear speech.  Bilateral upper and lower extremities' sensation intact to sharp and dull touch. 5/5 grip strength bilaterally. 5/5 plantar and dorsi flexion bilaterally. Patellar DTRs are 2+ and symmetric. Pain reproduced with RLE straight leg raise. Gait is intact.   Psychiatric: He has a normal mood and affect. His behavior is normal. Thought content normal.  Nursing note and vitals reviewed.  ED Treatments / Results  Labs (all labs ordered are listed, but only abnormal results are displayed) Labs Reviewed - No data to display  EKG  EKG Interpretation None      Radiology No results found.  Procedures Procedures (including critical care time)  Medications Ordered in ED Medications  lisinopril (PRINIVIL,ZESTRIL) tablet 20 mg (20 mg Oral Given 01/20/17 2257)  hydrochlorothiazide (HYDRODIURIL) tablet 25 mg (25 mg Oral Given 01/20/17 2258)  predniSONE (DELTASONE) tablet 60 mg (60 mg Oral Given 01/20/17 2258)  oxyCODONE-acetaminophen (PERCOCET/ROXICET) 5-325 MG per tablet 1 tablet (1 tablet Oral Given 01/20/17 2257)    Initial Impression / Assessment and Plan / ED Course  I have reviewed the triage vital signs and the nursing notes.  Pertinent labs & imaging results that were available during my care of the patient were reviewed by me and considered in my medical decision  making (see chart for details).  Patient presents with right lower back pain that radiates down RLE. Patient is nontoxic appearing, vital signs WNL with exception of blood pressure being elevated in the setting of not having anti-hypertensive today. Pain is reproduced with RLE straight leg raise. Patient without midline tenderness.  No neurological deficits and normal neuro exam.  Patient can walk but states is painful.  No loss of bowel or bladder control.  No concern for cauda equina.  No fever, night sweats, weight loss, h/o cancer, IVDU.  Will treat with Prednisone, Naproxen, Robaxin, and Lidoderm patches. Patient given his BP medication in the emergency department, BP remained elevated at time of discharge, but improved. Patient denies headache, change in vision, numbness, weakness, chest pain, dyspnea, dizziness, or lightheadedness therefore doubt hypertensive emergency. Discussed elevated blood pressure with the patient and the need for med compliance and PCP follow up. Patient with scheduled PCP appointment for tomorrow already. I discussed treatment plan, need for PCP follow-up as scheduled,  and return precautions with the patient. Provided opportunity for questions, patient confirmed understanding and is in agreement with plan.   Vitals:   01/20/17 1837 01/20/17 2248  BP: (!) 180/84 (!) 144/77  Pulse: 77 72  Resp: 17 18  Temp: 98.2 F (36.8 C)   SpO2: 98% 99%    Final Clinical Impressions(s) / ED Diagnoses   Final diagnoses:  Acute right-sided low back pain with right-sided sciatica    ED Discharge Orders        Ordered    predniSONE (DELTASONE) 10 MG tablet  Daily with breakfast     01/20/17 2250    naproxen (NAPROSYN) 250 MG tablet  2 times daily with meals     01/20/17 2250    methocarbamol (ROBAXIN) 500 MG tablet  Every 8 hours PRN     01/20/17 2250    lidocaine (LIDODERM) 5 %  Every 24 hours     01/20/17 2250       Mohamadou Maciver, NobleSamantha R, PA-C 01/21/17 0214    Charlynne PanderYao,  David Hsienta, MD 01/23/17 [phone]

## 2017-01-20 NOTE — Discharge Instructions (Signed)
Back Pain:  I have prescribed you a steroids, a nonsteroidal anti-inflammatory medication , topical patches, and a muscle relaxer.   Prednisone is a steroids- take 5 tablets of this daily for the next 5 days.   Naproxen is a nonsteroidal anti-inflammatory medication that will help with pain and swelling. Be sure to take this medication as prescribed with food, 1 pill every 12 hours,  It should be taken with food, as it can cause stomach upset, and more seriously, stomach bleeding. Do not take other nonsteroidal anti-inflammatory medications with this such as Advil, Motrin, or Aleve.   Robaxin is the muscle relaxer I have prescribed, this is meant to help with muscle tightness. Be aware that this medication may make you drowsy therefore the first time you take this it should be at a time you are in an environment where you can rest. Do not drive or operate heavy machinery when taking this medication.   Lidoderm patches are the topical patches that should be applied to your R buttocks area. You may as the pharmacist for a over the counter version alternative.   In addition you may also take Tylenol. Tylenol is generally safe, though you should not take more than 8 of the extra strength (500mg ) pills a day.  The application of heat can help soothe the pain.  Maintaining your daily activities, including walking, is encourged, as it will help you get better faster than just staying in bed. The pain may last several days or a few weeks.   Follow up with your primary care provider as scheduled tomorrow.   However if you develop severe or worsening pain, low back pain with fever, numbness, weakness, loss of bowel or bladder control, or inability to walk or urinate, you should return to the ER immediately.  Please follow up with your doctor this week for a recheck if still having symptoms.  Additionally your blood pressure was elevated in the emergency department today, this is likely because you did not  take your blood pressure medication.  You were given a dose of your medication in the emergency department.  Be sure to have this rechecked tomorrow at your primary care appointment.

## 2017-01-20 NOTE — Telephone Encounter (Signed)
Patient's mother called in 01/20/17 at 11:00 because patient is having pain radiating from his lower back down his leg, this has been going on for 2 weeks she says he spoke with brandi 01/15/17 at that time brandi offered patient an appt for 01/20/17 but patient declined appt because he was not available. Patient is in pain today so he called and left a message to request appt, I called him back at 9:48 to let him know he was already scheduled for the next availability 01/25/17. He requested to speak to brandi so he could come in sooner. Then his mother called at 1911 because patient  Had not received a call back from our office. ( it has only been 1 hour and 12 min since the conversation with the patient)  Basically patient wants a sooner appt.

## 2017-01-20 NOTE — ED Notes (Signed)
Pt departed in NAD, refused use of wheelchair.  

## 2017-01-20 NOTE — Telephone Encounter (Signed)
Can you get the information from him and I will address it. Thanks

## 2017-01-21 ENCOUNTER — Ambulatory Visit (INDEPENDENT_AMBULATORY_CARE_PROVIDER_SITE_OTHER): Payer: Self-pay | Admitting: Family Medicine

## 2017-01-21 ENCOUNTER — Encounter: Payer: Self-pay | Admitting: Family Medicine

## 2017-01-21 VITALS — BP 160/88 | HR 94 | Resp 16 | Ht 71.0 in | Wt 361.0 lb

## 2017-01-21 DIAGNOSIS — I10 Essential (primary) hypertension: Secondary | ICD-10-CM

## 2017-01-21 DIAGNOSIS — Z23 Encounter for immunization: Secondary | ICD-10-CM

## 2017-01-21 DIAGNOSIS — M5441 Lumbago with sciatica, right side: Secondary | ICD-10-CM

## 2017-01-21 DIAGNOSIS — M5431 Sciatica, right side: Secondary | ICD-10-CM

## 2017-01-21 MED ORDER — METHYLPREDNISOLONE ACETATE 80 MG/ML IJ SUSP
120.0000 mg | Freq: Once | INTRAMUSCULAR | Status: AC
  Administered 2017-01-21: 120 mg via INTRAMUSCULAR

## 2017-01-21 MED ORDER — AMLODIPINE BESYLATE 5 MG PO TABS: 5.0000 mg | ORAL_TABLET | Freq: Every day | ORAL | 5 refills | Status: DC

## 2017-01-21 MED ORDER — HYDROCODONE-ACETAMINOPHEN 5-325 MG PO TABS: ORAL_TABLET | ORAL | 0 refills | Status: DC

## 2017-01-21 MED ORDER — KETOROLAC TROMETHAMINE 60 MG/2ML IM SOLN
60.0000 mg | Freq: Once | INTRAMUSCULAR | Status: AC
  Administered 2017-01-21: 60 mg via INTRAMUSCULAR

## 2017-01-21 NOTE — Patient Instructions (Signed)
F/u in 5.5 months, call  u  If you need me before  Flu vaccine today  Toradol 6-00 mg  IM and depomedrol 120 mg IM today for back pain, and fill prescriptions sent on already, you will get limited amount of hydrocodone also  New additional medication for blood pressure is amlodipine one daily  Make changes in food choice for health  Thank you  for choosing [facility]. We consider it a privelige to serve you.  Delivering excellent health care in a caring and  compassionate way is our goal.  Partnering with you,  so that together we can achieve this goal is our strategy.

## 2017-01-22 ENCOUNTER — Ambulatory Visit (HOSPITAL_COMMUNITY)
Admission: RE | Admit: 2017-01-22 | Discharge: 2017-01-22 | Disposition: A | Discharge status: Home / Self Care | Payer: BLUE CROSS/BLUE SHIELD | Source: Ambulatory Visit | Attending: Family Medicine | Admitting: Family Medicine

## 2017-01-22 DIAGNOSIS — M47816 Spondylosis without myelopathy or radiculopathy, lumbar region: Secondary | ICD-10-CM | POA: Insufficient documentation

## 2017-01-22 DIAGNOSIS — M5441 Lumbago with sciatica, right side: Secondary | ICD-10-CM | POA: Diagnosis present

## 2017-01-23 ENCOUNTER — Encounter: Payer: Self-pay | Admitting: Family Medicine

## 2017-01-24 ENCOUNTER — Encounter: Payer: Self-pay | Admitting: Family Medicine

## 2017-01-24 DIAGNOSIS — M5431 Sciatica, right side: Secondary | ICD-10-CM | POA: Insufficient documentation

## 2017-01-24 NOTE — Assessment & Plan Note (Signed)
Deteriorated. Patient re-educated about  the importance of commitment to a  minimum of 150 minutes of exercise per week.  The importance of healthy food choices with portion control discussed. Encouraged to start a food diary, count calories and to consider  joining a support group. Sample diet sheets offered. Goals set by the patient for the next several months.   Weight /BMI 01/21/2017 01/20/2017 01/31/2016  WEIGHT 361 lb 346 lb 346 lb 0.6 oz  HEIGHT 5\' 11"  5\' 11"  5\' 11"   BMI 50.35 kg/m2 48.26 kg/m2 48.26 kg/m2

## 2017-01-24 NOTE — Progress Notes (Signed)
Danny Gomez     MRN: [id number]      DOB: 02/11/1985   HPI Mr. Danny Gomez is here for follow up from ED visit earlier this morning because of back pain radiating to right foot which started abruptly approximately 10 days ago when he was bending to tie his shoe. He denies any weakness, numbness or incontinence. This is the first episode. 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 Denies sinus pressure, nasal congestion, ear pain or sore throat. Denies chest congestion, productive cough or wheezing. Denies chest pains, palpitations and leg swelling Denies abdominal pain, nausea, vomiting,diarrhea or constipation.   Denies dysuria, frequency, hesitancy or incontinence. Denies depression,  Denies skin break down or rash.   PE  BP (!) 160/88   Pulse 94   Resp 16   Ht 5\' 11"  (1.803 m)   Wt (!) 361 lb (163.7 kg)   SpO2 96%   BMI 50.35 kg/m   Patient alert and oriented and in no cardiopulmonary distress. Pt in PAIN  HEENT: No facial asymmetry, EOMI,   oropharynx pink and moist.  Neck supple no JVD, no mass.  Chest: Clear to auscultation bilaterally.  CVS: S1, S2 no murmurs, no S3.Regular rate.  ABD: Soft non tender.   Ext: No edema  MS: Decreased  ROM lumbar  Spine,adequate in  Shoulders,  Reduced in right  hips and knees.  Skin: Intact, no ulcerations or rash noted.  Psych: Good eye contact, normal affect. Memory intact not anxious or depressed appearing.  CNS: CN 2-12 intact, power,  normal throughout.no focal deficits noted.   Assessment & Plan  Back pain with right-sided sciatica Uncontrolled.Toradol 60 mg and  depo medrol 120 mg  administered IM in the office , to be followed by a short course of oral prednisone and NSAIDS.already prescribed in the ED earlier this morning Short course of hydrocodone prescribed for bedtime use only as needed due to severe pain   Morbid obesity Deteriorated. Patient re-educated about  the importance of commitment to a  minimum of 150 minutes of exercise per week.  The importance of healthy food choices with portion control discussed. Encouraged to start a food diary, count calories and to consider  joining a support group. Sample diet sheets offered. Goals set by the patient for the next several months.   Weight /BMI 01/21/2017 01/20/2017 01/31/2016  WEIGHT 361 lb 346 lb 346 lb 0.6 oz  HEIGHT 5\' 11"  5\' 11"  5\' 11"   BMI 50.35 kg/m2 48.26 kg/m2 48.26 kg/m2      Essential hypertension Uncontrolled , add amlodipine  Nurse BP check in 6 weeks MD follow up in 6 monhts DASH diet and commitment to daily physical activity for a minimum of 30 minutes discussed and encouraged, as a part of hypertension management. The importance of attaining a healthy weight is also discussed.  BP/Weight 01/21/2017 01/20/2017 01/31/2016 12/05/2015 08/21/2015 03/18/2015 02/04/2015  Systolic BP 160 144 148 158 130 132 142  Diastolic BP 88 77 84 84 78 82 80  Wt. (Lbs) 361 346 346.04 331.04 324 - 326  BMI 50.35 48.26 48.26 46.17 45.19 - 45.49

## 2017-01-24 NOTE — Assessment & Plan Note (Signed)
Uncontrolled , add amlodipine  Nurse BP check in 6 weeks MD follow up in 6 monhts DASH diet and commitment to daily physical activity for a minimum of 30 minutes discussed and encouraged, as a part of hypertension management. The importance of attaining a healthy weight is also discussed.  BP/Weight 01/21/2017 01/20/2017 01/31/2016 12/05/2015 08/21/2015 03/18/2015 02/04/2015  Systolic BP 160 144 148 158 130 132 142  Diastolic BP 88 77 84 84 78 82 80  Wt. (Lbs) 361 346 346.04 331.04 324 - 326  BMI 50.35 48.26 48.26 46.17 45.19 - 45.49

## 2017-01-24 NOTE — Assessment & Plan Note (Addendum)
Uncontrolled.Toradol 60 mg and depo medrol 120 mg  administered IM in the office , to be followed by a short course of oral prednisone and NSAIDS.already prescribed in the ED earlier this morning Short course of hydrocodone prescribed for bedtime use only as needed due to severe pain

## 2017-01-25 ENCOUNTER — Other Ambulatory Visit: Payer: Self-pay | Admitting: Family Medicine

## 2017-01-25 ENCOUNTER — Telehealth: Payer: Self-pay | Admitting: Family Medicine

## 2017-01-25 ENCOUNTER — Ambulatory Visit: Payer: BLUE CROSS/BLUE SHIELD | Admitting: Family Medicine

## 2017-01-25 ENCOUNTER — Other Ambulatory Visit: Payer: Self-pay

## 2017-01-25 DIAGNOSIS — M5410 Radiculopathy, site unspecified: Secondary | ICD-10-CM

## 2017-01-25 MED ORDER — GABAPENTIN 300 MG PO CAPS: 300.0000 mg | ORAL_CAPSULE | Freq: Three times a day (TID) | ORAL | 1 refills | Status: DC

## 2017-01-25 MED ORDER — DIAZEPAM 5 MG PO TABS: ORAL_TABLET | ORAL | 0 refills | Status: DC

## 2017-01-25 NOTE — Telephone Encounter (Signed)
Mother said pain is no better and its severely limiting him and he's staying in bed. She is wanting MRI ordered

## 2017-01-25 NOTE — Telephone Encounter (Signed)
Patient mother called in this morning stating that no medication is working for his back pain, she is requesting an mri because patient has been in bed since tue of last week.  Cb#: [phone]

## 2017-01-25 NOTE — Telephone Encounter (Signed)
I spoke dirrctly withpt, states he does have [employer], wants MRI, nothing allows hoim to stand without pain, hydrocodone affords short sleep. m[facility] because of weight. He is to call back with his ins info so this can be approved,, also to let us know who eh wants to be referred to regarding the back pain when he calls back  Please send in gbapentin and also valium 5 mg one tablet  Repeat one time if needed use only for mRI, [facility] [address] [facility] [place] , script is printed, pharmacy not on file

## 2017-01-26 ENCOUNTER — Encounter: Payer: Self-pay | Admitting: Family Medicine

## 2017-01-26 ENCOUNTER — Telehealth: Payer: Self-pay

## 2017-01-26 NOTE — Telephone Encounter (Signed)
Mother called this am and wants to know when MRI is scheduled

## 2017-01-26 NOTE — Telephone Encounter (Signed)
Mri scheduled.pt aware

## 2017-01-26 NOTE — Telephone Encounter (Signed)
This has to be precerted. I cannot call [employer] for this while seeing patients. I will do as soon as able

## 2017-01-27 ENCOUNTER — Other Ambulatory Visit: Payer: Self-pay | Admitting: Neurosurgery

## 2017-01-27 ENCOUNTER — Encounter: Payer: Self-pay | Admitting: Family Medicine

## 2017-01-27 ENCOUNTER — Telehealth: Payer: Self-pay

## 2017-01-27 ENCOUNTER — Ambulatory Visit (HOSPITAL_COMMUNITY)
Admission: RE | Admit: 2017-01-27 | Discharge: 2017-01-27 | Disposition: A | Discharge status: Home / Self Care | Payer: BLUE CROSS/BLUE SHIELD | Source: Ambulatory Visit | Attending: Family Medicine | Admitting: Family Medicine

## 2017-01-27 ENCOUNTER — Other Ambulatory Visit: Payer: Self-pay | Admitting: Family Medicine

## 2017-01-27 DIAGNOSIS — M5410 Radiculopathy, site unspecified: Secondary | ICD-10-CM | POA: Diagnosis present

## 2017-01-27 DIAGNOSIS — M5431 Sciatica, right side: Secondary | ICD-10-CM

## 2017-01-27 DIAGNOSIS — M5126 Other intervertebral disc displacement, lumbar region: Secondary | ICD-10-CM | POA: Insufficient documentation

## 2017-01-27 DIAGNOSIS — M5127 Other intervertebral disc displacement, lumbosacral region: Secondary | ICD-10-CM | POA: Insufficient documentation

## 2017-01-27 NOTE — Progress Notes (Signed)
amb neurosurg 

## 2017-01-27 NOTE — Telephone Encounter (Signed)
appt with neurosurgery today

## 2017-01-27 NOTE — Telephone Encounter (Signed)
[name] came by the office and said that Danny Gomez needs help and that he has to climb stairs at his apt and they are having a time with him and he cannot even walk. Wants something done now. He just finished with his MRI

## 2017-02-04 ENCOUNTER — Other Ambulatory Visit: Payer: Self-pay

## 2017-02-04 ENCOUNTER — Encounter (HOSPITAL_COMMUNITY)
Admission: RE | Admit: 2017-02-04 | Discharge: 2017-02-04 | Disposition: A | Discharge status: Home / Self Care | Payer: BLUE CROSS/BLUE SHIELD | Source: Ambulatory Visit | Attending: Neurosurgery | Admitting: Neurosurgery

## 2017-02-04 ENCOUNTER — Encounter: Payer: Self-pay | Admitting: Family Medicine

## 2017-02-04 ENCOUNTER — Other Ambulatory Visit (HOSPITAL_COMMUNITY): Payer: BLUE CROSS/BLUE SHIELD

## 2017-02-04 ENCOUNTER — Encounter (HOSPITAL_COMMUNITY): Payer: Self-pay

## 2017-02-04 DIAGNOSIS — Z79899 Other long term (current) drug therapy: Secondary | ICD-10-CM | POA: Insufficient documentation

## 2017-02-04 DIAGNOSIS — R51 Headache: Secondary | ICD-10-CM | POA: Insufficient documentation

## 2017-02-04 DIAGNOSIS — Z01818 Encounter for other preprocedural examination: Secondary | ICD-10-CM | POA: Diagnosis not present

## 2017-02-04 DIAGNOSIS — I10 Essential (primary) hypertension: Secondary | ICD-10-CM | POA: Diagnosis not present

## 2017-02-04 DIAGNOSIS — Z6841 Body Mass Index (BMI) 40.0 and over, adult: Secondary | ICD-10-CM | POA: Insufficient documentation

## 2017-02-04 DIAGNOSIS — Z9889 Other specified postprocedural states: Secondary | ICD-10-CM | POA: Insufficient documentation

## 2017-02-04 HISTORY — DX: Unspecified asthma, uncomplicated: J45.909

## 2017-02-04 HISTORY — DX: Myoneural disorder, unspecified: G70.9

## 2017-02-04 LAB — CBC
HCT: 48.3 % (ref 39.0–52.0)
Hemoglobin: 16.3 g/dL (ref 13.0–17.0)
MCH: 29.6 pg (ref 26.0–34.0)
MCHC: 33.7 g/dL (ref 30.0–36.0)
MCV: 87.7 fL (ref 78.0–100.0)
Platelets: 280 10*3/uL (ref 150–400)
RBC: 5.51 MIL/uL (ref 4.22–5.81)
RDW: 14.9 % (ref 11.5–15.5)
WBC: 16.3 10*3/uL — ABNORMAL HIGH (ref 4.0–10.5)

## 2017-02-04 LAB — COMPREHENSIVE METABOLIC PANEL
ALT: 26 U/L (ref 17–63)
AST: 21 U/L (ref 15–41)
Albumin: 3.7 g/dL (ref 3.5–5.0)
Alkaline Phosphatase: 60 U/L (ref 38–126)
Anion gap: 11 (ref 5–15)
BUN: 15 mg/dL (ref 6–20)
CO2: 23 mmol/L (ref 22–32)
Calcium: 9.7 mg/dL (ref 8.9–10.3)
Chloride: 104 mmol/L (ref 101–111)
Creatinine, Ser: 0.82 mg/dL (ref 0.61–1.24)
GFR calc Af Amer: >60 mL/min (ref >60)
GFR calc non Af Amer: >60 mL/min (ref >60)
Glucose, Bld: 98 mg/dL (ref 65–99)
Potassium: 4.6 mmol/L (ref 3.5–5.1)
Sodium: 138 mmol/L (ref 135–145)
Total Bilirubin: 0.4 mg/dL (ref 0.3–1.2)
Total Protein: 7.5 g/dL (ref 6.5–8.1)

## 2017-02-04 LAB — SURGICAL PCR SCREEN
MRSA, PCR: NEGATIVE
Staphylococcus aureus: NEGATIVE

## 2017-02-04 NOTE — Pre-Procedure Instructions (Signed)
Danny Gomez  02/04/2017      [facility] [place], [place] - [address] [place] [address] Phone: [phone] Fax: [phone]    Your procedure is scheduled on 02/10/2017  [facility] Admitting at 10:30 A.M.  Call this number if you have problems the morning of surgery:  [phone]   Remember:  Do not eat food or drink liquids after midnight.   Take these medicines the morning of surgery with A SIP OF WATER : Amlodipine, Gabapentin, Methocarbamol, Tylenol   CAll or msg. Dr. Lodema [facility]Simpson on [facility] Chart to get a clear understanding of thwe BP meds. & schedule she wants you to take   Do not wear jewelry   Do not wear lotions, powders, or perfumes, or deodorant.              Men may shave face and neck.   Do not bring valuables to the hospital.   [facility] is not responsible for any belongings or valuables.  Contacts, dentures or bridgework may not be worn into surgery.  Leave your suitcase in the car.  After surgery it may be brought to your room.  For patients admitted to the hospital, discharge time will be determined by your treatment team.  Patients discharged the day of surgery will not be allowed to drive home.   Name and phone number of your driver:  With family  Special instructions:  Special Instructions: [facility] - Preparing for Surgery  Before surgery, you can play an important role.  Because skin is not sterile, your skin needs to be as free of germs as possible.  You can reduce the number of germs on you skin by washing with CHG (chlorahexidine gluconate) soap before surgery.  CHG is an antiseptic cleaner which kills germs and bonds with the skin to continue killing germs even after washing.  Please DO NOT use if you have an allergy to CHG or antibacterial soaps.  If your skin becomes reddened/irritated stop using the CHG and inform your nurse when you arrive at Short Stay.  Do not  shave (including legs and underarms) for at least 48 hours prior to the first CHG shower.  You may shave your face.  Please follow these instructions carefully:   1.  Shower with CHG Soap the night before surgery and the  morning of Surgery.  2.  If you choose to wash your hair, wash your hair first as usual with your  normal shampoo.  3.  After you shampoo, rinse your hair and body thoroughly to remove the  Shampoo.  4.  Use CHG as you would any other liquid soap.  You can apply chg directly to the skin and wash gently with scrungie or a clean washcloth.  5.  Apply the CHG Soap to your body ONLY FROM THE NECK DOWN.    Do not use on open wounds or open sores.  Avoid contact with your eyes, ears, mouth and genitals (private parts).  Wash genitals (private parts)   with your normal soap.  6.  Wash thoroughly, paying special attention to the area where your surgery will be performed.  7.  Thoroughly rinse your body with warm water from the neck down.  8.  DO NOT shower/wash with your normal soap after using and rinsing off   the CHG Soap.  9.  Pat yourself dry with a clean towel.  10.  Wear clean pajamas.            11.  Place clean sheets on your bed the night of your first shower and do not sleep with pets.  Day of Surgery  Do not apply any lotions/deodorants the morning of surgery.  Please wear clean clothes to the hospital/surgery center.  Please read over the following fact sheets that you were given. Pain Booklet, Coughing and Deep Breathing, MRSA Information and Surgical Site Infection Prevention

## 2017-02-04 NOTE — Progress Notes (Signed)
Pt. BP elevated on initial entry to PAT.  When a different ( larger cuff ) is in place the diastolic improved.  Pt. Was on Lisinopril & later started amlodipine, ordered by Dr. Lodema HongSimpson- PCP.  Pt. Thought that he was suppose to go off lisinopril at that time. So he has not been taking the Lisinopril.  Pt. Instructed to msg Dr. Nelson ChimesSimpson someway today or tomorrow to clear up the plan for the  antihypertensives. Pt. Denies chest concerns.

## 2017-02-05 ENCOUNTER — Encounter (HOSPITAL_COMMUNITY): Payer: Self-pay | Admitting: Vascular Surgery

## 2017-02-05 NOTE — Progress Notes (Signed)
Anesthesia Chart Review: Patient is a 32 year old male scheduled for right L4-5, L5-S1 laminotomy and microdiscectomy on 02/10/17 by Dr. Shirlean Kellyobert Nudelman.  History includes never smoker, hypertension, morbid obesity, headaches, childhood asthma, tonsillectomy, right ankle surgery '02. BMI is consistent with super morbid obesity.  PCP [facility] Syl[facility]aret Simpson [facility]. Last visit 01/21/17 for f/u ED visit for back pain. Meds prescribed for back pain. Also amlodipine added for HTN, BP 160/88. (Patient reported that he thought amlodipine was in place of lisinopril-HCTZ, so has not been taking lisinopril-HCTZ. Advised to clarify with Dr. Lodema HongSimpson.)  Meds include amlodipine, gabapentin, Robaxin. Lisinopril-HCTZ 20-25 mg tablet is on his med list but was not taking as of 02/04/17 (see above explanation). Received Toradol and depo medrol injections 01/21/17 and prescribed prednisone 50 mg X 5 days.    BP (!) 150/93   Pulse (!) 103   Temp 36.7 C   Resp 18   Ht 5\' 11"  (1.803 m)   Wt (!) 363 lb 1.6 oz (164.7 kg)   SpO2 95%   BMI 50.64 kg/m  Arrived at PAT BP 150/108, recheck with larger cuff, 150/93.   EKG 02/04/17: NSR.  Preoperative labs noted. CMET WNL. WBC elevated at 16.3 (up from 7/7 on 01/31/16). H/H 16.3/48.3. PLT 280. Glucose 98.  PAT BP's and elevated WBC [facility] Earl GalaNudelman's office. BP at their office was 140/91. No sick symptoms documented from PAT visit. He was prescribed prednisone on 01/21/17, but if taken as prescribed should have been completed just over a week ago. [facility] She will follow-up with Dr. Newell CoralNudelman for any additional recommendations. From an anesthesia standpoint would anticipate that he could proceed as planned if BP on day of surgery is reasonable and without sick symptoms.  Velna Ochsllison Shiniqua Groseclose, PA-C [facility]Anesthesiology Phone [phone] 02/05/2017 10:56 AM

## 2017-02-08 ENCOUNTER — Ambulatory Visit: Payer: BLUE CROSS/BLUE SHIELD

## 2017-02-08 ENCOUNTER — Telehealth: Payer: Self-pay | Admitting: Family Medicine

## 2017-02-08 VITALS — BP 160/98

## 2017-02-08 DIAGNOSIS — I10 Essential (primary) hypertension: Secondary | ICD-10-CM

## 2017-02-08 NOTE — Telephone Encounter (Signed)
Pt left message that he had a question regarding his medication, would like a return [phone]

## 2017-02-08 NOTE — Progress Notes (Signed)
Advised to start taking both of his bP meds daily

## 2017-02-08 NOTE — Telephone Encounter (Signed)
Advised he is to be taking both bp meds and he is coming today for bp check

## 2017-02-09 ENCOUNTER — Ambulatory Visit: Payer: BLUE CROSS/BLUE SHIELD

## 2017-02-09 MED ORDER — DEXTROSE 5 % IV SOLN
3.0000 g | INTRAVENOUS | Status: DC
  Filled 2017-02-09: qty 3000

## 2017-02-10 ENCOUNTER — Encounter (HOSPITAL_COMMUNITY): Admission: RE | Payer: Self-pay | Source: Ambulatory Visit

## 2017-02-10 ENCOUNTER — Ambulatory Visit (HOSPITAL_COMMUNITY): Admission: RE | Admit: 2017-02-10 | Payer: BLUE CROSS/BLUE SHIELD | Source: Ambulatory Visit | Admitting: Neurosurgery

## 2017-02-10 SURGERY — LUMBAR LAMINECTOMY/DECOMPRESSION MICRODISCECTOMY 2 LEVELS
Anesthesia: General | Laterality: Right

## 2017-05-03 ENCOUNTER — Other Ambulatory Visit: Payer: Self-pay | Admitting: Family Medicine

## 2017-08-07 ENCOUNTER — Other Ambulatory Visit: Payer: Self-pay | Admitting: Family Medicine

## 2017-08-09 ENCOUNTER — Other Ambulatory Visit: Payer: Self-pay | Admitting: *Deleted

## 2017-08-09 ENCOUNTER — Telehealth: Payer: Self-pay | Admitting: Family Medicine

## 2017-08-09 MED ORDER — AMLODIPINE BESYLATE 5 MG PO TABS: 5.0000 mg | ORAL_TABLET | Freq: Every day | ORAL | 1 refills | Status: DC

## 2017-08-09 MED ORDER — LISINOPRIL-HYDROCHLOROTHIAZIDE 20-25 MG PO TABS: 1.0000 | ORAL_TABLET | Freq: Every day | ORAL | 1 refills | Status: DC

## 2017-08-09 NOTE — Telephone Encounter (Signed)
Refilled amlodipine and lisinopril---1 refill

## 2017-08-09 NOTE — Telephone Encounter (Signed)
Patient is requesting refills for lisinopril & amolodopin asap, he is leaving out of town on Wednesday. [facility] [address] Cb#: [phone]

## 2018-01-12 ENCOUNTER — Emergency Department (HOSPITAL_COMMUNITY)
Admission: EM | Admit: 2018-01-12 | Discharge: 2018-01-12 | Disposition: A | Discharge status: Home / Self Care | Payer: Self-pay | Attending: Emergency Medicine | Admitting: Emergency Medicine

## 2018-01-12 ENCOUNTER — Other Ambulatory Visit: Payer: Self-pay

## 2018-01-12 ENCOUNTER — Emergency Department (HOSPITAL_COMMUNITY): Payer: Self-pay

## 2018-01-12 ENCOUNTER — Encounter (HOSPITAL_COMMUNITY): Payer: Self-pay

## 2018-01-12 DIAGNOSIS — M79672 Pain in left foot: Secondary | ICD-10-CM | POA: Insufficient documentation

## 2018-01-12 MED ORDER — PREDNISONE 10 MG (21) PO TBPK: ORAL_TABLET | Freq: Every day | ORAL | 0 refills | Status: DC

## 2018-01-12 MED ORDER — HYDROCODONE-ACETAMINOPHEN 5-325 MG PO TABS
2.0000 | ORAL_TABLET | Freq: Once | ORAL | Status: AC
  Administered 2018-01-12: 2 via ORAL
  Filled 2018-01-12: qty 2

## 2018-01-12 MED ORDER — HYDROCODONE-ACETAMINOPHEN 5-325 MG PO TABS: 2.0000 | ORAL_TABLET | ORAL | 0 refills | Status: DC | PRN

## 2018-01-12 NOTE — ED Triage Notes (Signed)
Pt states left food is painful across top portion, denies injury, for approx 2 days. No hx of gout. 650 tylenol taken at 1630

## 2018-01-12 NOTE — ED Provider Notes (Signed)
[facility] EMERGENCY DEPARTMENT Provider Note   CSN: [id number] Arrival date & time: 01/12/18  2022   History   Chief Complaint Chief Complaint  Patient presents with  . Foot Pain    HPI Danny Gomez is a 32 y.o. male with past medical history significant for chronic headache, obesity, asthma who presents for evaluation of left foot pain.  Patient states he has pain to his left great toe.  Pain is located at the base of the left great toe.  Patient states pain has been present for approximately 2 days.  Pain is nonradiating.  Denies injury.  Rates his pain a 7/10.  Describes the pain as throbbing.  Pain has been constant in nature. Denies history of gout, or IV drug use.  Patient states he did take Tylenol for symptoms with mild relief.  Denies fever, chills, nausea, vomiting, decreased range of motion, numbness or tingling in his extremities, rashes or lesions, swelling, warmth to bilateral lower extremities, red streaking to lower extremity.  Pain is constant in nature.  No alleviating or aggravating factors.  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  Denies additional aggravating or alleviating factors.  History obtained from patient.  No interpreter was used.  HPI  Past Medical History:  Diagnosis Date  . Asthma    pt. reports as a child he used an inhaler , but asthma has resoled in the adult path  . Febrile illness   . Headache(784.0)   . Hypertension   . Neuromuscular disorder (HCC)    hnp- L4-5, L5-S1  . Obesity     Patient Active Problem List   Diagnosis Date Noted  . Back pain with right-sided sciatica 01/24/2017  . IGT (impaired glucose tolerance) 02/04/2015  . Vitamin D deficiency 02/06/2013  . Allergic rhinitis 10/29/2009  . Morbid obesity (HCC) 08/26/2007  . Essential hypertension 08/26/2007    Past Surgical History:  Procedure Laterality Date  . right ankle surgery  2002   bones had fused but it was corrected surgically  .  TONSILLECTOMY          Home Medications    Prior to Admission medications   Medication Sig Start Date End Date Taking? Authorizing Provider  acetaminophen (TYLENOL 8 HOUR ARTHRITIS PAIN) 650 MG CR tablet Take 1,300 mg by mouth every 8 (eight) hours as needed for pain.    [provider]  amLODipine (NORVASC) 5 MG tablet Take 1 tablet (5 mg total) by mouth daily. 08/09/17   Kerri Perches, MD  diazepam (VALIUM) 5 MG tablet One tablet 30 minutes before exam, may repeat after 15 minutes if needed Patient not taking: Reported on 02/01/2017 01/25/17   Kerri Perches, MD  gabapentin (NEURONTIN) 300 MG capsule Take 1 capsule (300 mg total) by mouth 3 (three) times daily. 01/25/17   Kerri Perches, MD  HYDROcodone-acetaminophen (NORCO/VICODIN) 5-325 MG tablet One tablet at bedtime , as needed, for uncontrolled back pain Patient not taking: Reported on 02/01/2017 01/21/17   Kerri Perches, MD  HYDROcodone-acetaminophen (NORCO/VICODIN) 5-325 MG tablet Take 2 tablets by mouth every 4 (four) hours as needed. 01/12/18   Heaven Wandell A, PA-C  hydrocortisone cream 1 % Apply 1 application topically 2 (two) times daily. Patient not taking: Reported on 02/01/2017 01/31/16   Eustace Moore, MD  lidocaine (LIDODERM) 5 % Place 1 patch onto the skin daily. Remove & Discard patch within 12 hours or as directed  by MD Patient not taking: Reported on 01/21/2017 01/20/17   Petrucelli, Pleas Koch, PA-C  lisinopril-hydrochlorothiazide (PRINZIDE,ZESTORETIC) 20-25 MG tablet Take 1 tablet by mouth daily. 08/09/17   Kerri Perches, MD  methocarbamol (ROBAXIN) 500 MG tablet Take 1 tablet (500 mg total) by mouth every 8 (eight) hours as needed for muscle spasms. 01/20/17   Petrucelli, Samantha R, PA-C  naproxen (NAPROSYN) 250 MG tablet Take 2 tablets (500 mg total) by mouth 2 (two) times daily with a meal. Patient taking differently: Take 250 mg by mouth 2 (two) times daily with a meal.  01/20/17    Petrucelli, Samantha R, PA-C  predniSONE (STERAPRED UNI-PAK 21 TAB) 10 MG (21) TBPK tablet Take by mouth daily. Take 6 tabs by mouth daily  for 2 days, then 5 tabs for 2 days, then 4 tabs for 2 days, then 3 tabs for 2 days, 2 tabs for 2 days, then 1 tab by mouth daily for 2 days 01/12/18   Marchella Hibbard A, PA-C    Family History Family History  Problem Relation Age of Onset  . Hypertension Mother   . Obesity Mother     Social History Social History   Tobacco Use  . Smoking status: Never Smoker  . Smokeless tobacco: Never Used  Substance Use Topics  . Alcohol use: No    Comment: socially- monthly   . Drug use: Yes    Types: Marijuana    Comment: once per day      Allergies   Patient has no known allergies.   Review of Systems Review of Systems  Constitutional: Negative.   HENT: Negative.   Respiratory: Negative.   Cardiovascular: Negative.   Gastrointestinal: Negative.   Genitourinary: Negative.   Musculoskeletal:       Left great toe pain.  Skin: Negative.   Neurological: Negative.   All other systems reviewed and are negative.    Physical Exam Updated Vital Signs BP (!) 150/79 (BP Location: Right Arm)   Pulse 86   Temp 97.8 F (36.6 C) (Oral)   Resp 18   Ht 5\' 11"  (1.803 m)   Wt (!) 154.2 kg   SpO2 97%   BMI 47.42 kg/m   Physical Exam Vitals signs and nursing note reviewed.  Constitutional:      General: He is not in acute distress.    Appearance: He is well-developed. He is not ill-appearing or diaphoretic.  HENT:     Head: Normocephalic and atraumatic.     Nose: Nose normal.     Mouth/Throat:     Mouth: Mucous membranes are moist.  Eyes:     Pupils: Pupils are equal, round, and reactive to light.  Neck:     Musculoskeletal: Normal range of motion and neck supple.  Cardiovascular:     Rate and Rhythm: Normal rate and regular rhythm.     Pulses: Normal pulses.     Heart sounds: Normal heart sounds. No murmur. No friction rub. No gallop.    Pulmonary:     Effort: Pulmonary effort is normal. No respiratory distress.     Breath sounds: Normal breath sounds. No stridor. No wheezing, rhonchi or rales.  Abdominal:     General: There is no distension.     Palpations: Abdomen is soft.     Tenderness: There is no abdominal tenderness.  Musculoskeletal: Normal range of motion.        General: Tenderness present. No swelling, deformity or signs of injury.  Comments: Full range of motion bilateral lower extremity.  5/5 strength bilateral lower extremity.  Tenderness palpation over base of left great toe.  No tenderness over dorsal or ventral portion of foot.  No tenderness over medial or lateral malleolus.  No gross deformity noted.  No swelling.  Skin:    General: Skin is warm and dry.     Findings: Erythema present. No lesion or rash.     Comments: Mild erythema with tenderness over the left base of great toe.  No rashes or lesions.  No abrasions.  No erythema extending beyond base of great toe.  No streaking.  No edema or warmth to bilateral lower extremities.  No induration or fluctuance, no evidence of abscess.  No evidence of cellulitis.  See picture attached.  Neurological:     Mental Status: He is alert.     Motor: No weakness.        ED Treatments / Results  Labs (all labs ordered are listed, but only abnormal results are displayed) Labs Reviewed - No data to display  EKG None  Radiology Dg Foot Complete Left  Result Date: 01/12/2018 CLINICAL DATA:  Foot pain for 2 days, no known injury, initial encounter EXAM: LEFT FOOT - COMPLETE 3+ VIEW COMPARISON:  None. FINDINGS: Tarsal degenerative changes are noted. No soft tissue swelling is seen. No acute fracture or dislocation is noted. IMPRESSION: No acute abnormality noted. Electronically Signed   By: Alcide CleverMark  Lukens M.D.   On: 01/12/2018 22:29    Procedures Procedures (including critical care time)  Medications Ordered in ED Medications  HYDROcodone-acetaminophen  (NORCO/VICODIN) 5-325 MG per tablet 2 tablet (2 tablets Oral Given 01/12/18 2321)     Initial Impression / Assessment and Plan / ED Course  I have reviewed the triage vital signs and the nursing notes.  Pertinent labs & imaging results that were available during my care of the patient were reviewed by me and considered in my medical decision making (see chart for details).  [age] year old male who appears otherwise well presents for evaluation of left foot pain.  Pain monoarticular with mild erythema.  Afebrile, nonseptic, non-ill-appearing.  Onset 2 days ago.  Tenderness and mild erythema to left great toe, predominantly at the base. No erythema or edema to ventral and dorsal portion of foot.  No streaking.  No warmth.  Normal musculoskeletal exam.  Neurovascularly intact.  Denies history of gout, IV drug use.  Low suspicion for septic joint at this time.  No evidence of cellulitis.  No calf tenderness, warmth, erythema or edema.  Denies history of trauma or injury.  Lower extremity compartments are soft.  Plain film without evidence of fracture, dislocation, soft tissue swelling, or effusion.  Exam consistent with gout of first metatarsal.  Will prescribe steroids as well as pain medication.  Low suspicion for emergent pathology causing patient's symptoms at this time.  Discussed return precautions with patient.  Discussed follow with PCP for reevaluation in 2 days.  Patient voiced understanding and is agreeable for follow-up.   Final Clinical Impressions(s) / ED Diagnoses   Final diagnoses:  Foot pain, left    ED Discharge Orders         Ordered    predniSONE (STERAPRED UNI-PAK 21 TAB) 10 MG (21) TBPK tablet  Daily     01/12/18 2247    HYDROcodone-acetaminophen (NORCO/VICODIN) 5-325 MG tablet  Every 4 hours PRN     01/12/18 2247  Barre Aydelott A, PA-C 01/12/18 2341    Terrilee FilesButler, Michael C, MD 01/13/18 1059

## 2018-01-12 NOTE — Discharge Instructions (Signed)
Evaluated today for left great toe pain.  Your x-ray was negative.  Do not have any evidence of infection.  I have prescribed you steroids as well as pain medication.  Please take as prescribed.  Please follow-up with your primary care doctor over the next 2 days for reevaluation.  Return to ED for any new worsening symptoms such as redness, swelling, red streaking going up your foot, new onset worsening pain.

## 2018-01-17 ENCOUNTER — Telehealth: Payer: Self-pay | Admitting: *Deleted

## 2018-01-17 NOTE — Telephone Encounter (Signed)
noted 

## 2018-01-17 NOTE — Telephone Encounter (Signed)
Patients mother called just wanted to let Dr. Lodema HongSimpson know that Aneta Minshillip was saw in the ER on 12/25 for gout.

## 2018-01-18 ENCOUNTER — Encounter: Payer: Self-pay | Admitting: Family Medicine

## 2018-01-18 ENCOUNTER — Telehealth: Payer: Self-pay | Admitting: *Deleted

## 2018-01-18 NOTE — Telephone Encounter (Signed)
Pts mother called. Wanting to know if something could be called in for gout in his foot. He was saw in ER 12/25 and they gave him prednisone. He is still taking it but he is not better.

## 2018-01-19 ENCOUNTER — Other Ambulatory Visit: Payer: Self-pay

## 2018-01-19 ENCOUNTER — Emergency Department (HOSPITAL_COMMUNITY)
Admission: EM | Admit: 2018-01-19 | Discharge: 2018-01-19 | Disposition: A | Discharge status: Home / Self Care | Payer: BLUE CROSS/BLUE SHIELD | Attending: Emergency Medicine | Admitting: Emergency Medicine

## 2018-01-19 ENCOUNTER — Emergency Department (HOSPITAL_BASED_OUTPATIENT_CLINIC_OR_DEPARTMENT_OTHER): Payer: BLUE CROSS/BLUE SHIELD

## 2018-01-19 ENCOUNTER — Encounter (HOSPITAL_COMMUNITY): Payer: Self-pay | Admitting: Emergency Medicine

## 2018-01-19 DIAGNOSIS — J45909 Unspecified asthma, uncomplicated: Secondary | ICD-10-CM | POA: Diagnosis not present

## 2018-01-19 DIAGNOSIS — M79672 Pain in left foot: Secondary | ICD-10-CM | POA: Diagnosis present

## 2018-01-19 DIAGNOSIS — Z79899 Other long term (current) drug therapy: Secondary | ICD-10-CM | POA: Diagnosis not present

## 2018-01-19 DIAGNOSIS — I10 Essential (primary) hypertension: Secondary | ICD-10-CM | POA: Diagnosis not present

## 2018-01-19 DIAGNOSIS — R52 Pain, unspecified: Secondary | ICD-10-CM

## 2018-01-19 DIAGNOSIS — I824Y2 Acute embolism and thrombosis of unspecified deep veins of left proximal lower extremity: Secondary | ICD-10-CM | POA: Diagnosis not present

## 2018-01-19 DIAGNOSIS — M7989 Other specified soft tissue disorders: Secondary | ICD-10-CM

## 2018-01-19 LAB — I-STAT CHEM 8, ED
BUN: 15 mg/dL (ref 6–20)
CALCIUM ION: 1.09 mmol/L — AB (ref 1.15–1.40)
CHLORIDE: 98 mmol/L (ref 98–111)
Creatinine, Ser: 0.8 mg/dL (ref 0.61–1.24)
GLUCOSE: 93 mg/dL (ref 70–99)
HCT: 46 % (ref 39.0–52.0)
Hemoglobin: 15.6 g/dL (ref 13.0–17.0)
Potassium: 4 mmol/L (ref 3.5–5.1)
SODIUM: 137 mmol/L (ref 135–145)
TCO2: 29 mmol/L (ref 22–32)

## 2018-01-19 MED ORDER — RIVAROXABAN (XARELTO) EDUCATION KIT FOR DVT/PE PATIENTS
PACK | Freq: Once | Status: AC
  Administered 2018-01-19: 14:00:00
  Filled 2018-01-19: qty 1

## 2018-01-19 MED ORDER — TRAMADOL HCL 50 MG PO TABS: 50.0000 mg | ORAL_TABLET | Freq: Four times a day (QID) | ORAL | 0 refills | Status: DC | PRN

## 2018-01-19 MED ORDER — INDOMETHACIN 25 MG PO CAPS
50.0000 mg | ORAL_CAPSULE | Freq: Once | ORAL | Status: AC
  Administered 2018-01-19: 50 mg via ORAL
  Filled 2018-01-19: qty 2

## 2018-01-19 MED ORDER — RIVAROXABAN 15 MG PO TABS
15.0000 mg | ORAL_TABLET | Freq: Two times a day (BID) | ORAL | Status: DC
  Filled 2018-01-19: qty 1

## 2018-01-19 MED ORDER — RIVAROXABAN (XARELTO) VTE STARTER PACK (15 & 20 MG): ORAL_TABLET | ORAL | 0 refills | Status: DC

## 2018-01-19 MED ORDER — RIVAROXABAN 15 MG PO TABS
15.0000 mg | ORAL_TABLET | ORAL | Status: AC
  Administered 2018-01-19: 15 mg via ORAL
  Filled 2018-01-19: qty 1

## 2018-01-19 NOTE — Progress Notes (Signed)
Left lower extremity venous duplex exam completed. Positive for Acute Deep veins thrombosis involving mid to distal posterior tibial and peroneal veins.  More details please see preliminary notes on CV PROC under chart review. Faith Patricelli H Marissa Weaver(RDMS RVT) 01/19/18 11:56 AM

## 2018-01-19 NOTE — Discharge Instructions (Signed)
Information on my medicine - XARELTO (rivaroxaban)  This medication education was reviewed with me or my healthcare representative as part of my discharge preparation.  The pharmacist that spoke with me during my hospital stay was: [facility] WAS Carlena Hurl PRESCRIBED FOR YOU? Xarelto was prescribed to treat blood clots that may have been found in the veins of your legs (deep vein thrombosis) or in your lungs (pulmonary embolism) and to reduce the risk of them occurring again.  What do you need to know about Xarelto? The starting dose is one 15 mg tablet taken TWICE daily with food for the FIRST 21 DAYS then on 02/09/18  the dose is changed to one 20 mg tablet taken ONCE A DAY with your evening meal.  DO NOT stop taking Xarelto without talking to the health care provider who prescribed the medication.  Refill your prescription for 20 mg tablets before you run out.  After discharge, you should have regular check-up appointments with your healthcare provider that is prescribing your Xarelto.  In the future your dose may need to be changed if your kidney function changes by a significant amount.  What do you do if you miss a dose? If you are taking Xarelto TWICE DAILY and you miss a dose, take it as soon as you remember. You may take two 15 mg tablets (total 30 mg) at the same time then resume your regularly scheduled 15 mg twice daily the next day.  If you are taking Xarelto ONCE DAILY and you miss a dose, take it as soon as you remember on the same day then continue your regularly scheduled once daily regimen the next day. Do not take two doses of Xarelto at the same time.   Important Safety Information Xarelto is a blood thinner medicine that can cause bleeding. You should call your healthcare provider right away if you experience any of the following: ? Bleeding from an injury or your nose that does not stop. ? Unusual colored urine (red or dark brown) or unusual colored stools (red  or black). ? Unusual bruising for unknown reasons. ? A serious fall or if you hit your head (even if there is no bleeding).  Some medicines may interact with Xarelto and might increase your risk of bleeding while on Xarelto. To help avoid this, consult your healthcare provider or pharmacist prior to using any new prescription or non-prescription medications, including herbals, vitamins, non-steroidal anti-inflammatory drugs (NSAIDs) and supplements.  This website has more information on Xarelto: VisitDestination.com.br.

## 2018-01-19 NOTE — ED Notes (Signed)
PT DISCHARGED. INSTRUCTIONS AND PRESCRIPTIONS GIVEN. AAOX4. PT IN NO APPARENT DISTRESS OR PAIN. THE OPPORTUNITY TO ASK QUESTIONS WAS PROVIDED. 

## 2018-01-19 NOTE — ED Triage Notes (Signed)
Pt reports having left foot and ankle pain with redness and swelling. Pt previously dx with gout on 01/12/18. Pt reports taking prednisone at this time.

## 2018-01-19 NOTE — ED Provider Notes (Signed)
[facility] DEPT Provider Note   CSN: [id number] Arrival date & time: 01/19/18  9476     History   Chief Complaint Chief Complaint  Patient presents with  . Gout    HPI Danny Gomez is a 33 y.o. male.  HPI Pt presents to the ED for evaluation of foot pain.  Sx started last Tuesday.  Pt is having pain and swelling in the left foot.  No fevers.  No CP or SOB.  Pt was evaluated in the ED and he has been taking prednisone but it has not helped.  Pt wanted to try a different medication.  He read about indocin.  He tried to contact his doctor but did not hear back.  Pt has had some mild calf pain as well Past Medical History:  Diagnosis Date  . Asthma    pt. reports as a child he used an inhaler , but asthma has resoled in the adult path  . Febrile illness   . Headache(784.0)   . Hypertension   . Neuromuscular disorder ([place])    hnp- L4-5, L5-S1  . Obesity     Patient Active Problem List   Diagnosis Date Noted  . Back pain with right-sided sciatica 01/24/2017  . IGT (impaired glucose tolerance) 02/04/2015  . Vitamin D deficiency 02/06/2013  . Allergic rhinitis 10/29/2009  . Morbid obesity ([place]) 08/26/2007  . Essential hypertension 08/26/2007    Past Surgical History:  Procedure Laterality Date  . right ankle surgery  2002   bones had fused but it was corrected surgically  . TONSILLECTOMY          Home Medications    Prior to Admission medications   Medication Sig Start Date End Date Taking? Authorizing Provider  acetaminophen (TYLENOL 8 HOUR ARTHRITIS PAIN) 650 MG CR tablet Take 1,300 mg by mouth every 8 (eight) hours as needed for pain.    [provider]  amLODipine (NORVASC) 5 MG tablet Take 1 tablet (5 mg total) by mouth daily. 08/09/17   Fayrene Helper, MD  diazepam (VALIUM) 5 MG tablet One tablet 30 minutes before exam, may repeat after 15 minutes if needed Patient not taking: Reported on 02/01/2017 01/25/17    Fayrene Helper, MD  gabapentin (NEURONTIN) 300 MG capsule Take 1 capsule (300 mg total) by mouth 3 (three) times daily. 01/25/17   Fayrene Helper, MD  HYDROcodone-acetaminophen (NORCO/VICODIN) 5-325 MG tablet One tablet at bedtime , as needed, for uncontrolled back pain Patient not taking: Reported on 02/01/2017 01/21/17   Fayrene Helper, MD  HYDROcodone-acetaminophen (NORCO/VICODIN) 5-325 MG tablet Take 2 tablets by mouth every 4 (four) hours as needed. 01/12/18   Henderly, Britni A, PA-C  hydrocortisone cream 1 % Apply 1 application topically 2 (two) times daily. Patient not taking: Reported on 02/01/2017 01/31/16   Raylene Everts, MD  lidocaine (LIDODERM) 5 % Place 1 patch onto the skin daily. Remove & Discard patch within 12 hours or as directed by MD Patient not taking: Reported on 01/21/2017 01/20/17   Petrucelli, Glynda Jaeger, PA-C  lisinopril-hydrochlorothiazide (PRINZIDE,ZESTORETIC) 20-25 MG tablet Take 1 tablet by mouth daily. 08/09/17   Fayrene Helper, MD  methocarbamol (ROBAXIN) 500 MG tablet Take 1 tablet (500 mg total) by mouth every 8 (eight) hours as needed for muscle spasms. 01/20/17   Petrucelli, Samantha R, PA-C  naproxen (NAPROSYN) 250 MG tablet Take 2 tablets (500 mg total) by mouth 2 (two) times daily with a meal.  Patient taking differently: Take 250 mg by mouth 2 (two) times daily with a meal.  01/20/17   Petrucelli, Samantha R, PA-C  predniSONE (STERAPRED UNI-PAK 21 TAB) 10 MG (21) TBPK tablet Take by mouth daily. Take 6 tabs by mouth daily  for 2 days, then 5 tabs for 2 days, then 4 tabs for 2 days, then 3 tabs for 2 days, 2 tabs for 2 days, then 1 tab by mouth daily for 2 days 01/12/18   Henderly, Britni A, PA-C  Rivaroxaban 15 & 20 MG TBPK Take as directed on package: Start with one '15mg'$  tablet by mouth twice a day with food. On Day 22, switch to one '20mg'$  tablet once a day with food. 01/19/18   Dorie Rank, MD  traMADol (ULTRAM) 50 MG tablet Take 1 tablet (50 mg total) by  mouth every 6 (six) hours as needed. 01/19/18   Dorie Rank, MD    Family History Family History  Problem Relation Age of Onset  . Hypertension Mother   . Obesity Mother     Social History Social History   Tobacco Use  . Smoking status: Never Smoker  . Smokeless tobacco: Never Used  Substance Use Topics  . Alcohol use: No    Comment: socially- monthly   . Drug use: Yes    Types: Marijuana    Comment: once per day      Allergies   Patient has no known allergies.   Review of Systems Review of Systems  All other systems reviewed and are negative.    Physical Exam Updated Vital Signs BP (!) 134/92 (BP Location: Right Arm)   Pulse 75   Temp 97.6 F (36.4 C) (Oral)   Resp 16   Ht 1.803 m ('5\' 11"'$ )   Wt (!) 154.2 kg   SpO2 99%   BMI 47.42 kg/m   Physical Exam Vitals signs and nursing note reviewed.  Constitutional:      General: He is not in acute distress.    Appearance: He is well-developed.  HENT:     Head: Normocephalic and atraumatic.     Right Ear: External ear normal.     Left Ear: External ear normal.  Eyes:     General: No scleral icterus.       Right eye: No discharge.        Left eye: No discharge.     Conjunctiva/sclera: Conjunctivae normal.  Neck:     Musculoskeletal: Neck supple.     Trachea: No tracheal deviation.  Cardiovascular:     Rate and Rhythm: Normal rate and regular rhythm.  Pulmonary:     Effort: Pulmonary effort is normal. No respiratory distress.     Breath sounds: Normal breath sounds. No stridor.  Abdominal:     General: There is no distension.  Musculoskeletal:        General: Swelling present. No deformity.     Left lower leg: He exhibits tenderness. No edema.     Left foot: Tenderness, bony tenderness and swelling present.  Skin:    General: Skin is warm and dry.     Findings: No rash.  Neurological:     Mental Status: He is alert.     Cranial Nerves: Cranial nerve deficit: no gross deficits.      ED Treatments  / Results  Labs (all labs ordered are listed, but only abnormal results are displayed) Labs Reviewed  I-STAT CHEM 8, ED    EKG None  Radiology Vas [place]  Lower Extremity Venous (dvt) (only Mc & Wl 7a-7p)  Result Date: 01/19/2018  Lower Venous Study Indications: Pain, and Swelling.  Limitations: Body habitus and musculoskeletal features. Performing Technologist: Rudell Cobb, H  Examination Guidelines: A complete evaluation includes B-mode imaging, spectral Doppler, color Doppler, and power Doppler as needed of all accessible portions of each vessel. Bilateral testing is considered an integral part of a complete examination. Limited examinations for reoccurring indications may be performed as noted.  Right Venous Findings: +---+---------------+---------+-----------+----------+-------+    CompressibilityPhasicitySpontaneityPropertiesSummary +---+---------------+---------+-----------+----------+-------+ CFVFull           Yes      Yes                          +---+---------------+---------+-----------+----------+-------+  Left Venous Findings: +---------+---------------+---------+-----------+----------+-------+          CompressibilityPhasicitySpontaneityPropertiesSummary +---------+---------------+---------+-----------+----------+-------+ CFV      Full           Yes      Yes                          +---------+---------------+---------+-----------+----------+-------+ SFJ      Full                                                 +---------+---------------+---------+-----------+----------+-------+ FV Prox  Full                                                 +---------+---------------+---------+-----------+----------+-------+ FV Mid   Full                                                 +---------+---------------+---------+-----------+----------+-------+ FV DistalFull                                                  +---------+---------------+---------+-----------+----------+-------+ PFV      Full                                                 +---------+---------------+---------+-----------+----------+-------+ POP      Full           Yes      Yes                          +---------+---------------+---------+-----------+----------+-------+ PTV      Partial                                      Acute   +---------+---------------+---------+-----------+----------+-------+ PERO     Partial                 No  Acute   +---------+---------------+---------+-----------+----------+-------+ Gastroc  Full                                                 +---------+---------------+---------+-----------+----------+-------+    Summary: Right: No evidence of common femoral vein obstruction. Left: Findings consistent with acute deep vein thrombosis involving the left posterior tibial vein, and left peroneal vein. No cystic structure found in the popliteal fossa.  *See table(s) above for measurements and observations.    Preliminary     Procedures Procedures (including critical care time)  Medications Ordered in ED Medications  rivaroxaban (XARELTO) Education Kit for DVT/PE patients (has no administration in time range)  Rivaroxaban (XARELTO) tablet 15 mg (has no administration in time range)  Rivaroxaban (XARELTO) tablet 15 mg (has no administration in time range)  indomethacin (INDOCIN) capsule 50 mg (50 mg Oral Given 01/19/18 0853)     Initial Impression / Assessment and Plan / ED Course  I have reviewed the triage vital signs and the nursing notes.  Pertinent labs & imaging results that were available during my care of the patient were reviewed by me and considered in my medical decision making (see chart for details).  Clinical Course as of Jan 19 1333  Wed Jan 19, 2018  0823 Most likely gout but with the calf pain and this being his first diagnosis will doppler.    [JK]  1320 Istat chem 8  hgb 15.6, creat 0.8   [JK]    Clinical Course User Index [JK] Dorie Rank, MD    Patient presented with persistent lower extremity pain and swelling.  Initially was diagnosed with gout.  Patient started to have some discomfort in his calf so I did get a Doppler study today.  It was positive for an acute DVT.  Patient is otherwise stable.  He is not having any symptoms to suggest PE.  Plan on discharge home with prescription for Ultram and start him on Xarelto.  Final Clinical Impressions(s) / ED Diagnoses   Final diagnoses:  Acute deep vein thrombosis (DVT) of proximal vein of left lower extremity [facility])    ED Discharge Orders         Ordered    traMADol (ULTRAM) 50 MG tablet  Every 6 hours PRN     01/19/18 1335    Rivaroxaban 15 & 20 MG TBPK     01/19/18 1335           Dorie Rank, MD 01/19/18 1336

## 2018-01-20 ENCOUNTER — Other Ambulatory Visit: Payer: Self-pay | Admitting: Family Medicine

## 2018-01-20 ENCOUNTER — Telehealth: Payer: Self-pay

## 2018-01-20 DIAGNOSIS — E790 Hyperuricemia without signs of inflammatory arthritis and tophaceous disease: Secondary | ICD-10-CM

## 2018-01-20 MED ORDER — HYDROCODONE-ACETAMINOPHEN 7.5-325 MG PO TABS: ORAL_TABLET | ORAL | 0 refills | Status: DC

## 2018-01-20 NOTE — Telephone Encounter (Signed)
I have sent  a message

## 2018-01-20 NOTE — Telephone Encounter (Signed)
Lab ordered and pt aware and scheduled for follow up

## 2018-01-21 ENCOUNTER — Encounter: Payer: Self-pay | Admitting: Family Medicine

## 2018-01-22 ENCOUNTER — Encounter: Payer: Self-pay | Admitting: Family Medicine

## 2018-01-22 ENCOUNTER — Other Ambulatory Visit: Payer: Self-pay | Admitting: Family Medicine

## 2018-01-22 LAB — URIC ACID: URIC ACID, SERUM: 7.8 mg/dL (ref 4.0–8.0)

## 2018-01-22 MED ORDER — ALLOPURINOL 100 MG PO TABS: 100.0000 mg | ORAL_TABLET | Freq: Every day | ORAL | 6 refills | Status: DC

## 2018-01-23 ENCOUNTER — Encounter: Payer: Self-pay | Admitting: Family Medicine

## 2018-01-25 ENCOUNTER — Encounter: Payer: Self-pay | Admitting: Family Medicine

## 2018-01-25 ENCOUNTER — Ambulatory Visit (INDEPENDENT_AMBULATORY_CARE_PROVIDER_SITE_OTHER): Payer: BLUE CROSS/BLUE SHIELD | Admitting: Family Medicine

## 2018-01-25 VITALS — BP 130/88 | HR 99 | Resp 15 | Ht 71.0 in

## 2018-01-25 DIAGNOSIS — I10 Essential (primary) hypertension: Secondary | ICD-10-CM

## 2018-01-25 DIAGNOSIS — R7302 Impaired glucose tolerance (oral): Secondary | ICD-10-CM

## 2018-01-25 DIAGNOSIS — I82442 Acute embolism and thrombosis of left tibial vein: Secondary | ICD-10-CM | POA: Diagnosis not present

## 2018-01-25 DIAGNOSIS — Z23 Encounter for immunization: Secondary | ICD-10-CM | POA: Diagnosis not present

## 2018-01-25 DIAGNOSIS — J309 Allergic rhinitis, unspecified: Secondary | ICD-10-CM

## 2018-01-25 NOTE — Patient Instructions (Addendum)
F/U in 6 months, call if you need me before  Flu vaccine today  You need to see a Podiatrist regarding the pain and swelling of great toe today, we will call Dr Tasia Catchings foot and ankle  You are being referred to Hematology re DVT long term plan, since no underlying cause noted  Blood thinner will be prescribed for an additional 4 monts, do NOT stop this until completed and told to do so by Doc \

## 2018-02-01 ENCOUNTER — Telehealth: Payer: Self-pay | Admitting: Family Medicine

## 2018-02-01 NOTE — Telephone Encounter (Signed)
Please advise 

## 2018-02-01 NOTE — Telephone Encounter (Signed)
Mother is calling in for pt to ask if he can have something else for the Gout, and can he take it along with the injections.

## 2018-02-02 ENCOUNTER — Encounter: Payer: Self-pay | Admitting: Podiatry

## 2018-02-02 ENCOUNTER — Ambulatory Visit (INDEPENDENT_AMBULATORY_CARE_PROVIDER_SITE_OTHER): Payer: BLUE CROSS/BLUE SHIELD

## 2018-02-02 ENCOUNTER — Ambulatory Visit: Payer: BLUE CROSS/BLUE SHIELD | Admitting: Podiatry

## 2018-02-02 ENCOUNTER — Encounter: Payer: Self-pay | Admitting: Family Medicine

## 2018-02-02 VITALS — BP 144/81 | HR 97

## 2018-02-02 DIAGNOSIS — M109 Gout, unspecified: Secondary | ICD-10-CM

## 2018-02-02 DIAGNOSIS — M779 Enthesopathy, unspecified: Secondary | ICD-10-CM

## 2018-02-02 DIAGNOSIS — R600 Localized edema: Secondary | ICD-10-CM | POA: Diagnosis not present

## 2018-02-02 MED ORDER — PREDNISONE 10 MG PO TABS: ORAL_TABLET | ORAL | 0 refills | Status: DC

## 2018-02-02 NOTE — Telephone Encounter (Signed)
Spoke with patient and let him know that Dr.Simpson would send him a message directly regarding the gout. He verbalized understanding.

## 2018-02-02 NOTE — Patient Instructions (Signed)

## 2018-02-02 NOTE — Telephone Encounter (Signed)
pls let her know that I have sent a direct message to her son, and please request office note from the Podiatrist ( Dr Elijah Birk foot and ankle) he saw  Last week so I can see management plan, thanks

## 2018-02-03 ENCOUNTER — Ambulatory Visit: Payer: BLUE CROSS/BLUE SHIELD | Admitting: Podiatry

## 2018-02-03 NOTE — Progress Notes (Signed)
Subjective:   Patient ID: Danny Gomez, male   DOB: 33 y.o.   MRN: [id number]   HPI Patient presents with severe discomfort in the left foot and has been tentatively diagnosed with gout and also did have a blood clot in the left lower leg and is currently undergoing anticoagulant therapy.  Patient is obese which is complicating factor and is on his feet and is trying to lose weight and is recently been on the diet.  States he has had previous injections by another physician which gave him mild relief and he is wearing surgical shoe and does not smoke and would like to be more active   Review of Systems  All other systems reviewed and are negative.       Objective:  Physical Exam Vitals signs and nursing note reviewed.  Constitutional:      Appearance: He is well-developed.  Pulmonary:     Effort: Pulmonary effort is normal.  Musculoskeletal: Normal range of motion.  Skin:    General: Skin is warm.  Neurological:     Mental Status: He is alert.     Patient was noted to have good neurovascular status and was found to have discomfort with swelling of the forefoot loaded to the midfoot with mild ankle swelling also noted and negative Homans sign currently.  Patient has pain is more diffuse currently and states that the steroid they had placed him on he did not take.  Patient does have good digital perfusion well oriented x3     Assessment:  Significant forefoot midfoot swelling left which may be due to gout or could be related to the clot but the patient had a lower leg left foot with pain that is commensurate with this     Plan:  H&P x-rays of both feet reviewed and at this point I did apply Unna boot to try to reduce the swelling mechanism along with Ace wrap and gave him instructions to leave it on for 3 to 4 days but to take it off earlier if any pain should occur.  I did place him back onto a Sterapred DS Dosepak to try to reduce the inflammatory cycle that he is in and I do  want to review his response over the next week and he will continue taking his allopurinol.  His uric acid was 7.8 at his check last week and we will continue to monitor this.  Difficult again to say whether or not this is more related to gout or the swelling associated with blood clot or combination  X-rays indicate that there is some spurring more on the right talus with no indications of stress fracture or acute bone injury condition

## 2018-02-04 ENCOUNTER — Telehealth: Payer: Self-pay | Admitting: *Deleted

## 2018-02-04 NOTE — Telephone Encounter (Signed)
Pts mother called wanting to know if the referral to the hematologist had been sent. She said no one had reached out to Danny Gomez. Wanted to see if they were going to call with an appt.

## 2018-02-05 ENCOUNTER — Encounter: Payer: Self-pay | Admitting: Family Medicine

## 2018-02-05 DIAGNOSIS — I82402 Acute embolism and thrombosis of unspecified deep veins of left lower extremity: Secondary | ICD-10-CM

## 2018-02-05 HISTORY — DX: Acute embolism and thrombosis of unspecified deep veins of left lower extremity: I82.402

## 2018-02-05 MED ORDER — RIVAROXABAN 20 MG PO TABS: 20.0000 mg | ORAL_TABLET | Freq: Every day | ORAL | 4 refills | Status: DC

## 2018-02-05 NOTE — Assessment & Plan Note (Signed)
Patient educated about the importance of limiting  Carbohydrate intake , the need to commit to daily physical activity for a minimum of 30 minutes , and to commit weight loss. The fact that changes in all these areas will reduce or eliminate all together the development of diabetes is stressed.  Updated lab needed at/ before next visit.   Diabetic Labs Latest Ref Rng & Units 01/19/2018 02/04/2017 01/31/2016 02/04/2015 12/20/2013  HbA1c <5.7 % - - - 5.6 5.8(H)  Chol <200 mg/dL - - 840 375 -  HDL >43 mg/dL - - 60(O) 77(C) -  Calc LDL <100 mg/dL - - 93 340 -  Triglycerides <150 mg/dL - - 352 64 -  Creatinine 0.61 - 1.24 mg/dL 4.81 8.59 0.93 1.12 1.62   BP/Weight 02/02/2018 01/25/2018 01/19/2018 01/12/2018 02/08/2017 02/04/2017 01/21/2017  Systolic BP 144 130 134 150 160 150 160  Diastolic BP 81 88 92 79 98 93 88  Wt. (Lbs) - - 340 340 - 363.1 361  BMI - 47.42 47.42 47.42 - 50.64 50.35   No flowsheet data found.

## 2018-02-05 NOTE — Telephone Encounter (Signed)
Referral has been entered for "oncology" which would be the hematologist, please schedule an appt with Provider in [place] where he lives and works, I sent him a message letting him know that we will work on this  ?? pls ask

## 2018-02-05 NOTE — Assessment & Plan Note (Signed)
Elevated above target blood pressure and currently uncontrolled, reports being in an excessive amount of pain DASH diet and commitment to daily physical activity for a minimum of 30 minutes discussed and encouraged, as a part of hypertension management. The importance of attaining a healthy weight is also discussed.  BP/Weight 02/02/2018 01/25/2018 01/19/2018 01/12/2018 02/08/2017 02/04/2017 01/21/2017  Systolic BP 144 130 134 150 160 150 160  Diastolic BP 81 88 92 79 98 93 88  Wt. (Lbs) - - 340 340 - 363.1 361  BMI - 47.42 47.42 47.42 - 50.64 50.35     F/u in 5 months, need to reduce salt and procesed foods snd continue to work on weight loss

## 2018-02-05 NOTE — Assessment & Plan Note (Signed)
Improved since last in the office, however needs to continue in the current direction for improved health. Patient re-educated about  the importance of commitment to a  minimum of 150 minutes of exercise per week.  The importance of healthy food choices with portion control discussed. Encouraged to start a food diary, count calories and to consider  joining a support group. Sample diet sheets offered. Goals set by the patient for the next several months.   Weight /BMI 01/25/2018 01/19/2018 01/12/2018  WEIGHT - 340 lb 340 lb  HEIGHT 5\' 11"  5\' 11"  5\' 11"   BMI 47.42 kg/m2 47.42 kg/m2 47.42 kg/m2

## 2018-02-05 NOTE — Progress Notes (Signed)
Danny Gomez     MRN: [id number]      DOB: 17-Oct-1985   HPI Danny Gomez is here for follow up of acute DVT left leg dx on 01/19/2018 still c/o excessive pain and inability to weight bear. C/o marked pain in great toe, no trauma, was dx with gout despite normal uric acid level and unfortunately as he is anti coagulated , NSAID and steroid are contraindicated due to risk of excessive bleeding Pt in pain and requesting additional help though he has hydrocodone this is not very beneficial currently Denies overt bleeding, and denies hemoptysis, and denies BRRB , hematuria, and melena. Denies dyspnea Concern as to whether there is any underlying pathology why he developed the clot , he will be anticoagulated for 4 months, and is now on the starter pack. After discussion with his Mother, decision is taken to have hematology eval to help to  determine his risk of recurrence and therefore the duration of anticoagulation ROS Denies recent fever or chills. Denies sinus pressure, nasal congestion, ear pain or sore throat. Denies chest congestion, productive cough or wheezing. Denies chest pains, palpitations and leg swelling Denies abdominal pain, nausea, vomiting,diarrhea or constipation.   Denies dysuria, frequency, hesitancy or incontinence.  Denies headaches, seizures, numbness, or tingling. Denies depression,  Denies skin break down or rash.   PE  BP 130/88   Pulse 99   Resp 15   Ht 5\' 11"  (1.803 m)   SpO2 97%   BMI 47.42 kg/m   Patient alert and oriented and in no cardiopulmonary distress.in significant pain, unable to weight bear on left leg due to left great toe pain   HEENT: No facial asymmetry, EOMI,   oropharynx pink and moist.  Neck supple no JVD, no mass.  Chest: Clear to auscultation bilaterally.  CVS: S1, S2 no murmurs, no S3.Regular rate.  Ext: Erythema and edema of left calf with tenderness  MS: Adequate ROM spine, shoulders, hips and knees.Tenderness and swelling of  MP joint of left great toe, no skin breakdown,  ingrown nail or drainage noted  Skin: Intact, no ulcerations or rash noted.  Psych: Good eye contact, normal affect. Memory intact  anxious not depressed appearing.  CNS: CN 2-12 intact, power,  normal throughout.no focal deficits noted.   Assessment & Plan  Allergic rhinitis       Essential hypertension Elevated above target blood pressure and currently uncontrolled, reports being in an excessive amount of pain DASH diet and commitment to daily physical activity for a minimum of 30 minutes discussed and encouraged, as a part of hypertension management. The importance of attaining a healthy weight is also discussed.  BP/Weight 02/02/2018 01/25/2018 01/19/2018 01/12/2018 02/08/2017 02/04/2017 01/21/2017  Systolic BP 144 130 134 150 160 150 160  Diastolic BP 81 88 92 79 98 93 88  Wt. (Lbs) - - 340 340 - 363.1 361  BMI - 47.42 47.42 47.42 - 50.64 50.35     F/u in 5 months, need to reduce salt and procesed foods snd continue to work on weight loss  Morbid obesity Improved since last in the office, however needs to continue in the current direction for improved health. Patient re-educated about  the importance of commitment to a  minimum of 150 minutes of exercise per week.  The importance of healthy food choices with portion control discussed. Encouraged to start a food diary, count calories and to consider  joining a support group. Sample diet sheets offered. Goals set by  the patient for the next several months.   Weight /BMI 01/25/2018 01/19/2018 01/12/2018  WEIGHT - 340 lb 340 lb  HEIGHT 5\' 11"  5\' 11"  5\' 11"   BMI 47.42 kg/m2 47.42 kg/m2 47.42 kg/m2      IGT (impaired glucose tolerance) Patient educated about the importance of limiting  Carbohydrate intake , the need to commit to daily physical activity for a minimum of 30 minutes , and to commit weight loss. The fact that changes in all these areas will reduce or eliminate all  together the development of diabetes is stressed.  Updated lab needed at/ before next visit.   Diabetic Labs Latest Ref Rng & Units 01/19/2018 02/04/2017 01/31/2016 02/04/2015 12/20/2013  HbA1c <5.7 % - - - 5.6 5.8(H)  Chol <200 mg/dL - - 770 340 -  HDL >35 mg/dL - - 24(E) 18(H) -  Calc LDL <100 mg/dL - - 93 909 -  Triglycerides <150 mg/dL - - 311 64 -  Creatinine 0.61 - 1.24 mg/dL 2.16 2.44 6.95 0.72 2.57   BP/Weight 02/02/2018 01/25/2018 01/19/2018 01/12/2018 02/08/2017 02/04/2017 01/21/2017  Systolic BP 144 130 134 150 160 150 160  Diastolic BP 81 88 92 79 98 93 88  Wt. (Lbs) - - 340 340 - 363.1 361  BMI - 47.42 47.42 47.42 - 50.64 50.35   No flowsheet data found.

## 2018-02-07 ENCOUNTER — Telehealth: Payer: Self-pay | Admitting: Internal Medicine

## 2018-02-07 NOTE — Telephone Encounter (Signed)
A new hem appt has been scheduled for the pt to see Dr. Melton Alar on 2/13at 1050am. Pt aware to arrive 30 minutes early.

## 2018-02-07 NOTE — Telephone Encounter (Signed)
Pt has been referred to [facility], per the request of his mother Lorenza Chick.

## 2018-02-09 ENCOUNTER — Encounter: Payer: Self-pay | Admitting: Podiatry

## 2018-02-09 ENCOUNTER — Ambulatory Visit (INDEPENDENT_AMBULATORY_CARE_PROVIDER_SITE_OTHER): Payer: BLUE CROSS/BLUE SHIELD | Admitting: Podiatry

## 2018-02-09 DIAGNOSIS — M779 Enthesopathy, unspecified: Secondary | ICD-10-CM | POA: Diagnosis not present

## 2018-02-09 DIAGNOSIS — R600 Localized edema: Secondary | ICD-10-CM | POA: Diagnosis not present

## 2018-02-09 DIAGNOSIS — M109 Gout, unspecified: Secondary | ICD-10-CM | POA: Diagnosis not present

## 2018-02-09 NOTE — Progress Notes (Signed)
Subjective:   Patient ID: Danny Gomez, male   DOB: 33 y.o.   MRN: [id number]   HPI Patient states feeling a lot better with significant reduction of the swelling I am very happy with how well I did   ROS      Objective:  Physical Exam  Neurovascular status intact with patient's left foot doing very well with the swelling reducing and minimal discomfort in the posterior heel or calf muscle and no acute inflammatory condition currently     Assessment:  Doing well post possible clot versus gout-like symptomatology     Plan:  H&P condition reviewed at this point I applied Unna boot to reduce the swelling along with Ace wrap and I dispensed 2 ankle compression stockings for the long-term.  Patient is discharged currently but will be seen back if any symptoms should reactivate

## 2018-02-17 ENCOUNTER — Telehealth: Payer: Self-pay

## 2018-02-17 NOTE — Telephone Encounter (Signed)
Patient called this morning to ask if it was ok for him to take Tylenol Arthritis with Xarelto. Looked at patient's allergies, no allergies listed, asked if he was allergic to tylenol, answer was no. Tylenol arthritis is listed on his med list. Advised patient that it was ok for him to take tylenol arthritis as written on the bottle Also checked for drug interactions for Xarelto and tylenol arthritis, none found

## 2018-03-03 ENCOUNTER — Inpatient Hospital Stay: Payer: BLUE CROSS/BLUE SHIELD | Attending: Internal Medicine | Admitting: Internal Medicine

## 2018-03-03 ENCOUNTER — Inpatient Hospital Stay: Payer: BLUE CROSS/BLUE SHIELD

## 2018-03-03 VITALS — BP 145/86 | HR 69 | Temp 97.7°F | Resp 20 | Ht 71.0 in | Wt 341.9 lb

## 2018-03-03 DIAGNOSIS — D6852 Prothrombin gene mutation: Secondary | ICD-10-CM | POA: Insufficient documentation

## 2018-03-03 DIAGNOSIS — Z7901 Long term (current) use of anticoagulants: Secondary | ICD-10-CM | POA: Insufficient documentation

## 2018-03-03 DIAGNOSIS — Z79899 Other long term (current) drug therapy: Secondary | ICD-10-CM | POA: Insufficient documentation

## 2018-03-03 DIAGNOSIS — I82492 Acute embolism and thrombosis of other specified deep vein of left lower extremity: Secondary | ICD-10-CM

## 2018-03-03 DIAGNOSIS — M25552 Pain in left hip: Secondary | ICD-10-CM

## 2018-03-03 DIAGNOSIS — M109 Gout, unspecified: Secondary | ICD-10-CM

## 2018-03-03 DIAGNOSIS — M1990 Unspecified osteoarthritis, unspecified site: Secondary | ICD-10-CM | POA: Insufficient documentation

## 2018-03-03 DIAGNOSIS — I10 Essential (primary) hypertension: Secondary | ICD-10-CM | POA: Diagnosis not present

## 2018-03-03 DIAGNOSIS — D6851 Activated protein C resistance: Secondary | ICD-10-CM | POA: Diagnosis not present

## 2018-03-03 DIAGNOSIS — I82402 Acute embolism and thrombosis of unspecified deep veins of left lower extremity: Secondary | ICD-10-CM | POA: Insufficient documentation

## 2018-03-03 LAB — COMPREHENSIVE METABOLIC PANEL
ALT: 17 U/L (ref 0–44)
AST: 15 U/L (ref 15–41)
Albumin: 4 g/dL (ref 3.5–5.0)
Alkaline Phosphatase: 61 U/L (ref 38–126)
Anion gap: 7 (ref 5–15)
BUN: 8 mg/dL (ref 6–20)
CO2: 31 mmol/L (ref 22–32)
Calcium: 9.6 mg/dL (ref 8.9–10.3)
Chloride: 100 mmol/L (ref 98–111)
Creatinine, Ser: 0.95 mg/dL (ref 0.61–1.24)
GFR calc Af Amer: >60 mL/min (ref >60)
GFR calc non Af Amer: >60 mL/min (ref >60)
Glucose, Bld: 92 mg/dL (ref 70–99)
Potassium: 3.6 mmol/L (ref 3.5–5.1)
Sodium: 138 mmol/L (ref 135–145)
Total Bilirubin: 0.4 mg/dL (ref 0.3–1.2)
Total Protein: 7.8 g/dL (ref 6.5–8.1)

## 2018-03-03 LAB — CBC WITH DIFFERENTIAL/PLATELET
Abs Immature Granulocytes: 0.01 10*3/uL (ref 0.00–0.07)
BASOS ABS: 0 10*3/uL (ref 0.0–0.1)
Basophils Relative: 0 %
EOS PCT: 1 %
Eosinophils Absolute: 0.1 10*3/uL (ref 0.0–0.5)
HCT: 44.7 % (ref 39.0–52.0)
Hemoglobin: 14.5 g/dL (ref 13.0–17.0)
Immature Granulocytes: 0 %
LYMPHS PCT: 25 %
Lymphs Abs: 1.7 10*3/uL (ref 0.7–4.0)
MCH: 28.4 pg (ref 26.0–34.0)
MCHC: 32.4 g/dL (ref 30.0–36.0)
MCV: 87.5 fL (ref 80.0–100.0)
Monocytes Absolute: 0.6 10*3/uL (ref 0.1–1.0)
Monocytes Relative: 9 %
Neutro Abs: 4.4 10*3/uL (ref 1.7–7.7)
Neutrophils Relative %: 65 %
Platelets: 323 10*3/uL (ref 150–400)
RBC: 5.11 MIL/uL (ref 4.22–5.81)
RDW: 15 % (ref 11.5–15.5)
WBC: 6.8 10*3/uL (ref 4.0–10.5)
nRBC: 0 % (ref 0.0–0.2)

## 2018-03-03 LAB — C-REACTIVE PROTEIN: CRP: <0.8 mg/dL (ref <1.0)

## 2018-03-03 LAB — LACTATE DEHYDROGENASE: LDH: 186 U/L (ref 98–192)

## 2018-03-03 LAB — FERRITIN: Ferritin: 113 ng/mL (ref 24–336)

## 2018-03-03 NOTE — Progress Notes (Signed)
Referring physician: Dr. Syliva Overman  Diagnosis Acute deep vein thrombosis (DVT) of other specified vein of left lower extremity (HCC) - Plan: CBC with Differential/Platelet, Comprehensive metabolic panel, Lactate dehydrogenase, Protein electrophoresis, serum, Ferritin, Rheumatoid factor, C-reactive protein, Factor 5 leiden, Prothrombin gene mutation, Beta-2-glycoprotein i abs, IgG/M/A, Lupus anticoagulant panel, ANA, IFA (with reflex), CT CHEST W CONTRAST, CT ABDOMEN PELVIS W CONTRAST  Pain in joint involving left pelvic region and thigh - Plan: CBC with Differential/Platelet, Comprehensive metabolic panel, Lactate dehydrogenase, Protein electrophoresis, serum, Ferritin, Rheumatoid factor, C-reactive protein, Factor 5 leiden, Prothrombin gene mutation, Beta-2-glycoprotein i abs, IgG/M/A, Lupus anticoagulant panel, ANA, IFA (with reflex), CT CHEST W CONTRAST, CT ABDOMEN PELVIS W CONTRAST  Staging Cancer Staging No matching staging information was found for the patient.  Assessment and Plan:  1.  Left LE DVT.  33 year old male referred for evaluation due to left DVT.  He denies any recent surgeries or frequent travels.  He reports he was having problems with gout and subsequently had an ultrasound which revealed  evidence of a clot.  He denies any smoking history.  He is currently on Xarelto.  Denies any family history of thrombosis.  Bilateral lower extremity Doppler done 01/19/2018 showed  Summary: Right: No evidence of common femoral vein obstruction. Left: Findings consistent with acute deep vein thrombosis involving the left posterior tibial vein, and left peroneal vein. No cystic structure found in the popliteal fossa.  Patient is seen today for consultation due to left lower extremity DVT.  Patient denies sedentary lifestyle.  He reports he exercises on a regular basis.  It is noted he weighs 342 pounds.  I discussed with him potential risk factors for thrombosis.  He will be set up for CT  chest abdomen pelvis for further evaluation for any evidence of occult malignancy.  I have discussed with him he will be recommended for 6 months of anticoagulation.  Patient will undergo hypercoagulable evaluation today.  He will return to clinic in 2 to 3 weeks to go over the results.  All questions answered and he expressed understanding of the information presented.  2.  Hypertension.  Blood pressure is 145/86.  Follow-up with PCP.  3.  Obesity.  It is noted the patient weighs 342 pounds.  He reports he exercises on a regular basis.    4.  Gout.  Pt is followed by Dr. Lodema Hong and podiatry.  Gout flare led to evaluation for DVT.  Recent uric acid level was 7.8.    5.  Health maintenance.  Follow-up with PCP as directed.  40 minutes spent with more than 50% spent in review of records, counseling and coordination of care.  HPI: 33 year old male referred for evaluation due to left DVT.  He denies any recent surgeries or frequent travels.  He reports he was having problems with gout and subsequently had an ultrasound which was failed evidence of a clot.  He denies any smoking history.  He is currently on Xarelto.  Denies any family history of thrombosis.  Bilateral lower extremity Doppler done 01/19/2018 showed  Summary: Right: No evidence of common femoral vein obstruction. Left: Findings consistent with acute deep vein thrombosis involving the left posterior tibial vein, and left peroneal vein. No cystic structure found in the popliteal fossa.  Patient is seen today for consultation due to left lower extremity DVT.  Problem List Patient Active Problem List   Diagnosis Date Noted  . Left leg DVT (HCC) [I82.402] 02/05/2018  . Back pain with right-sided  sciatica [M54.31] 01/24/2017  . IGT (impaired glucose tolerance) [R73.02] 02/04/2015  . Vitamin D deficiency [E55.9] 02/06/2013  . Allergic rhinitis [J30.9] 10/29/2009  . Morbid obesity (HCC) [E66.01] 08/26/2007  . Essential hypertension [I10]  08/26/2007    Past Medical History Past Medical History:  Diagnosis Date  . Asthma    pt. reports as a child he used an inhaler , but asthma has resoled in the adult path  . Febrile illness   . Headache(784.0)   . Hypertension   . Neuromuscular disorder (HCC)    hnp- L4-5, L5-S1  . Obesity     Past Surgical History Past Surgical History:  Procedure Laterality Date  . right ankle surgery  2002   bones had fused but it was corrected surgically  . TONSILLECTOMY      Family History Family History  Problem Relation Age of Onset  . Hypertension Mother   . Obesity Mother      Social History  reports that he has never smoked. He has never used smokeless tobacco. He reports current drug use. Drug: Marijuana. He reports that he does not drink alcohol.  Medications  Current Outpatient Medications:  .  acetaminophen (TYLENOL 8 HOUR ARTHRITIS PAIN) 650 MG CR tablet, Take 1,300 mg by mouth every 8 (eight) hours as needed for pain., Disp: , Rfl:  .  allopurinol (ZYLOPRIM) 100 MG tablet, Take 1 tablet (100 mg total) by mouth daily., Disp: 30 tablet, Rfl: 6 .  amLODipine (NORVASC) 5 MG tablet, Take 1 tablet (5 mg total) by mouth daily., Disp: 30 tablet, Rfl: 1 .  hydrocortisone cream 1 %, Apply 1 application topically 2 (two) times daily., Disp: 30 g, Rfl: 0 .  lidocaine (LIDODERM) 5 %, Place 1 patch onto the skin daily. Remove & Discard patch within 12 hours or as directed by MD, Disp: 30 patch, Rfl: 0 .  lisinopril-hydrochlorothiazide (PRINZIDE,ZESTORETIC) 20-25 MG tablet, Take 1 tablet by mouth daily., Disp: 90 tablet, Rfl: 1 .  rivaroxaban (XARELTO) 20 MG TABS tablet, Take 1 tablet (20 mg total) by mouth daily with supper., Disp: 30 tablet, Rfl: 4  Allergies Patient has no known allergies.  Review of Systems Review of Systems - Oncology ROS negative other than left foot discomfort that is not new and related to gout history.    Physical Exam  Vitals Wt Readings from Last 3  Encounters:  03/03/18 (!) 341 lb 14.4 oz (155.1 kg)  01/19/18 (!) 340 lb (154.2 kg)  01/12/18 (!) 340 lb (154.2 kg)   Temp Readings from Last 3 Encounters:  03/03/18 97.7 F (36.5 C) (Oral)  01/19/18 97.6 F (36.4 C) (Oral)  01/12/18 97.8 F (36.6 C) (Oral)   BP Readings from Last 3 Encounters:  03/03/18 (!) 145/86  02/02/18 (!) 144/81  01/25/18 130/88   Pulse Readings from Last 3 Encounters:  03/03/18 69  02/02/18 97  01/25/18 99   Constitutional: Well-developed, well-nourished, and in no distress.   HENT: Head: Normocephalic and atraumatic.  Mouth/Throat: No oropharyngeal exudate. Mucosa moist. Eyes: Pupils are equal, round, and reactive to light. Conjunctivae are normal. No scleral icterus.  Neck: Normal range of motion. Neck supple. No JVD present.  Cardiovascular: Normal rate, regular rhythm and normal heart sounds.  Exam reveals no gallop and no friction rub.   No murmur heard. Pulmonary/Chest: Effort normal and breath sounds normal. No respiratory distress. No wheezes.No rales.  Abdominal: Soft. Bowel sounds are normal. No distension. There is no tenderness. There is no guarding.  Musculoskeletal: No edema or tenderness.  Lymphadenopathy: No cervical,axillary or supraclavicular adenopathy.  Neurological: Alert and oriented to person, place, and time. No cranial nerve deficit.  Skin: Skin is warm and dry. No rash noted. No erythema. No pallor.  Psychiatric: Affect and judgment normal.   Labs Appointment on 03/03/2018  Component Date Value Ref Range Status  . Ferritin 03/03/2018 113  24 - 336 ng/mL Final   Performed at [facility], [address], [place], [place] [address]  . LDH 03/03/2018 186  98 - 192 U/L Final   Performed at [facility], [address], [place], [place] [address]  . Sodium 03/03/2018 138  135 - 145 mmol/L Final  . Potassium 03/03/2018 3.6  3.5 - 5.1 mmol/L Final  . Chloride 03/03/2018 100  98  - 111 mmol/L Final  . CO2 03/03/2018 31  22 - 32 mmol/L Final  . Glucose, Bld 03/03/2018 92  70 - 99 mg/dL Final  . BUN 73/53/2992 8  6 - 20 mg/dL Final  . Creatinine, Ser 03/03/2018 0.95  0.61 - 1.24 mg/dL Final  . Calcium 42/68/3419 9.6  8.9 - 10.3 mg/dL Final  . Total Protein 03/03/2018 7.8  6.5 - 8.1 g/dL Final  . Albumin 62/22/9798 4.0  3.5 - 5.0 g/dL Final  . AST 92/11/9415 15  15 - 41 U/L Final  . ALT 03/03/2018 17  0 - 44 U/L Final  . Alkaline Phosphatase 03/03/2018 61  38 - 126 U/L Final  . Total Bilirubin 03/03/2018 0.4  0.3 - 1.2 mg/dL Final  . GFR calc non Af Amer 03/03/2018 >60  >60 mL/min Final  . GFR calc Af Amer 03/03/2018 >60  >60 mL/min Final  . Anion gap 03/03/2018 7  5 - 15 Final   Performed at [facility], [address], [place], [place] [address]  . WBC 03/03/2018 6.8  4.0 - 10.5 K/uL Final  . RBC 03/03/2018 5.11  4.22 - 5.81 MIL/uL Final  . Hemoglobin 03/03/2018 14.5  13.0 - 17.0 g/dL Final  . HCT 48/18/5631 44.7  39.0 - 52.0 % Final  . MCV 03/03/2018 87.5  80.0 - 100.0 fL Final  . MCH 03/03/2018 28.4  26.0 - 34.0 pg Final  . MCHC 03/03/2018 32.4  30.0 - 36.0 g/dL Final  . RDW 49/70/2637 15.0  11.5 - 15.5 % Final  . Platelets 03/03/2018 323  150 - 400 K/uL Final  . nRBC 03/03/2018 0.0  0.0 - 0.2 % Final  . Neutrophils Relative % 03/03/2018 65  % Final  . Neutro Abs 03/03/2018 4.4  1.7 - 7.7 K/uL Final  . Lymphocytes Relative 03/03/2018 25  % Final  . Lymphs Abs 03/03/2018 1.7  0.7 - 4.0 K/uL Final  . Monocytes Relative 03/03/2018 9  % Final  . Monocytes Absolute 03/03/2018 0.6  0.1 - 1.0 K/uL Final  . Eosinophils Relative 03/03/2018 1  % Final  . Eosinophils Absolute 03/03/2018 0.1  0.0 - 0.5 K/uL Final  . Basophils Relative 03/03/2018 0  % Final  . Basophils Absolute 03/03/2018 0.0  0.0 - 0.1 K/uL Final  . Immature Granulocytes 03/03/2018 0  % Final  . Abs Immature Granulocytes 03/03/2018 0.01  0.00 - 0.07 K/uL Final   Performed  at [facility], [address], [place], [place] [address]     Pathology Orders Placed This Encounter  Procedures  . CT CHEST W CONTRAST  Standing Status:   Future    Standing Expiration Date:   03/03/2019    Order Specific Question:   If indicated for the ordered procedure, I authorize the administration of contrast media per Radiology protocol    Answer:   Yes    Order Specific Question:   Preferred imaging location?    Answer[facility]    Order Specific Question:   Radiology Contrast Protocol - do NOT remove file path    Answer:   [email or web address]    Order Specific Question:   ** REASON FOR EXAM (FREE TEXT)    Answer:   evaluate for occult malignancy  . CT ABDOMEN PELVIS W CONTRAST    Standing Status:   Future    Standing Expiration Date:   03/03/2019    Order Specific Question:   ** REASON FOR EXAM (FREE TEXT)    Answer:   evaluate for occult malignancy    Order Specific Question:   If indicated for the ordered procedure, I authorize the administration of contrast media per Radiology protocol    Answer:   Yes    Order Specific Question:   Preferred imaging [facility]?    [facility]    Order Specific Question:   Is Oral Contrast requested for this exam?    Answer:   Yes, Per Radiology protocol    Order Specific Question:   Radiology Contrast Protocol - do NOT remove file path    Answer:   [email or web address]  . CBC with Differential/Platelet    Standing Status:   Future    Number of Occurrences:   1    Standing Expiration Date:   03/04/2019  . Comprehensive metabolic panel    Standing Status:   Future    Number of Occurrences:   1    Standing Expiration Date:   03/04/2019  . Lactate dehydrogenase    Standing Status:   Future    Number of Occurrences:   1    Standing Expiration Date:   03/04/2019  . Protein electrophoresis, serum    Standing Status:   Future     Number of Occurrences:   1    Standing Expiration Date:   03/04/2019  . Ferritin    Standing Status:   Future    Number of Occurrences:   1    Standing Expiration Date:   03/04/2019  . Rheumatoid factor    Standing Status:   Future    Number of Occurrences:   1    Standing Expiration Date:   03/04/2019  . C-reactive protein    Standing Status:   Future    Number of Occurrences:   1    Standing Expiration Date:   03/04/2019  . Factor 5 leiden    Standing Status:   Future    Number of Occurrences:   1    Standing Expiration Date:   03/04/2019  . Prothrombin gene mutation    Standing Status:   Future    Number of Occurrences:   1    Standing Expiration Date:   03/04/2019  . Beta-2-glycoprotein i abs, IgG/M/A    Standing Status:   Future    Number of Occurrences:   1    Standing Expiration Date:   03/04/2019  . Lupus anticoagulant panel    Standing Status:   Future    Number of Occurrences:   1    Standing Expiration Date:   03/04/2019  .  ANA, IFA (with reflex)    Standing Status:   Future    Number of Occurrences:   1    Standing Expiration Date:   03/04/2019       Ahmed Prima MD

## 2018-03-04 LAB — PROTEIN ELECTROPHORESIS, SERUM
A/G Ratio: 0.9 (ref 0.7–1.7)
ALBUMIN ELP: 3.6 g/dL (ref 2.9–4.4)
Alpha-1-Globulin: 0.3 g/dL (ref 0.0–0.4)
Alpha-2-Globulin: 0.9 g/dL (ref 0.4–1.0)
Beta Globulin: 1.2 g/dL (ref 0.7–1.3)
Gamma Globulin: 1.4 g/dL (ref 0.4–1.8)
Globulin, Total: 3.8 g/dL (ref 2.2–3.9)
Total Protein ELP: 7.4 g/dL (ref 6.0–8.5)

## 2018-03-04 LAB — ANTINUCLEAR ANTIBODIES, IFA: ANA Ab, IFA: NEGATIVE

## 2018-03-04 LAB — RHEUMATOID FACTOR: Rheumatoid fact SerPl-aCnc: <10 IU/mL (ref 0.0–13.9)

## 2018-03-05 LAB — LUPUS ANTICOAGULANT PANEL
DRVVT: 125.7 s — ABNORMAL HIGH (ref 0.0–47.0)
PTT LA: 47 s (ref 0.0–51.9)

## 2018-03-05 LAB — DRVVT MIX: dRVVT Mix: 80.1 s — ABNORMAL HIGH (ref 0.0–47.0)

## 2018-03-05 LAB — BETA-2-GLYCOPROTEIN I ABS, IGG/M/A
Beta-2 Glyco I IgG: <9 GPI IgG units (ref 0–20)
Beta-2-Glycoprotein I IgA: <9 GPI IgA units (ref 0–25)

## 2018-03-05 LAB — DRVVT CONFIRM: dRVVT Confirm: 2 ratio — ABNORMAL HIGH (ref 0.8–1.2)

## 2018-03-08 LAB — FACTOR 5 LEIDEN

## 2018-03-09 ENCOUNTER — Encounter: Payer: Self-pay | Admitting: Family Medicine

## 2018-03-09 LAB — PROTHROMBIN GENE MUTATION

## 2018-03-10 ENCOUNTER — Other Ambulatory Visit: Payer: Self-pay | Admitting: Family Medicine

## 2018-03-10 MED ORDER — RIVAROXABAN 20 MG PO TABS: 20.0000 mg | ORAL_TABLET | Freq: Every day | ORAL | 0 refills | Status: DC

## 2018-03-25 ENCOUNTER — Other Ambulatory Visit: Payer: Self-pay

## 2018-03-25 ENCOUNTER — Inpatient Hospital Stay: Payer: BLUE CROSS/BLUE SHIELD | Attending: Internal Medicine | Admitting: Internal Medicine

## 2018-03-25 ENCOUNTER — Telehealth: Payer: Self-pay | Admitting: Internal Medicine

## 2018-03-25 VITALS — BP 162/83 | HR 78 | Temp 98.0°F | Resp 18 | Ht 71.0 in | Wt 341.0 lb

## 2018-03-25 DIAGNOSIS — I10 Essential (primary) hypertension: Secondary | ICD-10-CM | POA: Diagnosis not present

## 2018-03-25 DIAGNOSIS — Z7901 Long term (current) use of anticoagulants: Secondary | ICD-10-CM

## 2018-03-25 DIAGNOSIS — I82492 Acute embolism and thrombosis of other specified deep vein of left lower extremity: Secondary | ICD-10-CM

## 2018-03-25 DIAGNOSIS — M109 Gout, unspecified: Secondary | ICD-10-CM | POA: Insufficient documentation

## 2018-03-25 DIAGNOSIS — Z79899 Other long term (current) drug therapy: Secondary | ICD-10-CM | POA: Insufficient documentation

## 2018-03-25 DIAGNOSIS — Z8249 Family history of ischemic heart disease and other diseases of the circulatory system: Secondary | ICD-10-CM

## 2018-03-25 DIAGNOSIS — D6862 Lupus anticoagulant syndrome: Secondary | ICD-10-CM

## 2018-03-25 DIAGNOSIS — I82402 Acute embolism and thrombosis of unspecified deep veins of left lower extremity: Secondary | ICD-10-CM | POA: Diagnosis not present

## 2018-03-25 NOTE — Telephone Encounter (Signed)
Gave avs and calendar ° °

## 2018-03-25 NOTE — Progress Notes (Signed)
Diagnosis Acute deep vein thrombosis (DVT) of other specified vein of left lower extremity (HCC) - Plan: CBC with Differential (Cancer Center Only), CMP (Cancer Center only), Lactate dehydrogenase (LDH), Lupus anticoagulant panel, VAS [place] LOWER EXTREMITY VENOUS (DVT)  Staging Cancer Staging No matching staging information was found for the patient.  Assessment and Plan:   1.  Left LE DVT.  33 year old male referred for evaluation due to left DVT.  He denies any recent surgeries or frequent travels.  He reports he was having problems with gout and subsequently had an ultrasound which revealed  evidence of a clot.  He denies any smoking history.  He is currently on Xarelto.  Denies any family history of thrombosis.  Bilateral lower extremity Doppler done 01/19/2018 showed  Summary: Right: No evidence of common femoral vein obstruction. Left: Findings consistent with acute deep vein thrombosis involving the left posterior tibial vein, and left peroneal vein. No cystic structure found in the popliteal fossa.  Patient denies sedentary lifestyle.  He reports he exercises on a regular basis.  It is noted he weighs 342 pounds.  Previously  I discussed with him potential risk factors for thrombosis.   Labs done 03/03/2018 reviewed and showed WBC 6.8 HB 14.5 plts 323,000.  Chemistries WNL with K+ 3.6 Cr 0.95 and normal LFTs.  LDH 186 Ferritin 113 CRP < 0.8, SPEP negative, negative B2 Glycoprotein, Factor V Leiden negative, Prothrombin gene mutation negative, ANA negative, RF negative, Lupus anticoagulant shows results are consistent with the presence of a lupus anticoagulant.  NOTE: Only persistent lupus anticoagulants are thought to be of  clinical significance. For this reason, repeat testing in 12 or more weeks  after an initial positive result should be considered to confirm or refute the  presence of a lupus anticoagulant,   Pt was sent up for CT chest abdomen pelvis for further evaluation for any  evidence of occult malignancy.  He has not had imaging done.  Have discussed with pt scans will again be set up and he will be notified of results.   Pt currently recommended for 6 months of anticoagulation.  He will be set up for bilateral LE doppler in 07/2018.  He will follow-up at that time with labs and will repeat Lupus anticoagulant testing in 07/2018.   Pt is advised to not have Tattoos placed while on anticoagulation.  All questions answered and he expressed understanding of the information presented.  2.  Hypertension.  Blood pressure is 162/83.  Follow-up with PCP.  3.  Obesity.  It is noted the patient weighs 342 pounds.  He reports he exercises on a regular basis.    4.  Gout.  Pt is followed by Dr. Lodema Hong and podiatry.  Gout flare led to evaluation for DVT.  Recent uric acid level was 7.8.    5.  Health maintenance.  Follow-up with PCP as directed.  25 minutes spent with more than 50% spent in review of records, counseling and coordination of care.  Interval History:  Historical data obtained from note dated 03/03/2018.   32 year old male referred for evaluation due to left DVT.  He denies any recent surgeries or frequent travels.  He reports he was having problems with gout and subsequently had an ultrasound which was failed evidence of a clot.  He denies any smoking history.  He is currently on Xarelto.  Denies any family history of thrombosis.  Bilateral lower extremity Doppler done 01/19/2018 showed  Summary: Right: No evidence of common femoral  vein obstruction. Left: Findings consistent with acute deep vein thrombosis involving the left posterior tibial vein, and left peroneal vein. No cystic structure found in the popliteal fossa.  Current Status:  Pt is seen today for follow-up.  He is here to go over labs.  He has not done CT scans.  He is wondering if he can get a Tattoo.   Problem List Patient Active Problem List   Diagnosis Date Noted  . Left leg DVT (HCC) [I82.402]  02/05/2018  . Back pain with right-sided sciatica [M54.31] 01/24/2017  . IGT (impaired glucose tolerance) [R73.02] 02/04/2015  . Vitamin D deficiency [E55.9] 02/06/2013  . Allergic rhinitis [J30.9] 10/29/2009  . Morbid obesity (HCC) [E66.01] 08/26/2007  . Essential hypertension [I10] 08/26/2007    Past Medical History Past Medical History:  Diagnosis Date  . Asthma    pt. reports as a child he used an inhaler , but asthma has resoled in the adult path  . Febrile illness   . Headache(784.0)   . Hypertension   . Neuromuscular disorder (HCC)    hnp- L4-5, L5-S1  . Obesity     Past Surgical History Past Surgical History:  Procedure Laterality Date  . right ankle surgery  2002   bones had fused but it was corrected surgically  . TONSILLECTOMY      Family History Family History  Problem Relation Age of Onset  . Hypertension Mother   . Obesity Mother      Social History  reports that he has never smoked. He has never used smokeless tobacco. He reports current drug use. Drug: Marijuana. He reports that he does not drink alcohol.  Medications  Current Outpatient Medications:  .  acetaminophen (TYLENOL 8 HOUR ARTHRITIS PAIN) 650 MG CR tablet, Take 1,300 mg by mouth every 8 (eight) hours as needed for pain., Disp: , Rfl:  .  allopurinol (ZYLOPRIM) 100 MG tablet, Take 1 tablet (100 mg total) by mouth daily., Disp: 30 tablet, Rfl: 6 .  amLODipine (NORVASC) 5 MG tablet, Take 1 tablet (5 mg total) by mouth daily., Disp: 30 tablet, Rfl: 1 .  lisinopril-hydrochlorothiazide (PRINZIDE,ZESTORETIC) 20-25 MG tablet, Take 1 tablet by mouth daily., Disp: 90 tablet, Rfl: 1 .  rivaroxaban (XARELTO) 20 MG TABS tablet, Take 1 tablet (20 mg total) by mouth daily with supper., Disp: 30 tablet, Rfl: 4 .  [START ON 06/09/2018] rivaroxaban (XARELTO) 20 MG TABS tablet, Take 1 tablet (20 mg total) by mouth daily with supper for 30 days., Disp: 30 tablet, Rfl: 0  Allergies Patient has no known  allergies.  Review of Systems Review of Systems - Oncology ROS negative   Physical Exam  Vitals Wt Readings from Last 3 Encounters:  03/25/18 (!) 341 lb (154.7 kg)  03/03/18 (!) 341 lb 14.4 oz (155.1 kg)  01/19/18 (!) 340 lb (154.2 kg)   Temp Readings from Last 3 Encounters:  03/25/18 98 F (36.7 C) (Oral)  03/03/18 97.7 F (36.5 C) (Oral)  01/19/18 97.6 F (36.4 C) (Oral)   BP Readings from Last 3 Encounters:  03/25/18 (!) 162/83  03/03/18 (!) 145/86  02/02/18 (!) 144/81   Pulse Readings from Last 3 Encounters:  03/25/18 78  03/03/18 69  02/02/18 97   Constitutional: Well-developed, well-nourished, and in no distress.   HENT: Head: Normocephalic and atraumatic.  Mouth/Throat: No oropharyngeal exudate. Mucosa moist. Eyes: Pupils are equal, round, and reactive to light. Conjunctivae are normal. No scleral icterus.  Neck: Normal range of motion. Neck supple.  No JVD present.  Cardiovascular: Normal rate, regular rhythm and normal heart sounds.  Exam reveals no gallop and no friction rub.   No murmur heard. Pulmonary/Chest: Effort normal and breath sounds normal. No respiratory distress. No wheezes.No rales.  Abdominal: Soft. Bowel sounds are normal. No distension. There is no tenderness. There is no guarding.  Musculoskeletal: No edema or tenderness.  Lymphadenopathy: No cervical, axillary or supraclavicular adenopathy.  Neurological: Alert and oriented to person, place, and time. No cranial nerve deficit.  Skin: Skin is warm and dry. No rash noted. No erythema. No pallor.  Psychiatric: Affect and judgment normal.   Labs No visits with results within 3 Day(s) from this visit.  Latest known visit with results is:  Clinical Support on 03/03/2018  Component Date Value Ref Range Status  . ANA Ab, IFA 03/03/2018 Negative   Final   Comment: (NOTE)                                     Negative   <1:80                                     Borderline  1:80                                      Positive   >1:80 Performed At: [facility] [address] [place], [place] [id number] Jolene Schimke MD UX:[id number]   . PTT Lupus Anticoagulant 03/03/2018 47.0  0.0 - 51.9 sec Final  . DRVVT 03/03/2018 125.7* 0.0 - 47.0 sec Final  . Lupus Anticoag Interp 03/03/2018 Comment:   Corrected   Comment: (NOTE) Results are consistent with the presence of a lupus anticoagulant. NOTE: Only persistent lupus anticoagulants are thought to be of clinical significance. For this reason, repeat testing in 12 or more weeks after an initial positive result should be considered to confirm or refute the presence of a lupus anticoagulant, depending on clinical presentation. Results of lupus anticoagulant tests may be falsely positive in the presence of certain anticoagulant therapies. Performed At: [facility] [address] [place], [place] [id number] Jolene Schimke MD VQ:[id number]   . Beta-2 Glyco I IgG 03/03/2018 <9  0 - 20 GPI IgG units Final   Comment: (NOTE) The reference interval reflects a 3SD or 99th percentile interval, which is thought to represent a potentially clinically significant result in accordance with the International Consensus Statement on the classification criteria for definitive antiphospholipid syndrome (APS). J Thromb Haem 2006;4:295-306.   . Beta-2-Glycoprotein I IgM 03/03/2018 <9  0 - 32 GPI IgM units Final   Comment: (NOTE) The reference interval reflects a 3SD or 99th percentile interval, which is thought to represent a potentially clinically significant result in accordance with the International Consensus Statement on the classification criteria for definitive antiphospholipid syndrome (APS). J Thromb Haem 2006;4:295-306. Performed At: [facility] [address] [place], [place] [id number] Jolene Schimke MD CZ:[id number]   . Beta-2-Glycoprotein I IgA 03/03/2018 <9  0 - 25 GPI IgA units Final   Comment: (NOTE) The  reference interval reflects a 3SD or 99th percentile interval, which is thought to represent a potentially clinically significant result in accordance with the International Consensus Statement on the classification criteria for definitive antiphospholipid  syndrome (APS). J Thromb Haem 2006;4:295-306.   [name] Kitchen Recommendations-PTGENE: 03/03/2018 Comment   Final   Comment: (NOTE) NEGATIVE No mutation identified. Comment: A point mutation ([id number]) in the factor II (prothrombin) gene is the second most common cause of inherited thrombophilia. The incidence of this mutation in the [place] Caucasian population is about 2% and in the [place] American population it is approximately 0.5%. This mutation is rare in the [place] and Native American population. Being heterozygous for a prothrombin mutation increases the risk for developing venous thrombosis about 2 to 3 times above the general population risk. Being homozygous for the prothrombin gene mutation increases the relative risk for venous thrombosis further, although it is not yet known how much further the risk is increased. In women heterozygous for the prothrombin gene mutation, the use of estrogen containing oral contraceptives increases the relative risk of venous thrombosis about 16 times and the risk of developing cerebral thrombosis is also significantly increased. In pregnancy the pr                          othrombin gene mutation increases risk for venous thrombosis and may increase risk for stillbirth, placental abruption, pre-eclampsia and fetal growth restriction. If the patient possesses two or more congenital or acquired thrombophilic risk factors, the risk for thrombosis may rise to more than the sum of the risk ratios for the individual mutations. This assay detects only the prothrombin G20210A mutation and does not measure genetic abnormalities elsewhere in the genome. Other thrombotic risk factors may be pursued  through systematic clinical laboratory analysis. These factors include the R506Q (Leiden) mutation in the Factor V gene, plasma homocysteine levels, as well as testing for deficiencies of antithrombin III, protein C and protein S. Genetic Counselors are available for health care providers to discuss results at [phone] [phone]). Methodology: DNA analysis of the Factor II gene was performed by PCR amplification followed by restriction analysis. The di                          agnostic sensitivity is >99% for both. All the tests must be combined with clinical information for the most accurate interpretation. Molecular-based testing is highly accurate, but as in any laboratory test, diagnostic errors may occur. This test was developed and its performance characteristics determined by [facility]. It has not been cleared or approved by the Food and Drug Administration. Poort SR, et al. Blood. 1996; 59:1638-4665. Varga EA. Circulation. 2004; 110:e15-e18. [name] Kitchen, et al. Arterioscler Thromb Vasc Biol. [phone]. Vincenza Hews, PhD, [facility] Ernestene Mention, PhD, [facility] Wyatt Portela, M.S., PhD, [facility] Manya Silvas, PhD, [facility] Bonnielee Haff, PhD, [facility] Alpha Gula, PhD, [facility] Performed At: [facility] [address] [place], [place] [id number] Oley Balm MD QT:[id number]   . Recommendations-F5LEID: 03/03/2018 Comment   Final   Comment: (NOTE) Result:  Negative (no mutation found) Factor V Leiden is a specific mutation (R506Q) in the factor V gene that is associated with an increased risk of venous thrombosis. Factor V Leiden is more resistant to inactivation by activated protein C.  As a result, factor V persists in the circulation leading to a mild hyper- coagulable state.  The Leiden mutation accounts for 90% - 95% of APC resistance.  Factor V Leiden has been reported in patients with deep vein thrombosis, pulmonary embolus, central retinal vein  occlusion, cerebral sinus thrombosis and hepatic vein thrombosis. Other  risk factors to be considered in the workup for venous thrombosis include the G20210A mutation in the factor II (prothrombin) gene, protein S and C deficiency, and antithrombin deficiencies. Anticardiolipin antibody and lupus anticoagulant analysis may be appropriate for certain patients, as well as homocysteine levels. Contact your local [facility] for information on how to order additi                          onal testing if desired. **Genetic counselors are available for health care providers to**  discuss results at [phone]-GENE [phone]). Methodology: DNA analysis of the Factor V gene was performed by allele-specific PCR. The diagnostic sensitivity and specificity is >99% for both. Molecular-based testing is highly accurate, but as in any laboratory test, diagnostic errors may occur. All test results must be combined with clinical information for the most accurate interpretation. This test was developed and its performance characteristics determined by [facility]. It has not been cleared or approved by the Food and Drug Administration. References: Voelkerding K (1996).  Clin Lab Med [phone]. Vincenza Hews, PhD, [facility] Ernestene Mention, PhD, [facility] Wyatt Portela, M.S., PhD, [facility] Manya Silvas, PhD, [facility] Bonnielee Haff, PhD, [facility] Alpha Gula PhD, [employer] Performed At: [facility] [address] [place], [place] [id number] Oley Balm MD [id number]                          7   . CRP 03/03/2018 <0.8  <1.0 mg/dL Final   Comment: REPEATED TO VERIFY Performed at [facility], [address], [place], [place] [address]   . Rhuematoid fact SerPl-aCnc 03/03/2018 <10.0  0.0 - 13.9 IU/mL Final   Comment: (NOTE) Performed At: [facility] [address] [place], [place] [id number] Jolene Schimke MD GZ:[id number]   . Ferritin 03/03/2018 113  24 - 336 ng/mL Final    Performed at [facility], [address], [place], [place] [address]  . Total Protein ELP 03/03/2018 7.4  6.0 - 8.5 g/dL Final  . Albumin ELP 28/11/8865 3.6  2.9 - 4.4 g/dL Final  . RJPVG-6-KDPTELMR 03/03/2018 0.3  0.0 - 0.4 g/dL Final  . AJHHI-3-UPBDHDIX 03/03/2018 0.9  0.4 - 1.0 g/dL Final  . Beta Globulin 03/03/2018 1.2  0.7 - 1.3 g/dL Final  . Gamma Globulin 03/03/2018 1.4  0.4 - 1.8 g/dL Final  . M-Spike, % 78/47/8412 Not Observed  Not Observed g/dL Final  . SPE Interp. 82/08/1386 Comment   Final   Comment: (NOTE) The SPE pattern appears essentially unremarkable. Evidence of monoclonal protein is not apparent. Performed At: [facility] [address] [place], [place] [id number] Jolene Schimke MD EZ:[id number]   . Comment 03/03/2018 Comment   Final   Comment: (NOTE) Protein electrophoresis scan will follow via computer, mail, or courier delivery.   . Globulin, Total 03/03/2018 3.8  2.2 - 3.9 g/dL Corrected  . A/G Ratio 03/03/2018 0.9  0.7 - 1.7 Corrected  . LDH 03/03/2018 186  98 - 192 U/L Final   Performed at [facility], [address] [place], [place] [address]  . Sodium 03/03/2018 138  135 - 145 mmol/L Final  . Potassium 03/03/2018 3.6  3.5 - 5.1 mmol/L Final  . Chloride 03/03/2018 100  98 - 111 mmol/L Final  . CO2 03/03/2018 31  22 - 32 mmol/L Final  . Glucose, Bld 03/03/2018 92  70 - 99 mg/dL  Final  . BUN 03/03/2018 8  6 - 20 mg/dL Final  . Creatinine, Ser 03/03/2018 0.95  0.61 - 1.24 mg/dL Final  . Calcium 16/10/9602 9.6  8.9 - 10.3 mg/dL Final  . Total Protein 03/03/2018 7.8  6.5 - 8.1 g/dL Final  . Albumin 54/09/8117 4.0  3.5 - 5.0 g/dL Final  . AST 14/78/2956 15  15 - 41 U/L Final  . ALT 03/03/2018 17  0 - 44 U/L Final  . Alkaline Phosphatase 03/03/2018 61  38 - 126 U/L Final  . Total Bilirubin 03/03/2018 0.4  0.3 - 1.2 mg/dL Final  . GFR calc non Af Amer 03/03/2018 >60  >60 mL/min Final  . GFR calc Af  Amer 03/03/2018 >60  >60 mL/min Final  . Anion gap 03/03/2018 7  5 - 15 Final   Performed at [facility], [address], [place], [place] [address]  . WBC 03/03/2018 6.8  4.0 - 10.5 K/uL Final  . RBC 03/03/2018 5.11  4.22 - 5.81 MIL/uL Final  . Hemoglobin 03/03/2018 14.5  13.0 - 17.0 g/dL Final  . HCT 65/78/4696 44.7  39.0 - 52.0 % Final  . MCV 03/03/2018 87.5  80.0 - 100.0 fL Final  . MCH 03/03/2018 28.4  26.0 - 34.0 pg Final  . MCHC 03/03/2018 32.4  30.0 - 36.0 g/dL Final  . RDW 29/52/8413 15.0  11.5 - 15.5 % Final  . Platelets 03/03/2018 323  150 - 400 K/uL Final  . nRBC 03/03/2018 0.0  0.0 - 0.2 % Final  . Neutrophils Relative % 03/03/2018 65  % Final  . Neutro Abs 03/03/2018 4.4  1.7 - 7.7 K/uL Final  . Lymphocytes Relative 03/03/2018 25  % Final  . Lymphs Abs 03/03/2018 1.7  0.7 - 4.0 K/uL Final  . Monocytes Relative 03/03/2018 9  % Final  . Monocytes Absolute 03/03/2018 0.6  0.1 - 1.0 K/uL Final  . Eosinophils Relative 03/03/2018 1  % Final  . Eosinophils Absolute 03/03/2018 0.1  0.0 - 0.5 K/uL Final  . Basophils Relative 03/03/2018 0  % Final  . Basophils Absolute 03/03/2018 0.0  0.0 - 0.1 K/uL Final  . Immature Granulocytes 03/03/2018 0  % Final  . Abs Immature Granulocytes 03/03/2018 0.01  0.00 - 0.07 K/uL Final   Performed at [facility], [address] [place], [place] [address]  . dRVVT Mix 03/03/2018 80.1* 0.0 - 47.0 sec Final   Comment: (NOTE) Performed At: [facility] [address] [place], [place] [id number] Jolene Schimke MD QI:[id number]   . dRVVT Confirm 03/03/2018 2.0* 0.8 - 1.2 ratio Final   Comment: (NOTE) Performed At: [facility] [address] [place], [place] [id number] Jolene Schimke MD JJ:[id number]      Pathology Orders Placed This Encounter  Procedures  . CBC with Differential (Cancer Center Only)    Standing Status:   Future    Standing Expiration Date:   03/25/2019  .  CMP (Cancer Center only)    Standing Status:   Future    Standing Expiration Date:   03/25/2019  . Lactate dehydrogenase (LDH)    Standing Status:   Future    Standing Expiration Date:   03/25/2019  . Lupus anticoagulant panel    Standing Status:   Future    Standing Expiration Date:   03/25/2019       Ahmed Prima MD

## 2018-03-29 ENCOUNTER — Telehealth: Payer: Self-pay | Admitting: *Deleted

## 2018-03-29 NOTE — Telephone Encounter (Signed)
Pt called stating that he has CT scan scheduled for Wed 03/30/18.  However, since he has [employer] that will only cover procedures done at [facility], his CT scan here at [facility] will not be covered due to out of network.  Pt wanted to know what Dr. Melton Alar would recommend.   Informed pt that message will be relayed to Dr. Melton Alar, and the nurse will contact pt on Thursday for further instructions. Pt voiced understanding. Pt's    Phone     [phone].

## 2018-03-30 ENCOUNTER — Ambulatory Visit (HOSPITAL_COMMUNITY): Payer: BLUE CROSS/BLUE SHIELD

## 2018-04-05 ENCOUNTER — Telehealth: Payer: Self-pay

## 2018-04-05 ENCOUNTER — Telehealth: Payer: Self-pay | Admitting: Internal Medicine

## 2018-04-05 ENCOUNTER — Other Ambulatory Visit: Payer: Self-pay | Admitting: Internal Medicine

## 2018-04-05 DIAGNOSIS — R0602 Shortness of breath: Secondary | ICD-10-CM

## 2018-04-05 DIAGNOSIS — R634 Abnormal weight loss: Secondary | ICD-10-CM

## 2018-04-05 NOTE — Telephone Encounter (Signed)
[facility].

## 2018-04-05 NOTE — Telephone Encounter (Signed)
Returned TC to pt in regard to scheduling CT scan. Pt stated that Dr. Melton Alar wanted him to have CT scan done but he had to cancel because his insurance wouldn't cover scans at [facility]. He wanted to know if he could have scans at [facility] because his insurance will cover CT there. Spoke with Dr Melton Alar who stated that she would speak to scheduling about how to go about ordering outside scans and get back to him. Pt verbalized understanding. No further problems or concerns at this time.

## 2018-04-08 ENCOUNTER — Other Ambulatory Visit: Payer: Self-pay | Admitting: Family Medicine

## 2018-04-13 ENCOUNTER — Telehealth: Payer: Self-pay | Admitting: Emergency Medicine

## 2018-04-13 NOTE — Telephone Encounter (Signed)
Returning pt's call requesting that his upcoming scans be done under [facility] since that is what his insurance covers.  Spoke with Darlena, order and prior authorization correct and being sent to [facility].  Called pt to give him contact information to schedule his scan.  Pt VU to call back as needed.  MD Higgs aware.

## 2018-04-19 ENCOUNTER — Telehealth: Payer: Self-pay

## 2018-04-19 NOTE — Telephone Encounter (Signed)
Received a voicemail from patient requesting information about CT scans. Returned call to patient and phone went straight to voicemail. Left message for patient to return call to [phone].

## 2018-05-02 ENCOUNTER — Ambulatory Visit (HOSPITAL_COMMUNITY): Payer: BLUE CROSS/BLUE SHIELD

## 2018-05-28 ENCOUNTER — Other Ambulatory Visit: Payer: Self-pay | Admitting: Family Medicine

## 2018-07-07 ENCOUNTER — Encounter: Payer: Self-pay | Admitting: Family Medicine

## 2018-07-07 ENCOUNTER — Ambulatory Visit: Payer: Self-pay | Admitting: Family Medicine

## 2018-07-07 ENCOUNTER — Telehealth: Payer: BLUE CROSS/BLUE SHIELD | Admitting: Family Medicine

## 2018-07-07 ENCOUNTER — Other Ambulatory Visit: Payer: Self-pay

## 2018-07-07 VITALS — BP 162/83 | Ht 71.0 in | Wt 335.0 lb

## 2018-07-07 DIAGNOSIS — Z13220 Encounter for screening for lipoid disorders: Secondary | ICD-10-CM

## 2018-07-07 DIAGNOSIS — R7302 Impaired glucose tolerance (oral): Secondary | ICD-10-CM

## 2018-07-07 DIAGNOSIS — I10 Essential (primary) hypertension: Secondary | ICD-10-CM

## 2018-07-07 MED ORDER — AMLODIPINE BESYLATE 10 MG PO TABS: 10.0000 mg | ORAL_TABLET | Freq: Every day | ORAL | 4 refills | Status: DC

## 2018-07-07 NOTE — Progress Notes (Unsigned)
Virtual Visit via Telephone Note  I connected with Danny Gomez on 07/07/18 at  8:00 AM EDT by telephone and verified that I am speaking with the correct person using two identifiers.  Location: Patient: home  Provider: office   I discussed the limitations, risks, security and privacy concerns of performing an evaluation and management service by telephone and the availability of in person appointments. I also discussed with the patient that there may be a patient responsible charge related to this service. The patient expressed understanding and agreed to proceed.   History of Present Illness:    Observations/Objective:   Assessment and Plan:   Follow Up Instructions:    I discussed the assessment and treatment plan with the patient. The patient was provided an opportunity to ask questions and all were answered. The patient agreed with the plan and demonstrated an understanding of the instructions.   The patient was advised to call back or seek an in-person evaluation if the symptoms worsen or if the condition fails to improve as anticipated.  I provided 25 minutes of non-face-to-face time during this encounter.   Tula Nakayama, MD

## 2018-07-07 NOTE — Patient Instructions (Addendum)
F/u end August, call if you need me before  BP remains too high, need this reduced to protect your heart, kidneys and brain  Increase amlodipine dose to 10 mg  Daily, you may take TWO 5 mg tabs together till done  Fasting lipid, chem 7 and EGFR, hBA1C and tSH 1 week before follow up, [facility]   You need to contact heme/ onc to orgamnize to get scans ordered completed before your July visit with Dr Walden Field  Work on 10 pound weight loss, you CAN DO THIS!  It is important that you exercise regularly at least 30 minutes 5 times a week. If you develop chest pain, have severe difficulty breathing, or feel very tired, stop exercising immediately and seek medical attention   Think about what you will eat, plan ahead. Choose " clean, green, fresh or frozen" over canned, processed or packaged foods which are more sugary, salty and fatty. 70 to 75% of food eaten should be vegetables and fruit. Three meals at set times with snacks allowed between meals, but they must be fruit or vegetables. Aim to eat over a 12 hour period , example 7 am to 7 pm, and STOP after  your last meal of the day. Drink water,generally about 64 ounces per day, no other drink is as healthy. Fruit juice is best enjoyed in a healthy way, by EATING the fruit.

## 2018-07-26 ENCOUNTER — Ambulatory Visit: Payer: Self-pay | Admitting: Family Medicine

## 2018-07-30 ENCOUNTER — Other Ambulatory Visit: Payer: Self-pay | Admitting: Family Medicine

## 2018-08-01 ENCOUNTER — Ambulatory Visit (HOSPITAL_COMMUNITY): Payer: BLUE CROSS/BLUE SHIELD

## 2018-08-04 ENCOUNTER — Inpatient Hospital Stay: Payer: BLUE CROSS/BLUE SHIELD

## 2018-08-04 ENCOUNTER — Inpatient Hospital Stay: Payer: BLUE CROSS/BLUE SHIELD | Admitting: Internal Medicine

## 2018-08-04 ENCOUNTER — Other Ambulatory Visit: Payer: Self-pay | Admitting: Internal Medicine

## 2018-08-04 DIAGNOSIS — I82492 Acute embolism and thrombosis of other specified deep vein of left lower extremity: Secondary | ICD-10-CM

## 2018-08-05 ENCOUNTER — Telehealth: Payer: Self-pay | Admitting: *Deleted

## 2018-08-05 NOTE — Telephone Encounter (Signed)
[facility] and explained that patient needs a doppler, but [facility] is not in network for patient. They will get him registered. Faxed order to [facility] office for a Vascular US. They will contact patient with an appt.  Pt notified.

## 2018-08-24 ENCOUNTER — Telehealth: Payer: Self-pay | Admitting: Oncology

## 2018-08-24 NOTE — Telephone Encounter (Signed)
[facility] transfer to [facility]. Left message for patient to set up transfer to [facility]. Not able to reach patient or leave message - voice mail full.

## 2018-08-26 ENCOUNTER — Telehealth: Payer: Self-pay | Admitting: Oncology

## 2018-08-26 NOTE — Telephone Encounter (Signed)
[facility] transfer to [place]. Confirmed with patient lab/fu 8/17.

## 2018-09-05 ENCOUNTER — Encounter: Payer: Self-pay | Admitting: Adult Health

## 2018-09-05 ENCOUNTER — Inpatient Hospital Stay: Payer: BLUE CROSS/BLUE SHIELD

## 2018-09-05 ENCOUNTER — Telehealth: Payer: Self-pay | Admitting: *Deleted

## 2018-09-05 ENCOUNTER — Ambulatory Visit: Payer: BLUE CROSS/BLUE SHIELD | Admitting: Family Medicine

## 2018-09-05 ENCOUNTER — Telehealth: Payer: Self-pay

## 2018-09-05 ENCOUNTER — Other Ambulatory Visit: Payer: Self-pay

## 2018-09-05 ENCOUNTER — Inpatient Hospital Stay: Payer: BLUE CROSS/BLUE SHIELD | Attending: Adult Health | Admitting: Adult Health

## 2018-09-05 VITALS — BP 139/66 | HR 83 | Temp 98.3°F | Resp 18 | Ht 71.0 in | Wt 332.8 lb

## 2018-09-05 DIAGNOSIS — I82492 Acute embolism and thrombosis of other specified deep vein of left lower extremity: Secondary | ICD-10-CM

## 2018-09-05 DIAGNOSIS — M109 Gout, unspecified: Secondary | ICD-10-CM | POA: Insufficient documentation

## 2018-09-05 DIAGNOSIS — F1290 Cannabis use, unspecified, uncomplicated: Secondary | ICD-10-CM | POA: Diagnosis not present

## 2018-09-05 DIAGNOSIS — Z79899 Other long term (current) drug therapy: Secondary | ICD-10-CM | POA: Insufficient documentation

## 2018-09-05 DIAGNOSIS — Z86718 Personal history of other venous thrombosis and embolism: Secondary | ICD-10-CM | POA: Diagnosis present

## 2018-09-05 DIAGNOSIS — D6862 Lupus anticoagulant syndrome: Secondary | ICD-10-CM | POA: Diagnosis not present

## 2018-09-05 DIAGNOSIS — I10 Essential (primary) hypertension: Secondary | ICD-10-CM | POA: Insufficient documentation

## 2018-09-05 DIAGNOSIS — J45909 Unspecified asthma, uncomplicated: Secondary | ICD-10-CM | POA: Insufficient documentation

## 2018-09-05 DIAGNOSIS — Z7901 Long term (current) use of anticoagulants: Secondary | ICD-10-CM | POA: Diagnosis not present

## 2018-09-05 LAB — CBC WITH DIFFERENTIAL (CANCER CENTER ONLY)
Abs Immature Granulocytes: 0.02 10*3/uL (ref 0.00–0.07)
Basophils Absolute: 0 10*3/uL (ref 0.0–0.1)
Basophils Relative: 0 %
Eosinophils Absolute: 0.2 10*3/uL (ref 0.0–0.5)
Eosinophils Relative: 2 %
HCT: 47.2 % (ref 39.0–52.0)
Hemoglobin: 15.4 g/dL (ref 13.0–17.0)
Immature Granulocytes: 0 %
Lymphocytes Relative: 21 %
Lymphs Abs: 2.1 10*3/uL (ref 0.7–4.0)
MCH: 28.7 pg (ref 26.0–34.0)
MCHC: 32.6 g/dL (ref 30.0–36.0)
MCV: 87.9 fL (ref 80.0–100.0)
Monocytes Absolute: 0.9 10*3/uL (ref 0.1–1.0)
Monocytes Relative: 10 %
Neutro Abs: 6.6 10*3/uL (ref 1.7–7.7)
Neutrophils Relative %: 67 %
Platelet Count: 348 10*3/uL (ref 150–400)
RBC: 5.37 MIL/uL (ref 4.22–5.81)
RDW: 14.6 % (ref 11.5–15.5)
WBC Count: 9.9 10*3/uL (ref 4.0–10.5)
nRBC: 0 % (ref 0.0–0.2)

## 2018-09-05 LAB — CMP (CANCER CENTER ONLY)
ALT: 18 U/L (ref 0–44)
AST: 14 U/L — ABNORMAL LOW (ref 15–41)
Albumin: 3.8 g/dL (ref 3.5–5.0)
Alkaline Phosphatase: 55 U/L (ref 38–126)
Anion gap: 7 (ref 5–15)
BUN: 14 mg/dL (ref 6–20)
CO2: 28 mmol/L (ref 22–32)
Calcium: 9.5 mg/dL (ref 8.9–10.3)
Chloride: 104 mmol/L (ref 98–111)
Creatinine: 1.01 mg/dL (ref 0.61–1.24)
GFR, Est AFR Am: >60 mL/min (ref >60)
GFR, Estimated: >60 mL/min (ref >60)
Glucose, Bld: 80 mg/dL (ref 70–99)
Potassium: 4.1 mmol/L (ref 3.5–5.1)
Sodium: 139 mmol/L (ref 135–145)
Total Bilirubin: 0.4 mg/dL (ref 0.3–1.2)
Total Protein: 7.4 g/dL (ref 6.5–8.1)

## 2018-09-05 LAB — LACTATE DEHYDROGENASE: LDH: 161 U/L (ref 98–192)

## 2018-09-05 LAB — CEA (IN HOUSE-CHCC): CEA (CHCC-In House): <1 ng/mL (ref 0.00–5.00)

## 2018-09-05 MED ORDER — RIVAROXABAN 10 MG PO TABS: 10.0000 mg | ORAL_TABLET | Freq: Every day | ORAL | 5 refills | Status: DC

## 2018-09-05 NOTE — Telephone Encounter (Signed)
Per Mendel Ryder request sent to medical records at [facility] for doppler results to be faxed over. Faxed request sent to [phone]

## 2018-09-05 NOTE — Progress Notes (Addendum)
[facility]  [phone] [phone]     ID: Danny Gomez DOB: 30-Dec-1985  MR#: [id number]  KGS#:[id number]  Patient Care Team: Fayrene Helper, MD as PCP - Ferdinand, NP OTHER MD:  CHIEF COMPLAINT: Left Leg DVT  CURRENT TREATMENT: Rivaroxaban to restart   HISTORY OF CURRENT ILLNESS: Danny Gomez first went to the ER on 01/12/2018 for an erythematous left great toe.  He was diagnosed with gout and treated.  One week later, on 01/19/2018 he had developed left lower extremity swelling and left calf soreness.  He returned to the ER and doppler was positive for acute DVT in the left posterior tibial veing, and left peroneal vein.  He was started on Rivaroxaban, and was referred to hematology for evaluation.  He was evaluated by Dr. Walden Field and continued on Rivaroxaban.    The patient's subsequent history is as detailed below.  INTERVAL HISTORY: Danny Gomez notes he has been doing well since his last visit.  He says that he stopped the Rivaroxaban in July, as Dr. Walden Field had instructed him to do.  He denies any issues with taking the medication, other than feeling somewhat chilly.    Since his last visit at [facility], he underwent a repeat doppler of his left leg that was negative for DVT.  We are awaiting fax from [facility] for these results.    REVIEW OF SYSTEMS: Danny Gomez denies any further swelling in his left leg.  He is without any fever or chills.  He has no easy bruising/bleeding, fatigue, night sweats, unintentional weight loss, nausea, vomiting, bowel/bladder changes, chest pain, palpitations, cough, or shortness of breath.  A detailed ROS was otherwise non contributory.    PAST MEDICAL HISTORY: Past Medical History:  Diagnosis Date  . Asthma    pt. reports as a child he used an inhaler , but asthma has resoled in the adult path  . Febrile illness   . Headache(784.0)   . Hypertension   . Neuromuscular disorder ([place])    hnp- L4-5, L5-S1  .  Obesity     PAST SURGICAL HISTORY: Past Surgical History:  Procedure Laterality Date  . right ankle surgery  2002   bones had fused but it was corrected surgically  . TONSILLECTOMY      FAMILY HISTORY Family History  Problem Relation Age of Onset  . Hypertension Mother   . Obesity Mother      SOCIAL HISTORY: Danny Gomez is single and lives alone in [place], [place].  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  He exercises 5 days a week by doing tae bo on you tube.  He drinks ETOH socially, and uses marijuana occasionally.  He denies any tobacco use or other illicit drug use.      ADVANCED DIRECTIVES: not in place   HEALTH MAINTENANCE: Social History   Tobacco Use  . Smoking status: Never Smoker  . Smokeless tobacco: Never Used  Substance Use Topics  . Alcohol use: No    Comment: socially- monthly   . Drug use: Yes    Types: Marijuana    Comment: once per day         No Known Allergies  Current Outpatient Medications  Medication Sig Dispense Refill  . acetaminophen (TYLENOL 8 HOUR ARTHRITIS PAIN) 650 MG CR tablet Take 1,300 mg by mouth every 8 (eight) hours as needed for pain.    [name] Kitchen allopurinol (ZYLOPRIM) 100 MG  tablet Take 1 tablet (100 mg total) by mouth daily. 30 tablet 6  . amLODipine (NORVASC) 10 MG tablet Take 1 tablet (10 mg total) by mouth daily. 30 tablet 4  . lisinopril-hydrochlorothiazide (ZESTORETIC) 20-25 MG tablet TAKE 1 TABLET BY MOUTH ONCE DAILY **NEEDS FOLLOW UP APPOINTMENT** 90 tablet 1  . rivaroxaban (XARELTO) 10 MG TABS tablet Take 1 tablet (10 mg total) by mouth daily. 30 tablet 5   No current facility-administered medications for this visit.     OBJECTIVE:  Vitals:   09/05/18 1343  BP: 139/66  Pulse: 83  Resp: 18  Temp: 98.3 F (36.8 C)  SpO2: 100%     Body mass index is 46.42 kg/m.   Wt Readings from Last 3 Encounters:  09/05/18 (!) 332 lb 12.8 oz (151 kg)  07/07/18 (!) 335 lb (152 kg)   03/25/18 (!) 341 lb (154.7 kg)  ECOG FS:0 - Asymptomatic GENERAL: Patient is a well appearing morbidly obese male in no acute distress HEENT:  Sclerae anicteric.  Oropharynx clear and moist. No ulcerations or evidence of oropharyngeal candidiasis. Neck is supple.  NODES:  No cervical, supraclavicular, or axillary lymphadenopathy palpated.  LUNGS:  Clear to auscultation bilaterally.  No wheezes or rhonchi. HEART:  Regular rate and rhythm. No murmur appreciated. ABDOMEN:  Soft, nontender.  Positive, normoactive bowel sounds. No organomegaly palpated. MSK:  No focal spinal tenderness to palpation.  EXTREMITIES:  No peripheral edema.   SKIN:  Clear with no obvious rashes or skin changes. No nail dyscrasia. NEURO:  Nonfocal. Well oriented.  Appropriate affect.     LAB RESULTS:  CMP     Component Value Date/Time   NA 139 09/05/2018 1321   K 4.1 09/05/2018 1321   CL 104 09/05/2018 1321   CO2 28 09/05/2018 1321   GLUCOSE 80 09/05/2018 1321   BUN 14 09/05/2018 1321   CREATININE 1.01 09/05/2018 1321   CREATININE 0.83 01/31/2016 0942   CALCIUM 9.5 09/05/2018 1321   PROT 7.4 09/05/2018 1321   ALBUMIN 3.8 09/05/2018 1321   AST 14 (L) 09/05/2018 1321   ALT 18 09/05/2018 1321   ALKPHOS 55 09/05/2018 1321   BILITOT 0.4 09/05/2018 1321   GFRNONAA >60 09/05/2018 1321   GFRAA >60 09/05/2018 1321    Lab Results  Component Value Date   TOTALPROTELP 7.4 03/03/2018   ALBUMINELP 3.6 03/03/2018   A1GS 0.3 03/03/2018   A2GS 0.9 03/03/2018   BETS 1.2 03/03/2018   GAMS 1.4 03/03/2018   MSPIKE Not Observed 03/03/2018   SPEI Comment 03/03/2018    No results found for: KPAFRELGTCHN, LAMBDASER, [facility]  Lab Results  Component Value Date   WBC 9.9 09/05/2018   NEUTROABS 6.6 09/05/2018   HGB 15.4 09/05/2018   HCT 47.2 09/05/2018   MCV 87.9 09/05/2018   PLT 348 09/05/2018      Chemistry      Component Value Date/Time   NA 139 09/05/2018 1321   K 4.1 09/05/2018 1321   CL 104  09/05/2018 1321   CO2 28 09/05/2018 1321   BUN 14 09/05/2018 1321   CREATININE 1.01 09/05/2018 1321   CREATININE 0.83 01/31/2016 0942      Component Value Date/Time   CALCIUM 9.5 09/05/2018 1321   ALKPHOS 55 09/05/2018 1321   AST 14 (L) 09/05/2018 1321   ALT 18 09/05/2018 1321   BILITOT 0.4 09/05/2018 1321       No results found for: LABCA2  No components found for: QZRAQT622  No  results for input(s): INR in the last 168 hours.  No results found for: LABCA2  No results found for: CAN199  No results found for: WJX914  No results found for: NWG956  No results found for: CA2729  No components found for: HGQUANT  Lab Results  Component Value Date   CEA1 <1.00 09/05/2018   /  CEA ([place])  Date Value Ref Range Status  09/05/2018 <1.00 0.00 - 5.00 ng/mL Final    Comment:    (NOTE) This test was performed using Architect's Chemiluminescent Microparticle Immunoassay. Values obtained from different assay methods cannot be used interchangeably. Please note that 5-10% of patients who smoke may see CEA levels up to 6.9 ng/mL. Performed at [facility], [place] [address], [place], [place] [address]      No results found for: AFPTUMOR  No results found for: [place]  No results found for: PSA1  Appointment on 09/05/2018  Component Date Value Ref Range Status  . PTT Lupus Anticoagulant 09/05/2018 42.1  0.0 - 51.9 sec Final  . DRVVT 09/05/2018 44.3  0.0 - 47.0 sec Final  . Lupus Anticoag Interp 09/05/2018 Comment:   Corrected   Comment: (NOTE) No lupus anticoagulant was detected. Performed At: [facility] [place], [place] [id number] Rush Farmer MD XB:[id number]   . LDH 09/05/2018 161  98 - 192 U/L Final   Performed at [facility], [place] [address] [place], [place] [address]  . CEA ([facility]) 09/05/2018 <1.00  0.00 - 5.00 ng/mL Final   Comment: (NOTE) This test was performed  using Architect's Chemiluminescent Microparticle Immunoassay. Values obtained from different assay methods cannot be used interchangeably. Please note that 5-10% of patients who smoke may see CEA levels up to 6.9 ng/mL. Performed at [facility], [place] [address], [place], [place] [address]   . Sodium 09/05/2018 139  135 - 145 mmol/L Final  . Potassium 09/05/2018 4.1  3.5 - 5.1 mmol/L Final  . Chloride 09/05/2018 104  98 - 111 mmol/L Final  . CO2 09/05/2018 28  22 - 32 mmol/L Final  . Glucose, Bld 09/05/2018 80  70 - 99 mg/dL Final  . BUN 09/05/2018 14  6 - 20 mg/dL Final  . Creatinine 09/05/2018 1.01  0.61 - 1.24 mg/dL Final  . Calcium 09/05/2018 9.5  8.9 - 10.3 mg/dL Final  . Total Protein 09/05/2018 7.4  6.5 - 8.1 g/dL Final  . Albumin 09/05/2018 3.8  3.5 - 5.0 g/dL Final  . AST 09/05/2018 14* 15 - 41 U/L Final  . ALT 09/05/2018 18  0 - 44 U/L Final  . Alkaline Phosphatase 09/05/2018 55  38 - 126 U/L Final  . Total Bilirubin 09/05/2018 0.4  0.3 - 1.2 mg/dL Final  . GFR, Est Non Af Am 09/05/2018 >60  >60 mL/min Final  . GFR, Est AFR Am 09/05/2018 >60  >60 mL/min Final  . Anion gap 09/05/2018 7  5 - 15 Final   Performed at [facility], [place] [address], [place], [place] [address]  . WBC Count 09/05/2018 9.9  4.0 - 10.5 K/uL Final  . RBC 09/05/2018 5.37  4.22 - 5.81 MIL/uL Final  . Hemoglobin 09/05/2018 15.4  13.0 - 17.0 g/dL Final  . HCT 09/05/2018 47.2  39.0 - 52.0 % Final  . MCV 09/05/2018 87.9  80.0 - 100.0 fL Final  . MCH 09/05/2018 28.7  26.0 - 34.0 pg Final  .  MCHC 09/05/2018 32.6  30.0 - 36.0 g/dL Final  . RDW 09/05/2018 14.6  11.5 - 15.5 % Final  . Platelet Count 09/05/2018 348  150 - 400 K/uL Final  . nRBC 09/05/2018 0.0  0.0 - 0.2 % Final  . Neutrophils Relative % 09/05/2018 67  % Final  . Neutro Abs 09/05/2018 6.6  1.7 - 7.7 K/uL Final  . Lymphocytes Relative 09/05/2018 21  % Final  . Lymphs Abs 09/05/2018 2.1  0.7 - 4.0  K/uL Final  . Monocytes Relative 09/05/2018 10  % Final  . Monocytes Absolute 09/05/2018 0.9  0.1 - 1.0 K/uL Final  . Eosinophils Relative 09/05/2018 2  % Final  . Eosinophils Absolute 09/05/2018 0.2  0.0 - 0.5 K/uL Final  . Basophils Relative 09/05/2018 0  % Final  . Basophils Absolute 09/05/2018 0.0  0.0 - 0.1 K/uL Final  . Immature Granulocytes 09/05/2018 0  % Final  . Abs Immature Granulocytes 09/05/2018 0.02  0.00 - 0.07 K/uL Final   Performed at [facility], [place] [address], [place], [place] [address]    (this displays the last labs from the last 3 days)  Lab Results  Component Value Date   TOTALPROTELP 7.4 03/03/2018   ALBUMINELP 3.6 03/03/2018   A1GS 0.3 03/03/2018   A2GS 0.9 03/03/2018   BETS 1.2 03/03/2018   GAMS 1.4 03/03/2018   MSPIKE Not Observed 03/03/2018   SPEI Comment 03/03/2018   (this displays SPEP labs)  No results found for: KPAFRELGTCHN, LAMBDASER, KAPLAMBRATIO (kappa/lambda light chains)  No results found for: HGBA, HGBA2QUANT, HGBFQUANT, HGBSQUAN (Hemoglobinopathy evaluation)   Lab Results  Component Value Date   LDH 161 09/05/2018    No results found for: IRON, TIBC, IRONPCTSAT (Iron and TIBC)  Lab Results  Component Value Date   FERRITIN 113 03/03/2018    Urinalysis    Component Value Date/Time   BILIRUBINUR negative 05/11/2013 1657   PROTEINUR negative 05/11/2013 1657   UROBILINOGEN 0.2 05/11/2013 1657   NITRITE negative 05/11/2013 1657   LEUKOCYTESUR Negative 05/11/2013 1657     STUDIES: No results found.   ASSESSMENT: 33 y.o.  man with incident LLE DVT diagnosed by Doppler ultrasonography 01/19/2018 and of the left posterior tibial and left peroneal veins in the setting of an acute gout flare  (a) risk factors include inflammation, immobility, morbid obesity  (b) hypercoagulable screen negative except for a positive lupus anticoagulant February 2020  (c) repeat lupus anticoagulant test  August 2020 now negative  1. Acute DVT noted in left lower extremity 01/19/2018  (a) Started on Rivaroxaban in 01/2018  (b) Repeat doppler on 08/16/18 negative for DVT--rivaroxaban stopped   (c) Rivaroxaban resumed at 1m daily 09/05/2018  2.  Morbid obesity: Diet and exercise discussed with the patient  PLAN:  PDestinymet with myself and Dr. MJana Hakimtoday.  He reviewed his lab work, and his medical history.  Since PRaetolerated the Rivaroxaban well when he was on it, and considering his morbid obesity, he was recommended to restart Rivaroxaban on 171mdaily life long.  Dr. MaJana Hakimeviewed that if the patient could get his weight under 200 pounds, then they could discuss stopping the rivaroxaban.  Patient is in agreement with this.    We will see him back in 11/2018 for labs and f/u.  he was recommended to continue with the appropriate pandemic precautions. he knows to call for any questions that may arise between now and his next appointment.  We are  happy to see him sooner if needed.   Wilber Bihari, NP   09/07/2018 8:09 AM Medical Oncology and Hematology [place], [place] [address] Tel. [phone]    Fax. [phone]   ADDENDUM: I would describe [place] incident DVT as provoked in the sense that it occurred in the setting of inflammation from gout and at least partial immobility due to the pain he was experiencing at this time.  Presumably those risk factors are now resolved.  However he is morbidly obese, and he had a transient lupus anticoagulant.  A 33 year old man with even a provoked DVT is at risk of further DVTs in the future.  With continuing morbid obesity I would think a risk of DVT greater than 20% in the next 3 years and possibly as high as 50% in the next 5 years is reasonable.  For that reason I think Arnette Norris needs lifelong anticoagulation.  We now have data that after the first 6 months we can switch to low-dose  rivaroxaban at 10 mg, with much lower risk of bleeding complications.  He is able to obtain this drug at a reasonable cost and has had no side effects from it that he is aware of.  He is willing to resume rivaroxaban at this lower dose and we have gone ahead and placed the appropriate order.  If he is able to resolve the obesity issue and enters a regular exercise program possibly at some point in the future we may be able to discontinue anticoagulation.  Otherwise he will return to see me sometime in November and we will review this decision again at that time  He knows to call for any other issue that may develop before then.   I personally saw this patient and performed a substantive portion of this encounter with the listed APP documented above.   Chauncey Cruel, MD Medical Oncology and Hematology [facility] [address] [place], [place] [address] Tel. [phone]    Fax. [phone]

## 2018-09-05 NOTE — Telephone Encounter (Signed)
noted 

## 2018-09-05 NOTE — Telephone Encounter (Signed)
Pt called back and said he just wanted to let Dr. Moshe Cipro his bp was checked today it was 139/66 and he would keep in office appt for next monday

## 2018-09-06 ENCOUNTER — Telehealth: Payer: Self-pay | Admitting: Oncology

## 2018-09-06 LAB — LUPUS ANTICOAGULANT PANEL
DRVVT: 44.3 s (ref 0.0–47.0)
PTT Lupus Anticoagulant: 42.1 s (ref 0.0–51.9)

## 2018-09-06 NOTE — Telephone Encounter (Signed)
I talk with patient regarding schedule  

## 2018-09-08 ENCOUNTER — Telehealth: Payer: Self-pay

## 2018-09-08 NOTE — Telephone Encounter (Signed)
Per Mendel Ryder call placed  to Pt. To inform him that Lupus coagulant test results were normal.  Pt. Verbalized understanding. No further problems or concerns noted.

## 2018-09-08 NOTE — Telephone Encounter (Signed)
-----   Message from Gardenia Phlegm, NP sent at 09/06/2018  4:25 PM EDT ----- Please call patient and let him know that his lupus coagulant, that was elevated 6 months ago, is now normal.  This is good news. ----- Message ----- From: Interface, Lab In [place] Sent: 09/05/2018   1:40 PM EDT To: Gardenia Phlegm, NP

## 2018-09-14 ENCOUNTER — Encounter: Payer: Self-pay | Admitting: Family Medicine

## 2018-09-14 ENCOUNTER — Other Ambulatory Visit: Payer: Self-pay

## 2018-09-14 ENCOUNTER — Ambulatory Visit: Payer: BLUE CROSS/BLUE SHIELD | Admitting: Family Medicine

## 2018-09-14 VITALS — BP 130/88 | HR 71 | Temp 97.9°F | Resp 15 | Ht 71.0 in | Wt 333.0 lb

## 2018-09-14 DIAGNOSIS — Z23 Encounter for immunization: Secondary | ICD-10-CM | POA: Diagnosis not present

## 2018-09-14 DIAGNOSIS — R7302 Impaired glucose tolerance (oral): Secondary | ICD-10-CM

## 2018-09-14 DIAGNOSIS — I10 Essential (primary) hypertension: Secondary | ICD-10-CM

## 2018-09-14 DIAGNOSIS — R7301 Impaired fasting glucose: Secondary | ICD-10-CM

## 2018-09-14 DIAGNOSIS — M5431 Sciatica, right side: Secondary | ICD-10-CM

## 2018-09-14 DIAGNOSIS — E559 Vitamin D deficiency, unspecified: Secondary | ICD-10-CM | POA: Diagnosis not present

## 2018-09-14 DIAGNOSIS — I82492 Acute embolism and thrombosis of other specified deep vein of left lower extremity: Secondary | ICD-10-CM

## 2018-09-14 DIAGNOSIS — E790 Hyperuricemia without signs of inflammatory arthritis and tophaceous disease: Secondary | ICD-10-CM

## 2018-09-14 NOTE — Patient Instructions (Addendum)
Annual exam in early March 2021, xcall if you need me sooner  Fl;u vaccine today  Please get fasting lipid, HBA1C, uric acid level, TSH and vit D at [facility] same dayin Nov as when getting lab for Oncology  Keep up good health habits , and all the best with heallthy life journey    Thanks for choosing [facility], we consider it a privelige to serve you.   It is important that you exercise regularly at least 30 minutes 5 times a week. If you develop chest pain, have severe difficulty breathing, or feel very tired, stop exercising immediately and seek medical attention    Think about what you will eat, plan ahead. Choose " clean, green, fresh or frozen" over canned, processed or packaged foods which are more sugary, salty and fatty. 70 to 75% of food eaten should be vegetables and fruit. Three meals at set times with snacks allowed between meals, but they must be fruit or vegetables. Aim to eat over a 12 hour period , example 7 am to 7 pm, and STOP after  your last meal of the day. Drink water,generally about 64 ounces per day, no other drink is as healthy. Fruit juice is best enjoyed in a healthy way, by EATING the fruit.

## 2018-09-17 ENCOUNTER — Encounter: Payer: Self-pay | Admitting: Family Medicine

## 2018-09-17 NOTE — Assessment & Plan Note (Signed)
commited to lifelong anticoagulation by hematology based on extent of initial umprovoked dVT and weight

## 2018-09-17 NOTE — Progress Notes (Signed)
   Danny Gomez     MRN: [id number]      DOB: 1985/11/27   HPI Danny Gomez is here for follow up and re-evaluation of chronic medical conditions, medication management and review of any available recent lab and radiology data.  Preventive health is updated, specifically  Cancer screening and Immunization.   Questions or concerns regarding consultations or procedures which the PT has had in the interim are  addressed. The PT denies any adverse reactions to current medications since the last visit.  There are no new concerns.  There are no specific complaints   ROS Denies recent fever or chills. Denies sinus pressure, nasal congestion, ear pain or sore throat. Denies chest congestion, productive cough or wheezing. Denies chest pains, palpitations and leg swelling Denies abdominal pain, nausea, vomiting,diarrhea or constipation.   Denies dysuria, frequency, hesitancy or incontinence. Denies joint pain, swelling and limitation in mobility. Denies headaches, seizures, numbness, or tingling. Denies depression, anxiety or insomnia. Denies skin break down or rash.   PE  BP 130/88   Pulse 71   Temp 97.9 F (36.6 C) (Temporal)   Resp 15   Ht 5\' 11"  (1.803 m)   Wt (!) 333 lb (151 kg)   SpO2 99%   BMI 46.44 kg/m   Patient alert and oriented and in no cardiopulmonary distress.  HEENT: No facial asymmetry, EOMI,   oropharynx pink and moist.  Neck supple no JVD, no mass.  Chest: Clear to auscultation bilaterally.  CVS: S1, S2 no murmurs, no S3.Regular rate.  ABD: Soft non tender.   Ext: No edema  MS: Adequate ROM spine, shoulders, hips and knees.  Skin: Intact, no ulcerations or rash noted.  Psych: Good eye contact, normal affect. Memory intact not anxious or depressed appearing.  CNS: CN 2-12 intact, power,  normal throughout.no focal deficits noted.   Assessment & Plan  Essential hypertension Controlled, no change in medication DASH diet and commitment to daily  physical activity for a minimum of 30 minutes discussed and encouraged, as a part of hypertension management. The importance of attaining a healthy weight is also discussed.  BP/Weight 09/14/2018 09/05/2018 07/07/2018 03/25/2018 03/03/2018 9/47/6546 5/0/3546  Systolic BP 568 127 517 001 749 449 675  Diastolic BP 88 66 83 83 86 81 88  Wt. (Lbs) 333 332.8 335 341 341.9 - -  BMI 46.44 46.42 46.72 47.56 47.69 - 47.42       Morbid obesity  Patient re-educated about  the importance of commitment to a  minimum of 150 minutes of exercise per week as able.  The importance of healthy food choices with portion control discussed, as well as eating regularly and within a 12 hour window most days. The need to choose "clean , green" food 50 to 75% of the time is discussed, as well as to make water the primary drink and set a goal of 64 ounces water daily.    Weight /BMI 09/14/2018 09/05/2018 07/07/2018  WEIGHT 333 lb 332 lb 12.8 oz 335 lb  HEIGHT 5\' 11"  5\' 11"  5\' 11"   BMI 46.44 kg/m2 46.42 kg/m2 46.72 kg/m2      Back pain with right-sided sciatica No recent flare  Left leg DVT ([facility]) commited to lifelong anticoagulation by hematology based on extent of initial umprovoked dVT and weight

## 2018-09-17 NOTE — Assessment & Plan Note (Signed)
No recent flare.  

## 2018-09-17 NOTE — Assessment & Plan Note (Signed)
  Patient re-educated about  the importance of commitment to a  minimum of 150 minutes of exercise per week as able.  The importance of healthy food choices with portion control discussed, as well as eating regularly and within a 12 hour window most days. The need to choose "clean , green" food 50 to 75% of the time is discussed, as well as to make water the primary drink and set a goal of 64 ounces water daily.    Weight /BMI 09/14/2018 09/05/2018 07/07/2018  WEIGHT 333 lb 332 lb 12.8 oz 335 lb  HEIGHT 5\' 11"  5\' 11"  5\' 11"   BMI 46.44 kg/m2 46.42 kg/m2 46.72 kg/m2

## 2018-09-17 NOTE — Assessment & Plan Note (Signed)
Controlled, no change in medication DASH diet and commitment to daily physical activity for a minimum of 30 minutes discussed and encouraged, as a part of hypertension management. The importance of attaining a healthy weight is also discussed.  BP/Weight 09/14/2018 09/05/2018 07/07/2018 03/25/2018 03/03/2018 8/85/0277 04/19/2876  Systolic BP 676 720 947 096 283 662 947  Diastolic BP 88 66 83 83 86 81 88  Wt. (Lbs) 333 332.8 335 341 341.9 - -  BMI 46.44 46.42 46.72 47.56 47.69 - 47.42

## 2018-10-12 ENCOUNTER — Other Ambulatory Visit: Payer: Self-pay | Admitting: Family Medicine

## 2018-11-12 ENCOUNTER — Other Ambulatory Visit: Payer: Self-pay | Admitting: Family Medicine

## 2018-12-06 ENCOUNTER — Inpatient Hospital Stay: Payer: BLUE CROSS/BLUE SHIELD

## 2018-12-06 ENCOUNTER — Inpatient Hospital Stay: Payer: BLUE CROSS/BLUE SHIELD | Admitting: Oncology

## 2018-12-06 ENCOUNTER — Telehealth: Payer: Self-pay | Admitting: Oncology

## 2018-12-06 NOTE — Telephone Encounter (Signed)
Returned patient's phone call regarding rescheduling 11/17 appointment, per patient's request appointment appointment has moved to 12/03.

## 2018-12-21 ENCOUNTER — Other Ambulatory Visit: Payer: Self-pay

## 2018-12-21 ENCOUNTER — Telehealth: Payer: Self-pay | Admitting: Oncology

## 2018-12-21 DIAGNOSIS — I82492 Acute embolism and thrombosis of other specified deep vein of left lower extremity: Secondary | ICD-10-CM

## 2018-12-21 NOTE — Telephone Encounter (Signed)
Returned patient's phone call regarding rescheduling 12/03 appointment, per patient's request appointment has moved to 01/05.

## 2018-12-22 ENCOUNTER — Inpatient Hospital Stay: Payer: BLUE CROSS/BLUE SHIELD | Admitting: Oncology

## 2018-12-22 ENCOUNTER — Inpatient Hospital Stay: Payer: BLUE CROSS/BLUE SHIELD

## 2018-12-22 ENCOUNTER — Other Ambulatory Visit: Payer: Self-pay | Admitting: Oncology

## 2018-12-29 ENCOUNTER — Other Ambulatory Visit: Payer: Self-pay | Admitting: Family Medicine

## 2019-01-17 ENCOUNTER — Other Ambulatory Visit: Payer: Self-pay | Admitting: Family Medicine

## 2019-01-23 NOTE — Progress Notes (Signed)
Danny Gomez  [phone] [phone]     ID: Danny Gomez DOB: 1985/02/11  MR#: [id number]  FXT#:[id number]  Patient Care Team: Fayrene Helper, MD as PCP - General Klyn Kroening, Virgie Dad, MD as Consulting Physician (Oncology) Chauncey Cruel, MD OTHER MD:  CHIEF COMPLAINT: Left Leg DVT  CURRENT TREATMENT: Rivaroxaban to restart   INTERVAL HISTORY: [name] returns today for follow up of his DVT risk.  At the last visit 09/05/2018 we suggested he restart his rivaroxaban, but at a lower dose, at 10 mg.  However after thinking more about it he never did fill that prescription.  He became concerned that bleeding might be worse for him than clotting.  Also he wanted to have some piercing (nose and ears) as well as more tattoos.  At this point he is comfortable with his decision not to resume anticoagulation  At the last visit we also discussed lifestyle changes that might improve his morbid obesity.  His weight January 12, 2018 was 340 pounds.  His weight today is 347 pounds.  REVIEW OF SYSTEMS: Danny Gomez's 34rd birthday is today!  He is celebrating by "taking it easy".  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  At home it is just him so that is less of an issue in terms of the current virus.  A detailed review of systems today was otherwise stable.   HISTORY OF CURRENT ILLNESS: From the original intake note:  Danny Gomez first went to the ER on 01/12/2018 for an erythematous left great toe.  He was diagnosed with gout and treated.  One week later, on 01/19/2018 he had developed left lower extremity swelling and left calf soreness.  He returned to the ER and doppler was positive for acute DVT in the left posterior tibial veing, and left peroneal vein.  He was started on Rivaroxaban, and was referred to hematology for evaluation.  He was evaluated by Dr.  Walden Field and continued on Rivaroxaban.    The patient's subsequent history is as detailed below.   PAST MEDICAL HISTORY: Past Medical History:  Diagnosis Date  . Asthma    pt. reports as a child he used an inhaler , but asthma has resoled in the adult path  . Febrile illness   . Headache(784.0)   . Hypertension   . Neuromuscular disorder ([place])    hnp- L4-5, L5-S1  . Obesity     PAST SURGICAL HISTORY: Past Surgical History:  Procedure Laterality Date  . right ankle surgery  2002   bones had fused but it was corrected surgically  . TONSILLECTOMY      FAMILY HISTORY Family History  Problem Relation Age of Onset  . Hypertension Mother   . Obesity Mother     SOCIAL HISTORY:  Danny Gomez is single and lives alone in [place], [place].  Most of his family lives in [place].  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  He used to exercise 5 days a week by doing tae bo on you tube.  He denies any tobacco use or other illicit drug use.     ADVANCED DIRECTIVES: not in place   HEALTH MAINTENANCE: Social History   Tobacco Use  . Smoking status: Never Smoker  . Smokeless tobacco: Never Used  Substance Use Topics  .  Alcohol use: No    Comment: socially- monthly   . Drug use: Yes    Types: Marijuana    Comment: once per day         No Known Allergies  Current Outpatient Medications  Medication Sig Dispense Refill  . acetaminophen (TYLENOL 8 HOUR ARTHRITIS PAIN) 650 MG CR tablet Take 1,300 mg by mouth every 8 (eight) hours as needed for pain.    [name] Kitchen allopurinol (ZYLOPRIM) 100 MG tablet Take 1 tablet by mouth once daily 30 tablet 0  . amLODipine (NORVASC) 10 MG tablet TAKE 1 TABLET BY MOUTH ONCE DAILY **DOSE  INCREASE  EFFECTIVE  07-07-18,  DISCONTINUE  AMLODIPINE  5MG   PLEASE** 30 tablet 0  . lisinopril-hydrochlorothiazide (ZESTORETIC) 20-25 MG tablet TAKE 1 TABLET BY MOUTH ONCE DAILY **NEEDS FOLLOW UP APPOINTMENT** 90 tablet 1  .  rivaroxaban (XARELTO) 10 MG TABS tablet Take 1 tablet (10 mg total) by mouth daily. 30 tablet 5   No current facility-administered medications for this visit.    OBJECTIVE: Morbidly obese African-American man in no acute distress  Vitals:   01/24/19 1031  BP: (!) 161/84  Pulse: 79  Resp: 18  Temp: 98.7 F (37.1 C)  SpO2: 98%     Body mass index is 48.49 kg/m.   Wt Readings from Last 3 Encounters:  01/24/19 (!) 347 lb 11.2 oz (157.7 kg)  09/14/18 (!) 333 lb (151 kg)  09/05/18 (!) 332 lb 12.8 oz (151 kg)  ECOG FS:0 - Asymptomatic  Sclerae unicteric, EOMs intact Wearing a mask No cervical or supraclavicular adenopathy Lungs no rales or rhonchi Heart regular rate and rhythm Abd soft, nontender, positive bowel sounds MSK no focal spinal tenderness, no lower extremity lymphedema, wearing a right ankle brace Neuro: nonfocal, well oriented, appropriate affect   LAB RESULTS:  CMP     Component Value Date/Time   NA 139 09/05/2018 1321   K 4.1 09/05/2018 1321   CL 104 09/05/2018 1321   CO2 28 09/05/2018 1321   GLUCOSE 80 09/05/2018 1321   BUN 14 09/05/2018 1321   CREATININE 1.01 09/05/2018 1321   CREATININE 0.83 01/31/2016 0942   CALCIUM 9.5 09/05/2018 1321   PROT 7.4 09/05/2018 1321   ALBUMIN 3.8 09/05/2018 1321   AST 14 (L) 09/05/2018 1321   ALT 18 09/05/2018 1321   ALKPHOS 55 09/05/2018 1321   BILITOT 0.4 09/05/2018 1321   GFRNONAA >60 09/05/2018 1321   GFRAA >60 09/05/2018 1321    Lab Results  Component Value Date   TOTALPROTELP 7.4 03/03/2018   ALBUMINELP 3.6 03/03/2018   A1GS 0.3 03/03/2018   A2GS 0.9 03/03/2018   BETS 1.2 03/03/2018   GAMS 1.4 03/03/2018   MSPIKE Not Observed 03/03/2018   SPEI Comment 03/03/2018    No results found for: KPAFRELGTCHN, LAMBDASER, KAPLAMBRATIO  Lab Results  Component Value Date   WBC 9.5 01/24/2019   NEUTROABS 6.5 01/24/2019   HGB 15.6 01/24/2019   HCT 47.0 01/24/2019   MCV 85.3 01/24/2019   PLT 340 01/24/2019       Chemistry      Component Value Date/Time   NA 139 09/05/2018 1321   K 4.1 09/05/2018 1321   CL 104 09/05/2018 1321   CO2 28 09/05/2018 1321   BUN 14 09/05/2018 1321   CREATININE 1.01 09/05/2018 1321   CREATININE 0.83 01/31/2016 0942      Component Value Date/Time   CALCIUM 9.5 09/05/2018 1321   ALKPHOS 55 09/05/2018 1321  AST 14 (L) 09/05/2018 1321   ALT 18 09/05/2018 1321   BILITOT 0.4 09/05/2018 1321       No results found for: LABCA2  No components found for: UVOZDG644  No results for input(s): INR in the last 168 hours.  No results found for: LABCA2  No results found for: CAN199  No results found for: IHK742  No results found for: VZD638  No results found for: CA2729  No components found for: HGQUANT  Lab Results  Component Value Date   CEA1 <1.00 09/05/2018   /  CEA ([facility])  Date Value Ref Range Status  09/05/2018 <1.00 0.00 - 5.00 ng/mL Final    Comment:    (NOTE) This test was performed using Architect's Chemiluminescent Microparticle Immunoassay. Values obtained from different assay methods cannot be used interchangeably. Please note that 5-10% of patients who smoke may see CEA levels up to 6.9 ng/mL. Performed at [facility], [address] [place], [place] [address]      No results found for: AFPTUMOR  No results found for: CHROMOGRNA  No results found for: PSA1  No results found for: HGBA, HGBA2QUANT, HGBFQUANT, HGBSQUAN (Hemoglobinopathy evaluation)   Lab Results  Component Value Date   LDH 161 09/05/2018    No results found for: IRON, TIBC, IRONPCTSAT (Iron and TIBC)  Lab Results  Component Value Date   FERRITIN 113 03/03/2018    Urinalysis    Component Value Date/Time   BILIRUBINUR negative 05/11/2013 1657   PROTEINUR negative 05/11/2013 1657   UROBILINOGEN 0.2 05/11/2013 1657   NITRITE negative 05/11/2013 1657   LEUKOCYTESUR Negative 05/11/2013 1657    STUDIES: No  results found.   ASSESSMENT: 34 y.o. [place] man with incident LLE DVT diagnosed by Doppler ultrasonography 01/19/2018 and of the left posterior tibial and left peroneal veins in the setting of an acute gout flare  (a) risk factors include inflammation, immobility, morbid obesity  (b) hypercoagulable screen negative except for a positive lupus anticoagulant February 2020  (c) repeat lupus anticoagulant test August 2020 now negative  1. Acute DVT noted in left lower extremity 01/19/2018  (a) Started on Rivaroxaban in 01/2018  (b) Repeat doppler on 08/16/18 negative for DVT--rivaroxaban stopped   (c) Rivaroxaban prescribed at 10mg  daily 09/05/2018, but never started by patient  2.  Morbid obesity: Diet and exercise discussed with the patient   PLAN: Artice opted against lifelong anticoagulation or at least anticoagulation until he resolves the morbid obesity issue.  We discussed the symptoms that might make him call Aneta Mins back, particularly if he develops unilateral lower extremity swelling or if he develops a sudden "catch" or other problems with his breathing.  I did repeat his lupus anticoagulant today and expected to be again negative.  Otherwise I will let him know.  He understands that he is at high risk of developing a clot because he had a clot previously and because he has morbid obesity.  He tells me he is planning to start exercise program and intensify his diet.  I strongly encouraged that.  We also discussed pandemic precautions.  I am not making a routine return appointment for him here but I will be glad to see him at any point in the future if any problems relating to clotting or any other hematologic issues arise.  I spent [place] total time in this encounter with , MD Medical Oncology and Hematology [facility] [address]  [place], [place] [address] Tel. [phone]    Fax. [phone]   I, Mickie Bail, am acting  as scribe for Dr. Valentino Hue. Irem Stoneham.  I, Ruthann Cancer MD, have reviewed the above documentation for accuracy and completeness, and I agree with the above.

## 2019-01-24 ENCOUNTER — Other Ambulatory Visit: Payer: Self-pay

## 2019-01-24 ENCOUNTER — Other Ambulatory Visit: Payer: Self-pay | Admitting: Family Medicine

## 2019-01-24 ENCOUNTER — Inpatient Hospital Stay: Payer: BLUE CROSS/BLUE SHIELD | Attending: Oncology

## 2019-01-24 ENCOUNTER — Inpatient Hospital Stay (HOSPITAL_BASED_OUTPATIENT_CLINIC_OR_DEPARTMENT_OTHER): Payer: BLUE CROSS/BLUE SHIELD | Admitting: Oncology

## 2019-01-24 DIAGNOSIS — M109 Gout, unspecified: Secondary | ICD-10-CM | POA: Insufficient documentation

## 2019-01-24 DIAGNOSIS — Z7901 Long term (current) use of anticoagulants: Secondary | ICD-10-CM | POA: Diagnosis not present

## 2019-01-24 DIAGNOSIS — I10 Essential (primary) hypertension: Secondary | ICD-10-CM | POA: Diagnosis not present

## 2019-01-24 DIAGNOSIS — Z86718 Personal history of other venous thrombosis and embolism: Secondary | ICD-10-CM | POA: Diagnosis present

## 2019-01-24 DIAGNOSIS — I82412 Acute embolism and thrombosis of left femoral vein: Secondary | ICD-10-CM

## 2019-01-24 DIAGNOSIS — Z79899 Other long term (current) drug therapy: Secondary | ICD-10-CM | POA: Insufficient documentation

## 2019-01-24 DIAGNOSIS — I82492 Acute embolism and thrombosis of other specified deep vein of left lower extremity: Secondary | ICD-10-CM

## 2019-01-24 LAB — CBC WITH DIFFERENTIAL (CANCER CENTER ONLY)
Abs Immature Granulocytes: 0.03 10*3/uL (ref 0.00–0.07)
Basophils Absolute: 0 10*3/uL (ref 0.0–0.1)
Basophils Relative: 0 %
Eosinophils Absolute: 0.3 10*3/uL (ref 0.0–0.5)
Eosinophils Relative: 3 %
HCT: 47 % (ref 39.0–52.0)
Hemoglobin: 15.6 g/dL (ref 13.0–17.0)
Immature Granulocytes: 0 %
Lymphocytes Relative: 20 %
Lymphs Abs: 1.9 10*3/uL (ref 0.7–4.0)
MCH: 28.3 pg (ref 26.0–34.0)
MCHC: 33.2 g/dL (ref 30.0–36.0)
MCV: 85.3 fL (ref 80.0–100.0)
Monocytes Absolute: 0.8 10*3/uL (ref 0.1–1.0)
Monocytes Relative: 8 %
Neutro Abs: 6.5 10*3/uL (ref 1.7–7.7)
Neutrophils Relative %: 69 %
Platelet Count: 340 10*3/uL (ref 150–400)
RBC: 5.51 MIL/uL (ref 4.22–5.81)
RDW: 14.1 % (ref 11.5–15.5)
WBC Count: 9.5 10*3/uL (ref 4.0–10.5)
nRBC: 0 % (ref 0.0–0.2)

## 2019-01-24 LAB — CMP (CANCER CENTER ONLY)
ALT: 20 U/L (ref 0–44)
AST: 20 U/L (ref 15–41)
Albumin: 4.1 g/dL (ref 3.5–5.0)
Alkaline Phosphatase: 54 U/L (ref 38–126)
Anion gap: 9 (ref 5–15)
BUN: 15 mg/dL (ref 6–20)
CO2: 27 mmol/L (ref 22–32)
Calcium: 9.4 mg/dL (ref 8.9–10.3)
Chloride: 103 mmol/L (ref 98–111)
Creatinine: 0.78 mg/dL (ref 0.61–1.24)
GFR, Est AFR Am: >60 mL/min (ref >60)
GFR, Estimated: >60 mL/min (ref >60)
Glucose, Bld: 97 mg/dL (ref 70–99)
Potassium: 4.1 mmol/L (ref 3.5–5.1)
Sodium: 139 mmol/L (ref 135–145)
Total Bilirubin: 0.7 mg/dL (ref 0.3–1.2)
Total Protein: 7.7 g/dL (ref 6.5–8.1)

## 2019-01-24 LAB — LACTATE DEHYDROGENASE: LDH: 184 U/L (ref 98–192)

## 2019-01-25 LAB — LUPUS ANTICOAGULANT PANEL
DRVVT: 43.3 s (ref 0.0–47.0)
PTT Lupus Anticoagulant: 39.7 s (ref 0.0–51.9)

## 2019-02-19 ENCOUNTER — Other Ambulatory Visit: Payer: Self-pay | Admitting: Family Medicine

## 2019-03-08 ENCOUNTER — Other Ambulatory Visit: Payer: Self-pay | Admitting: Family Medicine

## 2019-03-16 ENCOUNTER — Other Ambulatory Visit: Payer: Self-pay | Admitting: Family Medicine

## 2019-03-22 ENCOUNTER — Encounter: Payer: BLUE CROSS/BLUE SHIELD | Admitting: Family Medicine

## 2019-04-10 ENCOUNTER — Encounter: Payer: BLUE CROSS/BLUE SHIELD | Admitting: Family Medicine

## 2019-04-18 ENCOUNTER — Other Ambulatory Visit: Payer: Self-pay | Admitting: Family Medicine

## 2019-05-23 ENCOUNTER — Other Ambulatory Visit: Payer: Self-pay | Admitting: Family Medicine

## 2019-06-24 ENCOUNTER — Other Ambulatory Visit: Payer: Self-pay | Admitting: Family Medicine

## 2019-07-18 ENCOUNTER — Ambulatory Visit: Payer: BLUE CROSS/BLUE SHIELD

## 2019-07-18 ENCOUNTER — Ambulatory Visit: Payer: BLUE CROSS/BLUE SHIELD | Attending: Internal Medicine

## 2019-07-18 ENCOUNTER — Other Ambulatory Visit: Payer: Self-pay

## 2019-07-18 DIAGNOSIS — Z23 Encounter for immunization: Secondary | ICD-10-CM

## 2019-07-18 NOTE — Progress Notes (Signed)
   Covid-19 Vaccination Clinic  Name:  Danny Gomez    MRN: [id number] DOB: 1985-11-02  07/18/2019  [name] was observed post Covid-19 immunization for 15 minutes without incident. He was provided with Vaccine Information Sheet and instruction to access the V-Safe system.   Verified  with Earle Gell RN at [facility] that [name] received his vaccine at [facility] by Earle Gell RN.  Per Earle Gell RN patient was observed for 15 minutes and the following was reviewed prior to discharge.  [name] was instructed to call 911 with any severe reactions post vaccine: [name] Kitchen Difficulty breathing  . Swelling of face and throat  . A fast heartbeat  . A bad rash all over body  . Dizziness and weakness   Immunizations Administered    Name Date Dose VIS Date Route   Moderna COVID-19 Vaccine 07/18/2019 10:14 AM 0.5 mL 12/2018 Intramuscular   Manufacturer: Moderna   Lot: [id number]   NDC: 06237-628-31

## 2019-07-19 ENCOUNTER — Encounter: Payer: BLUE CROSS/BLUE SHIELD | Admitting: Family Medicine

## 2019-07-20 ENCOUNTER — Other Ambulatory Visit: Payer: Self-pay | Admitting: Family Medicine

## 2019-07-26 ENCOUNTER — Telehealth: Payer: Self-pay

## 2019-07-26 NOTE — Telephone Encounter (Signed)
Headache may be attrib uted to the vaccine, use tylenol for relief. Diarrhea not likely from the vaccine. Follow BRAT diet and ensure remains hydrated. If need be limited use of lomoyil oTC for symptoms or if severe diarrhea clinic eval or urgent care

## 2019-07-26 NOTE — Telephone Encounter (Signed)
Patient states he got his first Moderna vaccine last Tuesday and ever since he got this vaccine he has had a dull headache. He is having diarrhea every time he eats something. Is wondering if this could be due to the vaccine. Please advise.

## 2019-07-26 NOTE — Telephone Encounter (Signed)
Patient in agreement with treatment plan 

## 2019-07-26 NOTE — Telephone Encounter (Signed)
Please call the pt he has diarrhea, weak, just got the covid shot

## 2019-07-31 ENCOUNTER — Other Ambulatory Visit: Payer: Self-pay

## 2019-07-31 ENCOUNTER — Ambulatory Visit (INDEPENDENT_AMBULATORY_CARE_PROVIDER_SITE_OTHER): Payer: BLUE CROSS/BLUE SHIELD | Admitting: Family Medicine

## 2019-07-31 ENCOUNTER — Encounter: Payer: Self-pay | Admitting: Family Medicine

## 2019-07-31 VITALS — BP 134/82 | HR 73 | Resp 16 | Ht 71.0 in | Wt 351.1 lb

## 2019-07-31 DIAGNOSIS — Z0000 Encounter for general adult medical examination without abnormal findings: Secondary | ICD-10-CM | POA: Diagnosis not present

## 2019-07-31 DIAGNOSIS — Z131 Encounter for screening for diabetes mellitus: Secondary | ICD-10-CM | POA: Diagnosis not present

## 2019-07-31 DIAGNOSIS — I10 Essential (primary) hypertension: Secondary | ICD-10-CM | POA: Diagnosis not present

## 2019-07-31 DIAGNOSIS — E559 Vitamin D deficiency, unspecified: Secondary | ICD-10-CM | POA: Diagnosis not present

## 2019-07-31 DIAGNOSIS — Z1159 Encounter for screening for other viral diseases: Secondary | ICD-10-CM

## 2019-07-31 NOTE — Progress Notes (Signed)
   Danny Gomez     MRN: [id number]      DOB: [birth year]   HPI: Patient is in for annual physical exam. No other health concerns are expressed except for obesity Recent labs, if available are reviewed. Immunization is reviewed , and  updated if needed.    PE; BP 134/82   Pulse 73   Resp 16   Ht 5\' 11"  (1.803 m)   Wt (!) 351 lb 1.9 oz (159.3 kg)   SpO2 98%   BMI 48.97 kg/m   Pleasant male, alert and oriented x 3, in no cardio-pulmonary distress. Afebrile. HEENT No facial trauma or asymetry. Sinuses non tender. EOMI External ears normal,  Neck: supple, no adenopathy,JVD or thyromegaly.No bruits.  Chest: Clear to ascultation bilaterally.No crackles or wheezes. Non tender to palpation  Cardiovascular system; Heart sounds normal,  S1 and  S2 ,no S3.  No murmur, or thrill. Apical beat not displaced Peripheral pulses normal.  Abdomen: Soft, non tender, no organomegaly or masses. No bruits. Bowel sounds normal. No guarding, tenderness or rebound.    Musculoskeletal exam: Full ROM of spine, hips , shoulders and knees. No deformity ,swelling or crepitus noted. No muscle wasting or atrophy.   Neurologic: Cranial nerves 2 to 12 intact. Power, tone ,sensation and reflexes normal throughout. No disturbance in gait. No tremor.  Skin: Intact, no ulceration, erythema , scaling or rash noted. Pigmentation normal throughout  Psych; Normal mood and affect. Judgement and concentration normal   Assessment & Plan:  Annual physical exam Annual exam as documented. Counseling done  re healthy lifestyle involving commitment to 150 minutes exercise per week, heart healthy diet, and attaining healthy weight.The importance of adequate sleep also discussed. Regular seat belt use and home safety, is also discussed. Changes in health habits are decided on by the patient with goals and time frames  set for achieving them. Immunization and cancer screening needs are specifically  addressed at this visit.   Morbid obesity (HCC)  Patient re-educated about  the importance of commitment to a  minimum of 150 minutes of exercise per week as able.  The importance of healthy food choices with portion control discussed, as well as eating regularly and within a 12 hour window most days. The need to choose "clean , green" food 50 to 75% of the time is discussed, as well as to make water the primary drink and set a goal of 64 ounces water daily.    Weight /BMI 07/31/2019 01/24/2019 09/14/2018  WEIGHT 351 lb 1.9 oz 347 lb 11.2 oz 333 lb  HEIGHT 5\' 11"  5\' 11"  5\' 11"   BMI 48.97 kg/m2 48.49 kg/m2 46.44 kg/m2  start xenical

## 2019-07-31 NOTE — Patient Instructions (Signed)
Follow-up in office with MD in 4 months call if you need me sooner.  Labs today CBC lipid panel CMP and EGFR TSH vitamin D hemoglobin A1c and hepatitis C screen.  Please start a food diary.  Please commit to eating a diet rich in food and vegetable.  Please commit to drinking at least 64 ounces of water daily.  Please commit to eating between 7 AM and 7 PM and then stopping.  Please commit to eating 3 meals daily with snacks in between of fresh fruit or vegetables.  Please continue to commit to exercise for at least 30 minutes 5 days/week.  Please contact your health insurance to see if you can get a health coach to help you on your journey with weight management.  Blood pressure today is good no change in medication.  I will contact you with coverage for the medication your request for weight loss Saxenda  Thanks for choosing [facility], we consider it a privelige to serve you.

## 2019-07-31 NOTE — Assessment & Plan Note (Signed)

## 2019-08-01 ENCOUNTER — Other Ambulatory Visit: Payer: Self-pay | Admitting: Family Medicine

## 2019-08-01 ENCOUNTER — Encounter: Payer: Self-pay | Admitting: Family Medicine

## 2019-08-01 LAB — CBC
HCT: 47.2 % (ref 38.5–50.0)
Hemoglobin: 15.8 g/dL (ref 13.2–17.1)
MCH: 29 pg (ref 27.0–33.0)
MCHC: 33.5 g/dL (ref 32.0–36.0)
MCV: 86.6 fL (ref 80.0–100.0)
MPV: 8.8 fL (ref 7.5–12.5)
Platelets: 352 10*3/uL (ref 140–400)
RBC: 5.45 10*6/uL (ref 4.20–5.80)
RDW: 13.9 % (ref 11.0–15.0)
WBC: 7.9 10*3/uL (ref 3.8–10.8)

## 2019-08-01 LAB — HEMOGLOBIN A1C
Hgb A1c MFr Bld: 5.6 % of total Hgb (ref <5.7)
Mean Plasma Glucose: 114 (calc)
eAG (mmol/L): 6.3 (calc)

## 2019-08-01 LAB — COMPLETE METABOLIC PANEL WITH GFR
AG Ratio: 1.4 (calc) (ref 1.0–2.5)
ALT: 20 U/L (ref 9–46)
AST: 19 U/L (ref 10–40)
Albumin: 4.4 g/dL (ref 3.6–5.1)
Alkaline phosphatase (APISO): 55 U/L (ref 36–130)
BUN: 15 mg/dL (ref 7–25)
CO2: 27 mmol/L (ref 20–32)
Calcium: 9.8 mg/dL (ref 8.6–10.3)
Chloride: 100 mmol/L (ref 98–110)
Creat: 0.81 mg/dL (ref 0.60–1.35)
GFR, Est African American: 135 mL/min/{1.73_m2} (ref >=60)
GFR, Est Non African American: 117 mL/min/{1.73_m2} (ref >=60)
Globulin: 3.2 g/dL (calc) (ref 1.9–3.7)
Glucose, Bld: 76 mg/dL (ref 65–99)
Potassium: 4.3 mmol/L (ref 3.5–5.3)
Sodium: 137 mmol/L (ref 135–146)
Total Bilirubin: 0.5 mg/dL (ref 0.2–1.2)
Total Protein: 7.6 g/dL (ref 6.1–8.1)

## 2019-08-01 LAB — LIPID PANEL
Cholesterol: 145 mg/dL (ref <200)
HDL: 24 mg/dL — ABNORMAL LOW (ref >=40)
LDL Cholesterol (Calc): 93 mg/dL (calc)
Non-HDL Cholesterol (Calc): 121 mg/dL (calc) (ref <130)
Total CHOL/HDL Ratio: 6 (calc) — ABNORMAL HIGH (ref <5.0)
Triglycerides: 185 mg/dL — ABNORMAL HIGH (ref <150)

## 2019-08-01 LAB — HEPATITIS C ANTIBODY
Hepatitis C Ab: NONREACTIVE
SIGNAL TO CUT-OFF: 0.19 (ref <1.00)

## 2019-08-01 LAB — VITAMIN D 25 HYDROXY (VIT D DEFICIENCY, FRACTURES): Vit D, 25-Hydroxy: 10 ng/mL — ABNORMAL LOW (ref 30–100)

## 2019-08-01 MED ORDER — ORLISTAT 120 MG PO CAPS: 120.0000 mg | ORAL_CAPSULE | Freq: Three times a day (TID) | ORAL | 3 refills | Status: DC

## 2019-08-01 MED ORDER — ERGOCALCIFEROL 1.25 MG (50000 UT) PO CAPS: 50000.0000 [IU] | ORAL_CAPSULE | ORAL | 2 refills | Status: DC

## 2019-08-01 NOTE — Assessment & Plan Note (Signed)
  Patient re-educated about  the importance of commitment to a  minimum of 150 minutes of exercise per week as able.  The importance of healthy food choices with portion control discussed, as well as eating regularly and within a 12 hour window most days. The need to choose "clean , green" food 50 to 75% of the time is discussed, as well as to make water the primary drink and set a goal of 64 ounces water daily.    Weight /BMI 07/31/2019 01/24/2019 09/14/2018  WEIGHT 351 lb 1.9 oz 347 lb 11.2 oz 333 lb  HEIGHT 5\' 11"  5\' 11"  5\' 11"   BMI 48.97 kg/m2 48.49 kg/m2 46.44 kg/m2  start xenical

## 2019-08-15 ENCOUNTER — Ambulatory Visit: Payer: BLUE CROSS/BLUE SHIELD | Attending: Family

## 2019-08-15 DIAGNOSIS — Z23 Encounter for immunization: Secondary | ICD-10-CM

## 2019-08-15 NOTE — Progress Notes (Signed)
   Covid-19 Vaccination Clinic  Name:  LAKSH HINNERS    MRN: [id number] DOB: [birth year]  08/15/2019  Mr. Pinedo was observed post Covid-19 immunization for 15 minutes without incident. He was provided with Vaccine Information Sheet and instruction to access the V-Safe system.   Mr. Treml was instructed to call 911 with any severe reactions post vaccine: [name] Kitchen Difficulty breathing  . Swelling of face and throat  . A fast heartbeat  . A bad rash all over body  . Dizziness and weakness   Immunizations Administered    Name Date Dose VIS Date Route   Moderna COVID-19 Vaccine 08/15/2019  4:06 PM 0.5 mL 12/2018 Intramuscular   Manufacturer: Moderna   Lot: [id number]   NDC: 13086-578-46

## 2019-08-23 ENCOUNTER — Other Ambulatory Visit: Payer: Self-pay | Admitting: Family Medicine

## 2019-09-12 ENCOUNTER — Other Ambulatory Visit: Payer: Self-pay | Admitting: Family Medicine

## 2019-09-23 ENCOUNTER — Other Ambulatory Visit: Payer: Self-pay | Admitting: Family Medicine

## 2019-10-09 ENCOUNTER — Encounter: Payer: BLUE CROSS/BLUE SHIELD | Admitting: Family Medicine

## 2019-10-17 ENCOUNTER — Telehealth (INDEPENDENT_AMBULATORY_CARE_PROVIDER_SITE_OTHER): Payer: BLUE CROSS/BLUE SHIELD | Admitting: Internal Medicine

## 2019-10-17 ENCOUNTER — Other Ambulatory Visit: Payer: BLUE CROSS/BLUE SHIELD

## 2019-10-17 ENCOUNTER — Encounter: Payer: Self-pay | Admitting: Internal Medicine

## 2019-10-17 ENCOUNTER — Other Ambulatory Visit: Payer: Self-pay

## 2019-10-17 VITALS — BP 134/82 | Ht 71.0 in | Wt 351.0 lb

## 2019-10-17 DIAGNOSIS — Z20822 Contact with and (suspected) exposure to covid-19: Secondary | ICD-10-CM

## 2019-10-17 DIAGNOSIS — J069 Acute upper respiratory infection, unspecified: Secondary | ICD-10-CM | POA: Diagnosis not present

## 2019-10-17 NOTE — Progress Notes (Signed)
Virtual Visit via Telephone Note   This visit type was conducted due to national recommendations for restrictions regarding the COVID-19 Pandemic (e.g. social distancing) in an effort to limit this patient's exposure and mitigate transmission in our community.  Due to his co-morbid illnesses, this patient is at least at moderate risk for complications without adequate follow up.  This format is felt to be most appropriate for this patient at this time.  The patient did not have access to video technology/had technical difficulties with video requiring transitioning to audio format only (telephone).  All issues noted in this document were discussed and addressed.  No physical exam could be performed with this format.  Evaluation Performed:  Follow-up visit  Date:  10/17/2019   ID:  Danny Gomez, DOB 06/26/1985, MRN [id number]  Patient Location: Home Provider Location: Office/Clinic  Location of Patient: Home Location of Provider: Telehealth Consent was obtain for visit to be over via telehealth. I verified that I am speaking with the correct person using two identifiers.  PCP:  Kerri Perches, MD   Chief Complaint:  Cough and nasal congestion  History of Present Illness:    Danny Gomez is a 34 y.o. male with past medical history of hypertension and allergic rhinitis as a televisit for complaint of dry cough and nasal congestion for about a week.  He also complains of throat pain and sinus pressure.  He denies any fever, chills or any change in taste or smell sensation.  Patient denies any known exposure to Covid positive person.  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  He has had both doses of Covid vaccine, second dose about 2 months ago.  The patient does have symptoms concerning for COVID-19 infection (fever, chills, cough, or new shortness of breath).   Past Medical, Surgical, Social History, Allergies, and  Medications have been Reviewed.  Past Medical History:  Diagnosis Date  . Asthma    pt. reports as a child he used an inhaler , but asthma has resoled in the adult path  . Febrile illness   . Headache(784.0)   . Hypertension   . Neuromuscular disorder (HCC)    hnp- L4-5, L5-S1  . Obesity    Past Surgical History:  Procedure Laterality Date  . right ankle surgery  2002   bones had fused but it was corrected surgically  . TONSILLECTOMY       Current Meds  Medication Sig  . acetaminophen (TYLENOL 8 HOUR ARTHRITIS PAIN) 650 MG CR tablet Take 1,300 mg by mouth every 8 (eight) hours as needed for pain.  [name] Kitchen allopurinol (ZYLOPRIM) 100 MG tablet Take 1 tablet by mouth once daily  . amLODipine (NORVASC) 10 MG tablet Take 1 tablet by mouth once daily  . ergocalciferol (VITAMIN D2) 1.25 MG (50000 UT) capsule Take 1 capsule (50,000 Units total) by mouth once a week. One capsule once weekly  . lisinopril-hydrochlorothiazide (ZESTORETIC) 20-25 MG tablet TAKE 1 TABLET BY MOUTH ONCE DAILY **NEEDS  FOLLOW  UP  APPOINTMENT**  . orlistat (XENICAL) 120 MG capsule Take 1 capsule (120 mg total) by mouth 3 (three) times daily with meals.     Allergies:   Patient has no known allergies.   ROS:   Please see the history of present illness.    All other systems reviewed and are negative.   Labs/Other Tests and Data Reviewed:    Recent Labs:  07/31/2019: ALT 20; BUN 15; Creat 0.81; Hemoglobin 15.8; Platelets 352; Potassium 4.3; Sodium 137   Recent Lipid Panel Lab Results  Component Value Date/Time   CHOL 145 07/31/2019 12:45 PM   TRIG 185 (H) 07/31/2019 12:45 PM   HDL 24 (L) 07/31/2019 12:45 PM   CHOLHDL 6.0 (H) 07/31/2019 12:45 PM   LDLCALC 93 07/31/2019 12:45 PM    Wt Readings from Last 3 Encounters:  10/17/19 (!) 351 lb (159.2 kg)  07/31/19 (!) 351 lb 1.9 oz (159.3 kg)  01/24/19 (!) 347 lb 11.2 oz (157.7 kg)     Objective:    Vital Signs:  BP 134/82   Ht 5\' 11"  (1.803 m)   Wt (!) 351  lb (159.2 kg)   BMI 48.95 kg/m    VITAL SIGNS:  reviewed  ASSESSMENT & PLAN:    Upper respiratory tract infection Suspected COVID-19 infection Cough, runny nose, nasal congestion and sinus pressure for 1 week, no dyspnea Will schedule for COVID testing Symptomatic treatment instructed Will prescribe antibiotic if COVID-negative with persistent symptoms Advised to self-quarantine and follow hand hygiene  Time:   Today, I have spent 10 minutes with the patient with telehealth technology discussing the above problems.     Medication Adjustments/Labs and Tests Ordered: Current medicines are reviewed at length with the patient today.  Concerns regarding medicines are outlined above.   Tests Ordered: No orders of the defined types were placed in this encounter.   Medication Changes: No orders of the defined types were placed in this encounter.   Disposition:  Follow up  Signed, , MD  10/17/2019 11:03 AM     10/19/2019 [facility]

## 2019-10-17 NOTE — Patient Instructions (Addendum)
Please get COVID testing done as scheduled. You are advised to self-quarantine till COVID test result comes back. Please continue to follow hand hygiene. You are advised to use Nasal saline spray for nasal congestion. Okay to take Tylenol up to 4 times in a day for fever or muscle aches. Okay to take over-the-counter cold and sinus medications (I.e., Advil cold and sinus, Nyquil).  Upper Respiratory Infection, Adult An upper respiratory infection (URI) is a common viral infection of the nose, throat, and upper air passages that lead to the lungs. The most common type of URI is the common cold. URIs usually get better on their own, without medical treatment.  How is this treated? URIs usually get better on their own within 7-10 days. You can take steps at home to relieve your symptoms. Medicines cannot cure URIs, but your health care provider may recommend certain medicines to help relieve symptoms, such as:  Over-the-counter cold medicines.  Cough suppressants. Coughing is a type of defense against infection that helps to clear the respiratory system, so take these medicines only as recommended by your health care provider.  Fever-reducing medicines. Follow these instructions at home: Activity  Rest as needed.  If you have a fever, stay home from work or school until your fever is gone or until your health care provider says you are no longer contagious. Your health care provider may have you wear a face mask to prevent your infection from spreading. Relieving symptoms  Gargle with a salt-water mixture 3-4 times a day or as needed. To make a salt-water mixture, completely dissolve -1 tsp of salt in 1 cup of warm water.  Use a cool-mist humidifier to add moisture to the air. This can help you breathe more easily. Eating and drinking   Drink enough fluid to keep your urine pale yellow.  Eat soups and other clear broths. General instructions   Take over-the-counter and prescription  medicines only as told by your health care provider. These include cold medicines, fever reducers, and cough suppressants.  Do not use any products that contain nicotine or tobacco, such as cigarettes and e-cigarettes. If you need help quitting, ask your health care provider.  Stay away from secondhand smoke.  Stay up to date on all immunizations, including the yearly (annual) flu vaccine.  Keep all follow-up visits as told by your health care provider. This is important. How to prevent the spread of infection to others   URIs can be passed from person to person (are contagious). To prevent the infection from spreading: ? Wash your hands often with soap and water. If soap and water are not available, use hand sanitizer. ? Avoid touching your mouth, face, eyes, or nose. ? Cough or sneeze into a tissue or your sleeve or elbow instead of into your hand or into the air. Contact a health care provider if:  You are getting worse instead of better.  You have a fever or chills.  Your mucus is brown or red.  You have yellow or brown discharge coming from your nose.  You have pain in your face, especially when you bend forward.  You have swollen neck glands.  You have pain while swallowing.  You have white areas in the back of your throat. Get help right away if:  You have shortness of breath that gets worse.  You have severe or persistent: ? Headache. ? Ear pain. ? Sinus pain. ? Chest pain.  You have chronic lung disease along with any of  the following: ? Wheezing. ? Prolonged cough. ? Coughing up blood. ? A change in your usual mucus.  You have a stiff neck.  You have changes in your: ? Vision. ? Hearing. ? Thinking. ? Mood. Summary  An upper respiratory infection (URI) is a common infection of the nose, throat, and upper air passages that lead to the lungs.  A URI is caused by a virus.  URIs usually get better on their own within 7-10 days.  Medicines cannot  cure URIs, but your health care provider may recommend certain medicines to help relieve symptoms. This information is not intended to replace advice given to you by your health care provider. Make sure you discuss any questions you have with your health care provider. Document Revised: 01/13/2018 Document Reviewed: 08/21/2016 Elsevier Patient Education  2020 [employer].

## 2019-10-18 LAB — SARS-COV-2, NAA 2 DAY TAT

## 2019-10-18 LAB — NOVEL CORONAVIRUS, NAA: SARS-CoV-2, NAA: NOT DETECTED

## 2019-10-26 ENCOUNTER — Other Ambulatory Visit: Payer: Self-pay | Admitting: Family Medicine

## 2019-11-22 ENCOUNTER — Other Ambulatory Visit: Payer: Self-pay | Admitting: Family Medicine

## 2019-11-26 ENCOUNTER — Encounter: Payer: Self-pay | Admitting: Family Medicine

## 2019-11-26 ENCOUNTER — Other Ambulatory Visit: Payer: Self-pay | Admitting: Family Medicine

## 2019-11-26 MED ORDER — AMLODIPINE BESYLATE 10 MG PO TABS: 10.0000 mg | ORAL_TABLET | Freq: Every day | ORAL | 3 refills | Status: DC

## 2019-11-27 ENCOUNTER — Encounter: Payer: Self-pay | Admitting: Internal Medicine

## 2019-11-27 ENCOUNTER — Other Ambulatory Visit: Payer: Self-pay

## 2019-11-27 ENCOUNTER — Telehealth (INDEPENDENT_AMBULATORY_CARE_PROVIDER_SITE_OTHER): Payer: BLUE CROSS/BLUE SHIELD | Admitting: Internal Medicine

## 2019-11-27 DIAGNOSIS — J069 Acute upper respiratory infection, unspecified: Secondary | ICD-10-CM

## 2019-11-27 DIAGNOSIS — T7840XS Allergy, unspecified, sequela: Secondary | ICD-10-CM

## 2019-11-27 MED ORDER — ALLOPURINOL 100 MG PO TABS
100.0000 mg | ORAL_TABLET | Freq: Every day | ORAL | 0 refills | Status: DC
Start: 2019-11-27 — End: 2019-12-28

## 2019-11-27 MED ORDER — AMOXICILLIN 500 MG PO CAPS: 500.0000 mg | ORAL_CAPSULE | Freq: Two times a day (BID) | ORAL | 0 refills | Status: DC

## 2019-11-27 NOTE — Patient Instructions (Signed)
Upper Respiratory Infection, Adult An upper respiratory infection (URI) is a common viral infection of the nose, throat, and upper air passages that lead to the lungs. The most common type of URI is the common cold. URIs usually get better on their own, without medical treatment. What are the causes? A URI is caused by a virus. You may catch a virus by:  Breathing in droplets from an infected person's cough or sneeze.  Touching something that has been exposed to the virus (contaminated) and then touching your mouth, nose, or eyes. What increases the risk? You are more likely to get a URI if:  You are very young or very old.  It is autumn or winter.  You have close contact with others, such as at a daycare, school, or health care facility.  You smoke.  You have long-term (chronic) heart or lung disease.  You have a weakened disease-fighting (immune) system.  You have nasal allergies or asthma.  You are experiencing a lot of stress.  You work in an area that has poor air circulation.  You have poor nutrition. What are the signs or symptoms? A URI usually involves some of the following symptoms:  Runny or stuffy (congested) nose.  Sneezing.  Cough.  Sore throat.  Headache.  Fatigue.  Fever.  Loss of appetite.  Pain in your forehead, behind your eyes, and over your cheekbones (sinus pain).  Muscle aches.  Redness or irritation of the eyes.  Pressure in the ears or face. How is this diagnosed? This condition may be diagnosed based on your medical history and symptoms, and a physical exam. Your health care provider may use a cotton swab to take a mucus sample from your nose (nasal swab). This sample can be tested to determine what virus is causing the illness. How is this treated? URIs usually get better on their own within 7-10 days. You can take steps at home to relieve your symptoms. Medicines cannot cure URIs, but your health care provider may recommend  certain medicines to help relieve symptoms, such as:  Over-the-counter cold medicines.  Cough suppressants. Coughing is a type of defense against infection that helps to clear the respiratory system, so take these medicines only as recommended by your health care provider.  Fever-reducing medicines. Follow these instructions at home: Activity  Rest as needed.  If you have a fever, stay home from work or school until your fever is gone or until your health care provider says you are no longer contagious. Your health care provider may have you wear a face mask to prevent your infection from spreading. Relieving symptoms  Gargle with a salt-water mixture 3-4 times a day or as needed. To make a salt-water mixture, completely dissolve -1 tsp of salt in 1 cup of warm water.  Use a cool-mist humidifier to add moisture to the air. This can help you breathe more easily. Eating and drinking   Drink enough fluid to keep your urine pale yellow.  Eat soups and other clear broths. General instructions   Take over-the-counter and prescription medicines only as told by your health care provider. These include cold medicines, fever reducers, and cough suppressants.  Do not use any products that contain nicotine or tobacco, such as cigarettes and e-cigarettes. If you need help quitting, ask your health care provider.  Stay away from secondhand smoke.  Stay up to date on all immunizations, including the yearly (annual) flu vaccine.  Keep all follow-up visits as told by your health   care provider. This is important. How to prevent the spread of infection to others   URIs can be passed from person to person (are contagious). To prevent the infection from spreading: ? Wash your hands often with soap and water. If soap and water are not available, use hand sanitizer. ? Avoid touching your mouth, face, eyes, or nose. ? Cough or sneeze into a tissue or your sleeve or elbow instead of into your hand  or into the air. Contact a health care provider if:  You are getting worse instead of better.  You have a fever or chills.  Your mucus is brown or red.  You have yellow or brown discharge coming from your nose.  You have pain in your face, especially when you bend forward.  You have swollen neck glands.  You have pain while swallowing.  You have white areas in the back of your throat. Get help right away if:  You have shortness of breath that gets worse.  You have severe or persistent: ? Headache. ? Ear pain. ? Sinus pain. ? Chest pain.  You have chronic lung disease along with any of the following: ? Wheezing. ? Prolonged cough. ? Coughing up blood. ? A change in your usual mucus.  You have a stiff neck.  You have changes in your: ? Vision. ? Hearing. ? Thinking. ? Mood. Summary  An upper respiratory infection (URI) is a common infection of the nose, throat, and upper air passages that lead to the lungs.  A URI is caused by a virus.  URIs usually get better on their own within 7-10 days.  Medicines cannot cure URIs, but your health care provider may recommend certain medicines to help relieve symptoms. This information is not intended to replace advice given to you by your health care provider. Make sure you discuss any questions you have with your health care provider. Document Revised: 01/13/2018 Document Reviewed: 08/21/2016 Elsevier Patient Education  2020 [employer]    

## 2019-11-27 NOTE — Telephone Encounter (Signed)
Pt made a phone visit with patel 11-27-19

## 2019-11-27 NOTE — Progress Notes (Signed)
Virtual Visit via Telephone Note   This visit type was conducted due to national recommendations for restrictions regarding the COVID-19 Pandemic (e.g. social distancing) in an effort to limit this patient's exposure and mitigate transmission in our community.  Due to his co-morbid illnesses, this patient is at least at moderate risk for complications without adequate follow up.  This format is felt to be most appropriate for this patient at this time.  The patient did not have access to video technology/had technical difficulties with video requiring transitioning to audio format only (telephone).  All issues noted in this document were discussed and addressed.  No physical exam could be performed with this format.  Evaluation Performed:  Follow-up visit  Date:  11/27/2019   ID:  [name], DOB 1985/06/25, MRN [id number]  Patient Location: Home Provider Location: Office/Clinic  Location of Patient: Home Location of Provider: Telehealth Consent was obtain for visit to be over via telehealth. I verified that I am speaking with the correct person using two identifiers.  PCP:  Kerri Perches, MD   Chief Complaint:  Nasal congestion, runny nose  History of Present Illness:    Danny Gomez is a 34 y.o. male with with past medical history of hypertension and allergic rhinitis as a televisit for complaint of nasal congestion and runny nose for about a month.  Symptoms have been on and off.  He also complains of throat pain and sinus pressure.  He denies any fever, chills or any change in taste or smell sensation.  Patient denies any known exposure to Covid positive person.  Patient also complains of itchiness over the tongue when he tries to eat.  He denies any mouth ulcers, but states that he has noticed some redness over the tongue.  Patient has started taking Zyrtec for his allergies, which has helped with the itchiness over the tongue.  The patient does not have symptoms  concerning for COVID-19 infection (fever, chills, cough, or new shortness of breath).   Past Medical, Surgical, Social History, Allergies, and Medications have been Reviewed.  Past Medical History:  Diagnosis Date  . Asthma    pt. reports as a child he used an inhaler , but asthma has resoled in the adult path  . Febrile illness   . Headache(784.0)   . Hypertension   . Neuromuscular disorder (HCC)    hnp- L4-5, L5-S1  . Obesity    Past Surgical History:  Procedure Laterality Date  . right ankle surgery  2002   bones had fused but it was corrected surgically  . TONSILLECTOMY       No outpatient medications have been marked as taking for the 11/27/19 encounter (Video Visit) with Anabel Halon, MD.     Allergies:   Patient has no known allergies.   ROS:   Please see the history of present illness.     All other systems reviewed and are negative.   Labs/Other Tests and Data Reviewed:    Recent Labs: 07/31/2019: ALT 20; BUN 15; Creat 0.81; Hemoglobin 15.8; Platelets 352; Potassium 4.3; Sodium 137   Recent Lipid Panel Lab Results  Component Value Date/Time   CHOL 145 07/31/2019 12:45 PM   TRIG 185 (H) 07/31/2019 12:45 PM   HDL 24 (L) 07/31/2019 12:45 PM   CHOLHDL 6.0 (H) 07/31/2019 12:45 PM   LDLCALC 93 07/31/2019 12:45 PM    Wt Readings from Last 3 Encounters:  10/17/19 (!) 351 lb (159.2 kg)  07/31/19 (!) 351 lb 1.9 oz (159.3 kg)  01/24/19 (!) 347 lb 11.2 oz (157.7 kg)      ASSESSMENT & PLAN:    Upper respiratory tract infection Nasal congestion and runny nose intermittently with sore throat Prescribed Amoxicillin Advised to continue Zyrtec Salt/warm water gargles   Tongue itchiness/mouth sores Advised to take Folic acid Avoid hot/spicy food  Time:   Today, I have spent 10 minutes with the patient with telehealth technology discussing the above problems.     Medication Adjustments/Labs and Tests Ordered: Current medicines are reviewed at length with  the patient today.  Concerns regarding medicines are outlined above.   Tests Ordered: No orders of the defined types were placed in this encounter.   Medication Changes: Meds ordered this encounter  Medications  . amoxicillin (AMOXIL) 500 MG capsule    Sig: Take 1 capsule (500 mg total) by mouth 2 (two) times daily.    Dispense:  20 capsule    Refill:  0     Note: This dictation was prepared with Dragon dictation along with smaller phrase technology. Similar sounding words can be transcribed inadequately or may not be corrected upon review. Any transcriptional errors that result from this process are unintentional.      Disposition:  Follow up  Signed, Anabel Halon, MD  11/27/2019 8:28 AM     [facility]

## 2019-11-29 ENCOUNTER — Telehealth: Payer: Self-pay

## 2019-11-29 NOTE — Telephone Encounter (Signed)
H mills prescribed allopurinol on 11/08, pls re send and let him know

## 2019-11-29 NOTE — Telephone Encounter (Signed)
Advised that the medication was sent in

## 2019-11-29 NOTE — Telephone Encounter (Signed)
Danny Gomez is calling to get gout medication refilled for her Son. He has been trying for 2 weeks now to get the medication

## 2019-12-04 ENCOUNTER — Telehealth (INDEPENDENT_AMBULATORY_CARE_PROVIDER_SITE_OTHER): Payer: BLUE CROSS/BLUE SHIELD | Admitting: Family Medicine

## 2019-12-04 ENCOUNTER — Other Ambulatory Visit: Payer: Self-pay

## 2019-12-04 ENCOUNTER — Ambulatory Visit: Payer: BLUE CROSS/BLUE SHIELD | Admitting: Family Medicine

## 2019-12-04 ENCOUNTER — Encounter: Payer: Self-pay | Admitting: Family Medicine

## 2019-12-04 VITALS — Ht 71.0 in | Wt 331.0 lb

## 2019-12-04 DIAGNOSIS — Z13220 Encounter for screening for lipoid disorders: Secondary | ICD-10-CM

## 2019-12-04 DIAGNOSIS — R7302 Impaired glucose tolerance (oral): Secondary | ICD-10-CM

## 2019-12-04 DIAGNOSIS — E790 Hyperuricemia without signs of inflammatory arthritis and tophaceous disease: Secondary | ICD-10-CM | POA: Diagnosis not present

## 2019-12-04 DIAGNOSIS — I10 Essential (primary) hypertension: Secondary | ICD-10-CM

## 2019-12-04 DIAGNOSIS — M542 Cervicalgia: Secondary | ICD-10-CM | POA: Diagnosis not present

## 2019-12-04 MED ORDER — LISINOPRIL-HYDROCHLOROTHIAZIDE 20-25 MG PO TABS: 1.0000 | ORAL_TABLET | Freq: Every day | ORAL | 3 refills | Status: DC

## 2019-12-04 NOTE — Patient Instructions (Addendum)
F/u in office with MD first week in March, call if you need me sooner, Monday no later than 9 am  CONGRATS on weight loss , keep up great new habits   Fasting lipid, chem 7 and EGFR , uric acid and tSH 1 wek before next visit  Please get X ray of neck at [place]  Please get your flu vaccine  Think about what you will eat, plan ahead. Choose " clean, green, fresh or frozen" over canned, processed or packaged foods which are more sugary, salty and fatty. 70 to 75% of food eaten should be vegetables and fruit. Three meals at set times with snacks allowed between meals, but they must be fruit or vegetables. Aim to eat over a 12 hour period , example 7 am to 7 pm, and STOP after  your last meal of the day. Drink water,generally about 64 ounces per day, no other drink is as healthy. Fruit juice is best enjoyed in a healthy way, by EATING the fruit. It is important that you exercise regularly at least 30 minutes 5 times a week. If you develop chest pain, have severe difficulty breathing, or feel very tired, stop exercising immediately and seek medical attention  Thanks for choosing [facility], we consider it a privelige to serve you.

## 2019-12-04 NOTE — Progress Notes (Signed)
Virtual Visit via Telephone Note  I connected with Danny Gomez on 12/04/19 at 11:00 AM EST by telephone and verified that I am speaking with the correct person using two identifiers.  Location: Patient: home  Provider: office   I discussed the limitations, risks, security and privacy concerns of performing an evaluation and management service by telephone and the availability of in person appointments. I also discussed with the patient that there may be a patient responsible charge related to this service. The patient expressed understanding and agreed to proceed.   History of Present Illness: F/u chronic problems Recently has had increased neck pain which is responding to treatment by chiropractor Has changed lifestyle wih reported great weight loss since last visit, he is encouraged to continue same Denies recent fever or chills. Denies sinus pressure, nasal congestion, ear pain or sore throat. Denies chest congestion, productive cough or wheezing. Denies chest pains, palpitations and leg swelling Denies abdominal pain, nausea, vomiting,diarrhea or constipation.   Denies dysuria, frequency, hesitancy or incontinence.  Denies headaches, seizures, numbness, or tingling. Denies depression, anxiety or insomnia. Denies skin break down or rash.       Observations/Objective:  Ht 5\' 11"  (1.803 m)   Wt (!) 331 lb (150.1 kg)   BMI 46.17 kg/m  Patient reported Good communication with no confusion and intact memory. Alert and oriented x 3 No signs of respiratory distress during speech   Assessment and Plan:  Essential hypertension DASH diet and commitment to daily physical activity for a minimum of 30 minutes discussed and encouraged, as a part of hypertension management. The importance of attaining a healthy weight is also discussed.  BP/Weight 12/04/2019 10/17/2019 07/31/2019 01/24/2019 09/14/2018 09/05/2018 07/07/2018  Systolic BP - 134 134 161 130 07/09/2018 162  Diastolic BP - 82  82 84 88 66 83  Wt. (Lbs) 331 351 351.12 347.7 333 332.8 335  BMI 46.17 48.95 48.97 48.49 46.44 46.42 46.72   Has been controlled in the past, he is to continue current medication and come in for visit in 4 to 50months    Morbid obesity (HCC) Improved with lifestyle change, continue same  Patient re-educated about  the importance of commitment to a  minimum of 150 minutes of exercise per week as able.  The importance of healthy food choices with portion control discussed, as well as eating regularly and within a 12 hour window most days. The need to choose "clean , green" food 50 to 75% of the time is discussed, as well as to make water the primary drink and set a goal of 64 ounces water daily.    Weight /BMI 12/04/2019 10/17/2019 07/31/2019  WEIGHT 331 lb 351 lb 351 lb 1.9 oz  HEIGHT 5\' 11"  5\' 11"  5\' 11"   BMI 46.17 kg/m2 48.95 kg/m2 48.97 kg/m2      Neck pain Responding to treatment by Chiropractor reportedly, so he will continue same , he is also to get X ray C spine    Follow Up Instructions:    I discussed the assessment and treatment plan with the patient. The patient was provided an opportunity to ask questions and all were answered. The patient agreed with the plan and demonstrated an understanding of the instructions.   The patient was advised to call back or seek an in-person evaluation if the symptoms worsen or if the condition fails to improve as anticipated.  I provided 12 minutes of non-face-to-face time during this encounter.   10/01/2019, MD

## 2019-12-08 ENCOUNTER — Encounter: Payer: Self-pay | Admitting: Family Medicine

## 2019-12-08 DIAGNOSIS — M542 Cervicalgia: Secondary | ICD-10-CM | POA: Insufficient documentation

## 2019-12-08 NOTE — Assessment & Plan Note (Signed)
Improved with lifestyle change, continue same  Patient re-educated about  the importance of commitment to a  minimum of 150 minutes of exercise per week as able.  The importance of healthy food choices with portion control discussed, as well as eating regularly and within a 12 hour window most days. The need to choose "clean , green" food 50 to 75% of the time is discussed, as well as to make water the primary drink and set a goal of 64 ounces water daily.    Weight /BMI 12/04/2019 10/17/2019 07/31/2019  WEIGHT 331 lb 351 lb 351 lb 1.9 oz  HEIGHT 5\' 11"  5\' 11"  5\' 11"   BMI 46.17 kg/m2 48.95 kg/m2 48.97 kg/m2

## 2019-12-08 NOTE — Assessment & Plan Note (Signed)
DASH diet and commitment to daily physical activity for a minimum of 30 minutes discussed and encouraged, as a part of hypertension management. The importance of attaining a healthy weight is also discussed.  BP/Weight 12/04/2019 10/17/2019 07/31/2019 01/24/2019 09/14/2018 09/05/2018 07/07/2018  Systolic BP - 134 134 161 130 184 162  Diastolic BP - 82 82 84 88 66 83  Wt. (Lbs) 331 351 351.12 347.7 333 332.8 335  BMI 46.17 48.95 48.97 48.49 46.44 46.42 46.72   Has been controlled in the past, he is to continue current medication and come in for visit in 4 to 67months

## 2019-12-08 NOTE — Assessment & Plan Note (Addendum)
Responding to treatment by Chiropractor reportedly, so he will continue same , he is also to get X ray C spine

## 2019-12-28 ENCOUNTER — Other Ambulatory Visit: Payer: Self-pay | Admitting: Family Medicine

## 2019-12-28 ENCOUNTER — Other Ambulatory Visit: Payer: Self-pay

## 2019-12-28 MED ORDER — ALLOPURINOL 100 MG PO TABS
100.0000 mg | ORAL_TABLET | Freq: Every day | ORAL | 2 refills | Status: DC
Start: 2019-12-28 — End: 2019-12-28

## 2019-12-28 MED ORDER — ALLOPURINOL 100 MG PO TABS
100.0000 mg | ORAL_TABLET | Freq: Every day | ORAL | 2 refills | Status: DC
Start: 2019-12-28 — End: 2020-04-22

## 2020-01-22 ENCOUNTER — Telehealth: Payer: Self-pay

## 2020-01-22 NOTE — Telephone Encounter (Signed)
States he is covid positive. Wanted to know if he needed any rx sent in. Can offer phone visit with available provider if he wants

## 2020-01-23 ENCOUNTER — Other Ambulatory Visit: Payer: Self-pay

## 2020-01-23 ENCOUNTER — Encounter: Payer: Self-pay | Admitting: Nurse Practitioner

## 2020-01-23 ENCOUNTER — Telehealth (INDEPENDENT_AMBULATORY_CARE_PROVIDER_SITE_OTHER): Payer: BLUE CROSS/BLUE SHIELD | Admitting: Nurse Practitioner

## 2020-01-23 DIAGNOSIS — U071 COVID-19: Secondary | ICD-10-CM | POA: Insufficient documentation

## 2020-01-23 MED ORDER — HYDROCODONE-HOMATROPINE 5-1.5 MG/5ML PO SYRP
5.0000 mL | ORAL_SOLUTION | Freq: Four times a day (QID) | ORAL | 0 refills | Status: DC | PRN
Start: 2020-01-23 — End: 2020-03-26

## 2020-01-23 NOTE — Assessment & Plan Note (Signed)
-  several symptoms have resolved, but his cough is persistent -no SOB or wheezing -Rx. Hycodan syrup

## 2020-01-23 NOTE — Progress Notes (Signed)
Acute Office Visit  Subjective:    Patient ID: Danny Gomez, male    DOB: [birth year], 35 y.o.   MRN: [id number]  Chief Complaint  Patient presents with  . Covid Positive    X 1 week   . Cough    HPI Patient is in today for cough after testing positive for COVID.  He was experiencing diarrhea, fever, headache, and body aches. His symptoms have resolved except for the cough.  He tried taking nyquil and alka-seltzer, but his cough has not resolves.  Past Medical History:  Diagnosis Date  . Asthma    pt. reports as a child he used an inhaler , but asthma has resoled in the adult path  . Febrile illness   . Headache(784.0)   . Hypertension   . Neuromuscular disorder (HCC)    hnp- L4-5, L5-S1  . Obesity     Past Surgical History:  Procedure Laterality Date  . right ankle surgery  2002   bones had fused but it was corrected surgically  . TONSILLECTOMY      Family History  Problem Relation Age of Onset  . Hypertension Mother   . Obesity Mother     Social History   Socioeconomic History  . Marital status: Single    Spouse name: Not on file  . Number of children: 0  . Years of education: College gr  . Highest education level: Not on file  Occupational History  . Occupation: Employed   Tobacco Use  . Smoking status: Never Smoker  . Smokeless tobacco: Never Used  Substance and Sexual Activity  . Alcohol use: No    Comment: socially- monthly   . Drug use: Yes    Types: Marijuana    Comment: once per day   . Sexual activity: Not Currently  Other Topics Concern  . Not on file  Social History Narrative   Living with Grandparents    Social Determinants of Health   Financial Resource Strain: Not on file  Food Insecurity: Not on file  Transportation Needs: Not on file  Physical Activity: Not on file  Stress: Not on file  Social Connections: Not on file  Intimate Partner Violence: Not on file    Outpatient Medications Prior to Visit  Medication Sig  Dispense Refill  . acetaminophen (TYLENOL) 650 MG CR tablet Take 1,300 mg by mouth every 8 (eight) hours as needed for pain.    [name] Kitchen allopurinol (ZYLOPRIM) 100 MG tablet Take 1 tablet (100 mg total) by mouth daily. 30 tablet 2  . amLODipine (NORVASC) 10 MG tablet Take 1 tablet (10 mg total) by mouth daily. 90 tablet 3  . ergocalciferol (VITAMIN D2) 1.25 MG (50000 UT) capsule Take 1 capsule (50,000 Units total) by mouth once a week. One capsule once weekly 12 capsule 2  . lisinopril-hydrochlorothiazide (ZESTORETIC) 20-25 MG tablet Take 1 tablet by mouth daily. 90 tablet 3  . folic acid (FOLVITE) 400 MCG tablet Take 400 mcg by mouth daily. (Patient not taking: Reported on 01/23/2020)     No facility-administered medications prior to visit.    No Known Allergies  Review of Systems  Constitutional: Negative.   HENT: Negative for congestion, rhinorrhea, sinus pressure, sinus pain and sore throat.   Respiratory: Positive for cough. Negative for chest tightness, shortness of breath and wheezing.        Objective:    Physical Exam  There were no vitals taken for this visit. Wt Readings from Last 3 Encounters:  12/04/19 (!) 331 lb (150.1 kg)  10/17/19 (!) 351 lb (159.2 kg)  07/31/19 (!) 351 lb 1.9 oz (159.3 kg)    Health Maintenance Due  Topic Date Due  . INFLUENZA VACCINE  08/20/2019    There are no preventive care reminders to display for this patient.   Lab Results  Component Value Date   TSH 0.721 02/04/2015   Lab Results  Component Value Date   WBC 7.9 07/31/2019   HGB 15.8 07/31/2019   HCT 47.2 07/31/2019   MCV 86.6 07/31/2019   PLT 352 07/31/2019   Lab Results  Component Value Date   NA 137 07/31/2019   K 4.3 07/31/2019   CO2 27 07/31/2019   GLUCOSE 76 07/31/2019   BUN 15 07/31/2019   CREATININE 0.81 07/31/2019   BILITOT 0.5 07/31/2019   ALKPHOS 54 01/24/2019   AST 19 07/31/2019   ALT 20 07/31/2019   PROT 7.6 07/31/2019   ALBUMIN 4.1 01/24/2019   CALCIUM 9.8  07/31/2019   ANIONGAP 9 01/24/2019   Lab Results  Component Value Date   CHOL 145 07/31/2019   Lab Results  Component Value Date   HDL 24 (L) 07/31/2019   Lab Results  Component Value Date   LDLCALC 93 07/31/2019   Lab Results  Component Value Date   TRIG 185 (H) 07/31/2019   Lab Results  Component Value Date   CHOLHDL 6.0 (H) 07/31/2019   Lab Results  Component Value Date   HGBA1C 5.6 07/31/2019       Assessment & Plan:   Problem List Items Addressed This Visit      Other   COVID-19    -several symptoms have resolved, but his cough is persistent -no SOB or wheezing -Rx. Hycodan syrup          No orders of the defined types were placed in this encounter.  Date:  01/23/2020   Location of Patient: Home Location of Provider: Office Consent was obtain for visit to be over via telehealth. I verified that I am speaking with the correct person using two identifiers.  I connected with  Virgina Norfolk on 01/23/20 via telephone and verified that I am speaking with the correct person using two identifiers.   I discussed the limitations of evaluation and management by telemedicine. The patient expressed understanding and agreed to proceed.  Time spent: 8 minutes   Heather Roberts, NP

## 2020-03-15 ENCOUNTER — Ambulatory Visit: Payer: BLUE CROSS/BLUE SHIELD

## 2020-03-21 ENCOUNTER — Ambulatory Visit: Payer: BLUE CROSS/BLUE SHIELD | Admitting: Family Medicine

## 2020-03-22 ENCOUNTER — Ambulatory Visit: Payer: BLUE CROSS/BLUE SHIELD

## 2020-03-26 ENCOUNTER — Other Ambulatory Visit: Payer: Self-pay

## 2020-03-26 ENCOUNTER — Encounter: Payer: Self-pay | Admitting: Family Medicine

## 2020-03-26 ENCOUNTER — Telehealth: Payer: BLUE CROSS/BLUE SHIELD | Admitting: Family Medicine

## 2020-03-26 VITALS — Ht 71.0 in | Wt 320.0 lb

## 2020-03-26 DIAGNOSIS — I10 Essential (primary) hypertension: Secondary | ICD-10-CM

## 2020-03-26 DIAGNOSIS — E790 Hyperuricemia without signs of inflammatory arthritis and tophaceous disease: Secondary | ICD-10-CM

## 2020-03-26 MED ORDER — TRIAMTERENE-HCTZ 37.5-25 MG PO TABS: 1.0000 | ORAL_TABLET | Freq: Every day | ORAL | 1 refills | Status: DC

## 2020-03-26 NOTE — Progress Notes (Signed)
Virtual Visit via Telephone Note  I connected with Danny Gomez on 03/26/20 at  2:40 PM EST by telephone and verified that I am speaking with the correct person using two identifiers.  Location: Patient: home Provider: work   I discussed the limitations, risks, security and privacy concerns of performing an evaluation and management service by telephone and the availability of in person appointments. I also discussed with the patient that there may be a patient responsible charge related to this service. The patient expressed understanding and agreed to proceed.   History of Present Illness: 5 day h/o tickle in throat and dry cough, has in the past expressed concern re the possibility of ACE allergy. Has no symptoms of uncontrolled or allergy symptoms Still exercising regularly and following plant based diet with ongoing weight loss Denies recent fever or chills. Denies sinus pressure, nasal congestion, ear pain or sore throat. Denies chest congestion, productive cough or wheezing. Denies chest pains, palpitations and leg swelling Denies abdominal pain, nausea, vomiting,diarrhea or constipation.   Denies dysuria, frequency, hesitancy or incontinence. Denies joint pain, swelling and limitation in mobility. Denies headaches, seizures, numbness, or tingling. Denies depression, anxiety or insomnia. Denies skin break down or rash.       Observations/Objective: Ht 5\' 11"  (1.803 m)   Wt (!) 320 lb (145.2 kg)   BMI 44.63 kg/m  Good communication with no confusion and intact memory. Alert and oriented x 3 No signs of respiratory distress during speech    Assessment and Plan: Essential hypertension Controlled , but possible ACE aklergy, stop ACE and start triamterne, continue amlodipine DASH diet and commitment to daily physical activity for a minimum of 30 minutes discussed and encouraged, as a part of hypertension management. The importance of attaining a healthy weight is  also discussed.  BP/Weight 03/26/2020 12/04/2019 10/17/2019 07/31/2019 01/24/2019 09/14/2018 09/05/2018  Systolic BP - - 134 134 161 130 139  Diastolic BP - - 82 82 84 88 66  Wt. (Lbs) 320 331 351 351.12 347.7 333 332.8  BMI 44.63 46.17 48.95 48.97 48.49 46.44 46.42       Morbid obesity (HCC) Improving with lifestyle change, applauded on this and encouraged to continue same  Patient re-educated about  the importance of commitment to a  minimum of 150 minutes of exercise per week as able.  The importance of healthy food choices with portion control discussed, as well as eating regularly and within a 12 hour window most days. The need to choose "clean , green" food 50 to 75% of the time is discussed, as well as to make water the primary drink and set a goal of 64 ounces water daily.    Weight /BMI 03/26/2020 12/04/2019 10/17/2019  WEIGHT 320 lb 331 lb 351 lb  HEIGHT 5\' 11"  5\' 11"  5\' 11"   BMI 44.63 kg/m2 46.17 kg/m2 48.95 kg/m2        Follow Up Instructions:    I discussed the assessment and treatment plan with the patient. The patient was provided an opportunity to ask questions and all were answered. The patient agreed with the plan and demonstrated an understanding of the instructions.   The patient was advised to call back or seek an in-person evaluation if the symptoms worsen or if the condition fails to improve as anticipated.  I provided 14 minutes of non-face-to-face time during this encounter.   10/19/2019, MD

## 2020-03-26 NOTE — Patient Instructions (Addendum)
Office visit to re evaluate weight and blood pressure in 6 weeks, any available Provider, Monday morning appointment  New for blood pressure is triamterene 25 mg one daily, stop lisinopril/hctz Continue amlodipine one daily  Congrats on continued weight loss.  Keep up good health habits  Fasting lipid, cmp and EGFr and Uric acid level, [facility] please get 3 to 5 days before next visit

## 2020-03-30 NOTE — Assessment & Plan Note (Signed)
Improving with lifestyle change, applauded on this and encouraged to continue same  Patient re-educated about  the importance of commitment to a  minimum of 150 minutes of exercise per week as able.  The importance of healthy food choices with portion control discussed, as well as eating regularly and within a 12 hour window most days. The need to choose "clean , green" food 50 to 75% of the time is discussed, as well as to make water the primary drink and set a goal of 64 ounces water daily.    Weight /BMI 03/26/2020 12/04/2019 10/17/2019  WEIGHT 320 lb 331 lb 351 lb  HEIGHT 5\' 11"  5\' 11"  5\' 11"   BMI 44.63 kg/m2 46.17 kg/m2 48.95 kg/m2

## 2020-03-30 NOTE — Assessment & Plan Note (Signed)
Controlled , but possible ACE aklergy, stop ACE and start triamterne, continue amlodipine DASH diet and commitment to daily physical activity for a minimum of 30 minutes discussed and encouraged, as a part of hypertension management. The importance of attaining a healthy weight is also discussed.  BP/Weight 03/26/2020 12/04/2019 10/17/2019 07/31/2019 01/24/2019 09/14/2018 09/05/2018  Systolic BP - - 134 134 161 130 139  Diastolic BP - - 82 82 84 88 66  Wt. (Lbs) 320 331 351 351.12 347.7 333 332.8  BMI 44.63 46.17 48.95 48.97 48.49 46.44 46.42

## 2020-04-16 ENCOUNTER — Ambulatory Visit: Payer: BLUE CROSS/BLUE SHIELD | Admitting: Family Medicine

## 2020-04-19 ENCOUNTER — Other Ambulatory Visit: Payer: Self-pay | Admitting: Family Medicine

## 2020-05-07 ENCOUNTER — Ambulatory Visit: Payer: BLUE CROSS/BLUE SHIELD | Admitting: Nurse Practitioner

## 2020-05-13 ENCOUNTER — Encounter: Payer: Self-pay | Admitting: Family Medicine

## 2020-05-13 ENCOUNTER — Other Ambulatory Visit: Payer: Self-pay

## 2020-05-13 ENCOUNTER — Ambulatory Visit (HOSPITAL_COMMUNITY)
Admission: RE | Admit: 2020-05-13 | Discharge: 2020-05-13 | Disposition: A | Discharge status: Home / Self Care | Payer: BLUE CROSS/BLUE SHIELD | Source: Ambulatory Visit | Attending: Family Medicine | Admitting: Family Medicine

## 2020-05-13 ENCOUNTER — Ambulatory Visit: Payer: BLUE CROSS/BLUE SHIELD | Admitting: Family Medicine

## 2020-05-13 VITALS — BP 144/92 | HR 83 | Resp 15 | Ht 71.0 in | Wt 315.0 lb

## 2020-05-13 DIAGNOSIS — M542 Cervicalgia: Secondary | ICD-10-CM | POA: Diagnosis present

## 2020-05-13 DIAGNOSIS — I10 Essential (primary) hypertension: Secondary | ICD-10-CM | POA: Diagnosis not present

## 2020-05-13 DIAGNOSIS — L732 Hidradenitis suppurativa: Secondary | ICD-10-CM | POA: Insufficient documentation

## 2020-05-13 DIAGNOSIS — E559 Vitamin D deficiency, unspecified: Secondary | ICD-10-CM

## 2020-05-13 DIAGNOSIS — Z13220 Encounter for screening for lipoid disorders: Secondary | ICD-10-CM

## 2020-05-13 MED ORDER — DOXYCYCLINE HYCLATE 100 MG PO TABS: 100.0000 mg | ORAL_TABLET | Freq: Two times a day (BID) | ORAL | 0 refills | Status: DC

## 2020-05-13 MED ORDER — TRIAMTERENE-HCTZ 37.5-25 MG PO TABS: ORAL_TABLET | ORAL | 1 refills | Status: DC

## 2020-05-13 NOTE — Progress Notes (Signed)
   Danny Gomez     MRN: [id number]      DOB: 10/23/1985   HPI Danny Gomez is here for follow up and re-evaluation of chronic medical conditions, medication management and review of any available recent lab and radiology data.  Preventive health is updated, specifically  Cancer screening and Immunization.   Questions or concerns regarding consultations or procedures which the PT has had in the interim are  addressed. Recurrent skin infections and obesity, recent one on LL abdomen x 4 daysThere are no new concerns.  There are no specific complaints   ROS Denies recent fever or chills. Denies sinus pressure, nasal congestion, ear pain or sore throat. Denies chest congestion, productive cough or wheezing. Denies chest pains, palpitations and leg swelling Denies abdominal pain, nausea, vomiting,diarrhea or constipation.   Denies dysuria, frequency, hesitancy or incontinence. Denies joint pain, swelling and limitation in mobility. Denies headaches, seizures, numbness, or tingling. Denies depression, anxiety or insomnia.  PE BP (!) 144/92   Pulse 83   Resp 15   Ht 5\' 11"  (1.803 m)   Wt (!) 315 lb (142.9 kg)   SpO2 97%   BMI 43.93 kg/m     Patient alert and oriented and in no cardiopulmonary distress.  HEENT: No facial asymmetry, EOMI,     Neck supple .  Chest: Clear to auscultation bilaterally.  CVS: S1, S2 no murmurs, no S3.Regular rate.  ABD: Soft non tender.   Ext: No edema  MS: Adequate ROM spine, shoulders, hips and knees.  Skin: Intact, hyperpigmentation noted in areas of scarring, erythema of LL abdominal wall, no draige, skin broken Psych: Good eye contact, normal affect. Memory intact not anxious or depressed appearing.  CNS: CN 2-12 intact, power,  normal throughout.no focal deficits noted.   Assessment & Plan  Essential hypertension Uncontrolled , inc triamterene to 1.5 daily DASH diet and commitment to daily physical activity for a minimum of 30 minutes  discussed and encouraged, as a part of hypertension management. The importance of attaining a healthy weight is also discussed.  BP/Weight 05/13/2020 03/26/2020 12/04/2019 10/17/2019 07/31/2019 01/24/2019 09/14/2018  Systolic BP 144 - - 134 134 161 130  Diastolic BP 92 - - 82 82 84 88  Wt. (Lbs) 315 320 331 351 351.12 347.7 333  BMI 43.93 44.63 46.17 48.95 48.97 48.49 46.44       Morbid obesity (HCC)  Patient re-educated about  the importance of commitment to a  minimum of 150 minutes of exercise per week as able.  The importance of healthy food choices with portion control discussed, as well as eating regularly and within a 12 hour window most days. The need to choose "clean , green" food 50 to 75% of the time is discussed, as well as to make water the primary drink and set a goal of 64 ounces water daily.    Weight /BMI 05/13/2020 03/26/2020 12/04/2019  WEIGHT 315 lb 320 lb 331 lb  HEIGHT 5\' 11"  5\' 11"  5\' 11"   BMI 43.93 kg/m2 44.63 kg/m2 46.17 kg/m2      Neck pain Left neck pain with tingling in left thumb , worse x 4 months, x ray c spine  Hydradenitis Recurrent skin infections primarily in abdomen and arms, current boil present, 1 wek abitoic course , doxy

## 2020-05-13 NOTE — Assessment & Plan Note (Signed)
Left neck pain with tingling in left thumb , worse x 4 months, x ray c spine

## 2020-05-13 NOTE — Assessment & Plan Note (Signed)
Uncontrolled , inc triamterene to 1.5 daily DASH diet and commitment to daily physical activity for a minimum of 30 minutes discussed and encouraged, as a part of hypertension management. The importance of attaining a healthy weight is also discussed.  BP/Weight 05/13/2020 03/26/2020 12/04/2019 10/17/2019 07/31/2019 01/24/2019 09/14/2018  Systolic BP 144 - - 134 134 161 130  Diastolic BP 92 - - 82 82 84 88  Wt. (Lbs) 315 320 331 351 351.12 347.7 333  BMI 43.93 44.63 46.17 48.95 48.97 48.49 46.44

## 2020-05-13 NOTE — Assessment & Plan Note (Signed)
  Patient re-educated about  the importance of commitment to a  minimum of 150 minutes of exercise per week as able.  The importance of healthy food choices with portion control discussed, as well as eating regularly and within a 12 hour window most days. The need to choose "clean , green" food 50 to 75% of the time is discussed, as well as to make water the primary drink and set a goal of 64 ounces water daily.    Weight /BMI 05/13/2020 03/26/2020 12/04/2019  WEIGHT 315 lb 320 lb 331 lb  HEIGHT 5\' 11"  5\' 11"  5\' 11"   BMI 43.93 kg/m2 44.63 kg/m2 46.17 kg/m2

## 2020-05-13 NOTE — Patient Instructions (Signed)
F/u in 8 weeks, call if you need me sooner, re eval weight and bP Antibiotic prescribed for skin infection  New higher dose of triamterene, 1.5 tablets daily for blood pressure  Please get X ray of your neck today  You will be given 1800 and 1500 cal diet sheets to follow, need to count calories  Keep up the great work!  Labs today, cBC, lipid, chem 7 and EGFr, tSH, Vit D  Thanks for choosing [facility], we consider it a privelige to serve you.

## 2020-05-13 NOTE — Addendum Note (Signed)
Addended by: Abner Greenspan on: 05/13/2020 10:04 AM   Modules accepted: Orders

## 2020-05-13 NOTE — Assessment & Plan Note (Signed)
Recurrent skin infections primarily in abdomen and arms, current boil present, 1 wek abitoic course , doxy

## 2020-05-14 ENCOUNTER — Other Ambulatory Visit: Payer: Self-pay | Admitting: Family Medicine

## 2020-05-14 LAB — LIPID PANEL
Chol/HDL Ratio: 4.5 ratio (ref 0.0–5.0)
Cholesterol, Total: 167 mg/dL (ref 100–199)
HDL: 37 mg/dL — ABNORMAL LOW (ref >39)
LDL Chol Calc (NIH): 107 mg/dL — ABNORMAL HIGH (ref 0–99)
Triglycerides: 128 mg/dL (ref 0–149)
VLDL Cholesterol Cal: 23 mg/dL (ref 5–40)

## 2020-05-14 LAB — BMP8+EGFR
BUN/Creatinine Ratio: 16 (ref 9–20)
BUN: 16 mg/dL (ref 6–20)
CO2: 23 mmol/L (ref 20–29)
Calcium: 10 mg/dL (ref 8.7–10.2)
Chloride: 99 mmol/L (ref 96–106)
Creatinine, Ser: 1.02 mg/dL (ref 0.76–1.27)
Glucose: 83 mg/dL (ref 65–99)
Potassium: 4.5 mmol/L (ref 3.5–5.2)
Sodium: 139 mmol/L (ref 134–144)
eGFR: 99 mL/min/{1.73_m2} (ref >59)

## 2020-05-14 LAB — VITAMIN D 25 HYDROXY (VIT D DEFICIENCY, FRACTURES): Vit D, 25-Hydroxy: 12.1 ng/mL — ABNORMAL LOW (ref 30.0–100.0)

## 2020-05-14 LAB — CBC
Hematocrit: 49.8 % (ref 37.5–51.0)
Hemoglobin: 16.7 g/dL (ref 13.0–17.7)
MCH: 29.4 pg (ref 26.6–33.0)
MCHC: 33.5 g/dL (ref 31.5–35.7)
MCV: 88 fL (ref 79–97)
Platelets: 354 10*3/uL (ref 150–450)
RBC: 5.68 x10E6/uL (ref 4.14–5.80)
RDW: 13.7 % (ref 11.6–15.4)
WBC: 10 10*3/uL (ref 3.4–10.8)

## 2020-05-14 LAB — TSH: TSH: 1.66 u[IU]/mL (ref 0.450–4.500)

## 2020-05-14 MED ORDER — ERGOCALCIFEROL 1.25 MG (50000 UT) PO CAPS: 50000.0000 [IU] | ORAL_CAPSULE | ORAL | 2 refills | Status: DC

## 2020-06-05 ENCOUNTER — Other Ambulatory Visit: Payer: Self-pay | Admitting: Family Medicine

## 2020-07-08 ENCOUNTER — Other Ambulatory Visit: Payer: Self-pay | Admitting: Family Medicine

## 2020-07-15 ENCOUNTER — Other Ambulatory Visit: Payer: Self-pay

## 2020-07-15 ENCOUNTER — Encounter: Payer: Self-pay | Admitting: Family Medicine

## 2020-07-15 ENCOUNTER — Ambulatory Visit: Payer: BLUE CROSS/BLUE SHIELD | Admitting: Family Medicine

## 2020-07-15 VITALS — BP 128/78 | HR 84 | Temp 98.0°F | Resp 20 | Ht 71.0 in | Wt 306.0 lb

## 2020-07-15 DIAGNOSIS — R110 Nausea: Secondary | ICD-10-CM | POA: Insufficient documentation

## 2020-07-15 DIAGNOSIS — R1013 Epigastric pain: Secondary | ICD-10-CM | POA: Diagnosis not present

## 2020-07-15 DIAGNOSIS — R140 Abdominal distension (gaseous): Secondary | ICD-10-CM | POA: Insufficient documentation

## 2020-07-15 DIAGNOSIS — I10 Essential (primary) hypertension: Secondary | ICD-10-CM

## 2020-07-15 NOTE — Patient Instructions (Addendum)
F?U in 4 months, call if you need me sooner  GREAT weight loss and BP, keep it up  Weight loss goal of 2 pounds/ week is healthy, so goal of 32 pounds in 4 months  Breath test today  You are referred for  [place] of your gall bladder  It is important that you exercise regularly at least 30 minutes 5 times a week. If you develop chest pain, have severe difficulty breathing, or feel very tired, stop exercising immediately and seek medical attention  Think about what you will eat, plan ahead. Choose " clean, green, fresh or frozen" over canned, processed or packaged foods which are more sugary, salty and fatty. 70 to 75% of food eaten should be vegetables and fruit. Three meals at set times with snacks allowed between meals, but they must be fruit or vegetables. Aim to eat over a 12 hour period , example 7 am to 7 pm, and STOP after  your last meal of the day. Drink water,generally about 64 ounces per day, no other drink is as healthy. Fruit juice is best enjoyed in a healthy way, by EATING the fruit. Thanks for choosing [facility], we consider it a privelige to serve you.

## 2020-07-17 LAB — H. PYLORI BREATH TEST: H pylori Breath Test: NEGATIVE

## 2020-07-17 MED ORDER — PANTOPRAZOLE SODIUM 20 MG PO TBEC: 20.0000 mg | DELAYED_RELEASE_TABLET | Freq: Every day | ORAL | 0 refills | Status: DC

## 2020-07-17 NOTE — Progress Notes (Signed)
   Danny Gomez     MRN: [id number]      DOB: April 21, 1985   HPI Mr. Danny Gomez is here for follow up and re-evaluation of chronic medical conditions, medication management and review of any available recent lab and radiology data.  Preventive health is updated, specifically  Cancer screening and Immunization.   Questions or concerns regarding consultations or procedures which the PT has had in the interim are  addressed. The PT denies any adverse reactions to current medications since the last visit.  There are no new concerns.  There are no specific complaints   ROS Denies recent fever or chills. Denies sinus pressure, nasal congestion, ear pain or sore throat. Denies chest congestion, productive cough or wheezing. Denies chest pains, palpitations and leg swelling C/o nausea and early satiety with bloating, no change in BM, no vomit   Denies dysuria, frequency, hesitancy or incontinence. Denies joint pain, swelling and limitation in mobility. Denies headaches, seizures, numbness, or tingling. Denies depression, anxiety or insomnia. Denies skin break down or rash.   PE  BP 128/78   Pulse 84   Temp 98 F (36.7 C)   Resp 20   Ht 5\' 11"  (1.803 m)   Wt (!) 306 lb (138.8 kg)   SpO2 97%   BMI 42.68 kg/m   Patient alert and oriented and in no cardiopulmonary distress.  HEENT: No facial asymmetry, EOMI,     Neck supple .  Chest: Clear to auscultation bilaterally.  CVS: S1, S2 no murmurs, no S3.Regular rate.  ABD: Soft non tender.   Ext: No edema  MS: Adequate ROM spine, shoulders, hips and knees.  Skin: Intact, no ulcerations or rash noted.  Psych: Good eye contact, normal affect. Memory intact not anxious or depressed appearing.  CNS: CN 2-12 intact, power,  normal throughout.no focal deficits noted.   Assessment & Plan Morbid obesity (HCC) Excellent ongoing weight loss, applauded on this and is to continue, doing this with lifestyle change only  Patient  re-educated about  the importance of commitment to a  minimum of 150 minutes of exercise per week as able.  The importance of healthy food choices with portion control discussed, as well as eating regularly and within a 12 hour window most days. The need to choose "clean , green" food 50 to 75% of the time is discussed, as well as to make water the primary drink and set a goal of 64 ounces water daily.    Weight /BMI 07/15/2020 05/13/2020 03/26/2020  WEIGHT 306 lb 315 lb 320 lb  HEIGHT 5\' 11"  5\' 11"  5\' 11"   BMI 42.68 kg/m2 43.93 kg/m2 44.63 kg/m2      Nausea Nausea, bloating dyspepsia, check H pylori, then trial of PPI and 05/26/2020 of gall bladder  Essential hypertension Controlled, no change in medication DASH diet and commitment to daily physical activity for a minimum of 30 minutes discussed and encouraged, as a part of hypertension management. The importance of attaining a healthy weight is also discussed.  BP/Weight 07/15/2020 05/13/2020 03/26/2020 12/04/2019 10/17/2019 07/31/2019 01/24/2019  Systolic BP 128 144 - - 134 134 161  Diastolic BP 78 92 - - 82 82 84  Wt. (Lbs) 306 315 320 331 351 351.12 347.7  BMI 42.68 43.93 44.63 46.17 48.95 48.97 48.49

## 2020-07-18 ENCOUNTER — Encounter: Payer: Self-pay | Admitting: Family Medicine

## 2020-07-18 NOTE — Assessment & Plan Note (Signed)
Nausea, bloating dyspepsia, check H pylori, then trial of PPI and [place] of gall bladder

## 2020-07-18 NOTE — Assessment & Plan Note (Signed)
Controlled, no change in medication DASH diet and commitment to daily physical activity for a minimum of 30 minutes discussed and encouraged, as a part of hypertension management. The importance of attaining a healthy weight is also discussed.  BP/Weight 07/15/2020 05/13/2020 03/26/2020 12/04/2019 10/17/2019 07/31/2019 01/24/2019  Systolic BP 128 144 - - 134 134 161  Diastolic BP 78 92 - - 82 82 84  Wt. (Lbs) 306 315 320 331 351 351.12 347.7  BMI 42.68 43.93 44.63 46.17 48.95 48.97 48.49

## 2020-07-18 NOTE — Assessment & Plan Note (Signed)
Excellent ongoing weight loss, applauded on this and is to continue, doing this with lifestyle change only  Patient re-educated about  the importance of commitment to a  minimum of 150 minutes of exercise per week as able.  The importance of healthy food choices with portion control discussed, as well as eating regularly and within a 12 hour window most days. The need to choose "clean , green" food 50 to 75% of the time is discussed, as well as to make water the primary drink and set a goal of 64 ounces water daily.    Weight /BMI 07/15/2020 05/13/2020 03/26/2020  WEIGHT 306 lb 315 lb 320 lb  HEIGHT 5\' 11"  5\' 11"  5\' 11"   BMI 42.68 kg/m2 43.93 kg/m2 44.63 kg/m2

## 2020-07-24 ENCOUNTER — Ambulatory Visit (HOSPITAL_COMMUNITY): Admission: RE | Admit: 2020-07-24 | Payer: BLUE CROSS/BLUE SHIELD | Source: Ambulatory Visit

## 2020-08-05 ENCOUNTER — Ambulatory Visit (HOSPITAL_COMMUNITY): Payer: BLUE CROSS/BLUE SHIELD

## 2020-08-12 ENCOUNTER — Other Ambulatory Visit: Payer: Self-pay | Admitting: Family Medicine

## 2020-09-19 ENCOUNTER — Other Ambulatory Visit: Payer: Self-pay | Admitting: Family Medicine

## 2020-11-04 ENCOUNTER — Other Ambulatory Visit: Payer: Self-pay | Admitting: Family Medicine

## 2020-11-18 ENCOUNTER — Ambulatory Visit: Payer: BLUE CROSS/BLUE SHIELD | Admitting: Family Medicine

## 2020-11-22 ENCOUNTER — Ambulatory Visit: Payer: BLUE CROSS/BLUE SHIELD | Admitting: Family Medicine

## 2020-12-04 ENCOUNTER — Telehealth: Payer: Self-pay | Admitting: Family Medicine

## 2020-12-04 NOTE — Telephone Encounter (Signed)
Pt called in and wants to switch all med refill over to   [facility]  on [address] , [place], [place]

## 2020-12-05 ENCOUNTER — Other Ambulatory Visit: Payer: Self-pay

## 2020-12-05 MED ORDER — TRIAMTERENE-HCTZ 37.5-25 MG PO TABS: ORAL_TABLET | ORAL | 1 refills | Status: DC

## 2020-12-05 MED ORDER — AMLODIPINE BESYLATE 10 MG PO TABS: 10.0000 mg | ORAL_TABLET | Freq: Every day | ORAL | 1 refills | Status: DC

## 2020-12-05 MED ORDER — ALLOPURINOL 100 MG PO TABS: 100.0000 mg | ORAL_TABLET | Freq: Every day | ORAL | 5 refills | Status: DC

## 2021-01-01 ENCOUNTER — Other Ambulatory Visit: Payer: Self-pay | Admitting: Family Medicine

## 2021-01-01 DIAGNOSIS — F321 Major depressive disorder, single episode, moderate: Secondary | ICD-10-CM

## 2021-01-03 ENCOUNTER — Telehealth: Payer: Self-pay | Admitting: Family Medicine

## 2021-01-03 NOTE — Telephone Encounter (Signed)
Pt returning Dr. Otis Dials

## 2021-01-08 NOTE — Telephone Encounter (Signed)
I don't see anything in his chart, not sure if u called him or not

## 2021-01-08 NOTE — Telephone Encounter (Signed)
noted 

## 2021-01-08 NOTE — Telephone Encounter (Signed)
Needs appt with simpson in 2-6 weeks and make sure he knows about televisit with Joni Reining on 12/27

## 2021-01-08 NOTE — Telephone Encounter (Signed)
Called patient and patient said will call us back to schedule a follow up when he checks his schedule

## 2021-01-14 ENCOUNTER — Ambulatory Visit (INDEPENDENT_AMBULATORY_CARE_PROVIDER_SITE_OTHER): Payer: Self-pay | Admitting: Licensed Clinical Social Worker

## 2021-01-14 ENCOUNTER — Other Ambulatory Visit: Payer: Self-pay

## 2021-01-14 DIAGNOSIS — F321 Major depressive disorder, single episode, moderate: Secondary | ICD-10-CM

## 2021-01-20 ENCOUNTER — Telehealth: Payer: Self-pay | Admitting: Family Medicine

## 2021-01-20 NOTE — Telephone Encounter (Signed)
ERROR

## 2021-01-21 ENCOUNTER — Ambulatory Visit (INDEPENDENT_AMBULATORY_CARE_PROVIDER_SITE_OTHER): Payer: Self-pay | Admitting: Licensed Clinical Social Worker

## 2021-01-21 ENCOUNTER — Other Ambulatory Visit: Payer: Self-pay

## 2021-01-21 DIAGNOSIS — F321 Major depressive disorder, single episode, moderate: Secondary | ICD-10-CM

## 2021-01-21 NOTE — Progress Notes (Signed)
See note

## 2021-01-21 NOTE — BH Specialist Note (Signed)
[place] Initial TeleMedicine Clinical Assessment  MRN: [id number] NAME: Danny Gomez Date: 01/21/21  Start time: 9a End time: 915a Total time: 15  Types of Service: Telephone visit Referring Provider: Dr. Moshe Cipro Reason for Visit today: begin VBH service  Patient/Family location: home [place] Provider location: home office All persons participating in visit: yes  I connected with patient and/or family via Telephone or Video Enabled Telemedicine Application  (Video is Caregility application) and verified that I am speaking with the correct person using two identifiers.   Discussed confidentiality: Yes   I discussed the limitations of telemedicine and the availability of in person appointments.  Discussed there is a possibility of technology failure and discussed alternative modes of communication if that failure occurs.  I discussed that engaging in this telemedicine visit, they consent to the provision of behavioral healthcare and the services will be billed under their insurance.  Patient and/or legal guardian expressed understanding and consented to Telemedicine visit: Yes   Treatment History Patient recently received Inpatient Treatment: No  Patient currently being seen by therapist/psychiatrist: No  Patient currently receiving the following services: depression  Past Psychiatric History/Diagnosis/Hospitalization(s): Anxiety: No Bipolar Disorder: No Depression: Yes Mania: No Psychosis: No Schizophrenia: No Personality Disorder: No Hospitalization for psychiatric illness: No History of Electroconvulsive Shock Therapy: No Prior Suicide Attempts: No  Decreased need for sleep: No  Euphoria: No  Self Injurious behaviors: No  Family History of mental illness: Yes  Family History of substance abuse: No  Substance Abuse: No  DUI: No  Insomnia: No   History of violence: No  Physical, sexual or emotional abuse: No  Prior outpatient mental  health therapy: No   Clinical Assessment PHQ9 SCORE ONLY 01/21/2021 07/15/2020 01/23/2020  PHQ-9 Total Score 15 0 0       Social Functioning Social maturity: WNL Social judgement: WNL  Stress Current stressors: minimal Familial stressors: denies Sleep: fair Appetite: poor Coping ability: overwhelmed Patient taking medications as prescribed:    Current medications:  Outpatient Encounter Medications as of 01/14/2021  Medication Sig   acetaminophen (TYLENOL) 650 MG CR tablet Take 1,300 mg by mouth every 8 (eight) hours as needed for pain.   allopurinol (ZYLOPRIM) 100 MG tablet Take 1 tablet (100 mg total) by mouth daily.   amLODipine (NORVASC) 10 MG tablet Take 1 tablet (10 mg total) by mouth daily.   doxycycline (VIBRA-TABS) 100 MG tablet Take 1 tablet (100 mg total) by mouth 2 (two) times daily.   ergocalciferol (VITAMIN D2) 1.25 MG (50000 UT) capsule Take 1 capsule (50,000 Units total) by mouth once a week. One capsule once weekly   folic acid (FOLVITE) A999333 MCG tablet Take 400 mcg by mouth daily.   pantoprazole (PROTONIX) 20 MG tablet Take 1 tablet (20 mg total) by mouth daily.   triamterene-hydrochlorothiazide (MAXZIDE-25) 37.5-25 MG tablet Take one and a half tablets by mou once daily for blood pressure   No facility-administered encounter medications on file as of 01/14/2021.     Self-harm and/or Suicidal Behaviors Risk Assessment Self-harm risk factors: no Patient endorses recent self injurious thoughts and/or behaviors: No  Suicide ideations: No plan to harm self or others   Danger to Others Risk Assessment Danger to others risk factors: no Patient endorses recent thoughts of harming others: No    Substance Use Assessment Patient recently consumed alcohol: No  Patient recently used drugs: No  Patient is concerned about dependence or abuse of substances: No    Goals,  Interventions and Follow-up Plan Goals: Increase healthy adjustment to current life  circumstances Interventions: Behavioral Activation and CBT Cognitive Behavioral Therapy Follow-up Plan:  weekly VBH services  Summary of Clinical Assessment Summary: Danny Gomez is a 36 yr old male who presents for [facility] services.  He reports that he has not been himself lately. He attributes some of his symptoms to COVID and some to symptoms of his depression.  He reports sadness, lack of motivation, fatigue, increase appetite.  He currently has support of family and friends.  LCSW discussed what psychotherapy is and is not and the importance of the therapeutic relationship to include open and honest communication between client and therapist and building trust.  Reviewed advantages and disadvantages of the therapeutic process and limitations to the therapeutic relationship including LCSW's role in maintaining the safety of the client, others and those in client's care.    Lubertha South, LCSW

## 2021-01-24 NOTE — Progress Notes (Signed)
See BH note °

## 2021-01-24 NOTE — BH Specialist Note (Signed)
[place] [facility] Follow Up Assessment  MRN: [id number] NAME: Danny Gomez Date: 01/24/21  Start time: 1030 End time: 1050 Total time: 20  Type of Contact: Follow up Call  Current concerns/stressors: symptoms  Screens/Assessment Tools:  Will complete PHQ9 & GAD7 at next session; Patient was in community  Functional Assessment:  Sleep: fair Appetite: fair Coping ability: fair Patient taking medications as prescribed:    Current medications:  Outpatient Encounter Medications as of 01/21/2021  Medication Sig   acetaminophen (TYLENOL) 650 MG CR tablet Take 1,300 mg by mouth every 8 (eight) hours as needed for pain.   allopurinol (ZYLOPRIM) 100 MG tablet Take 1 tablet (100 mg total) by mouth daily.   amLODipine (NORVASC) 10 MG tablet Take 1 tablet (10 mg total) by mouth daily.   doxycycline (VIBRA-TABS) 100 MG tablet Take 1 tablet (100 mg total) by mouth 2 (two) times daily.   ergocalciferol (VITAMIN D2) 1.25 MG (50000 UT) capsule Take 1 capsule (50,000 Units total) by mouth once a week. One capsule once weekly   folic acid (FOLVITE) 400 MCG tablet Take 400 mcg by mouth daily.   pantoprazole (PROTONIX) 20 MG tablet Take 1 tablet (20 mg total) by mouth daily.   triamterene-hydrochlorothiazide (MAXZIDE-25) 37.5-25 MG tablet Take one and a half tablets by mou once daily for blood pressure   No facility-administered encounter medications on file as of 01/21/2021.    Self-harm and/or Suicidal Behaviors Risk Assessment Self-harm risk factors: no Patient endorses recent self injurious thoughts and/or behaviors: No   Suicide ideations: No plan to harm self or others   Danger to Others Risk Assessment Danger to others risk factors: no Patient endorses recent thoughts of harming others: No    Substance Use Assessment Patient recently consumed alcohol: No  Patient recently used drugs: No  Patient is concerned about dependence or abuse of substances: No    Goals,  Interventions and Follow-up Plan Goals: Increase healthy adjustment to current life circumstances Interventions: Solution-Focused Strategies, Mindfulness or Relaxation Training, Behavioral Activation, and CBT Cognitive Behavioral Therapy   Summary of Clinical Assessment  Danny Gomez was present for his follow up session.  He was open and engaged in session.  He continues to report sadness and need for emotion regulation. Factors that contribute to client's ongoing depressive symptoms were discussed and include real and perceived feelings of isolation, criticism, rejection, shame and guilt.    Follow-up Plan:  weekly VBH session Marinda Elk, LCSW

## 2021-01-28 ENCOUNTER — Encounter: Payer: Self-pay | Admitting: Licensed Clinical Social Worker

## 2021-01-28 ENCOUNTER — Telehealth (INDEPENDENT_AMBULATORY_CARE_PROVIDER_SITE_OTHER): Payer: Self-pay | Admitting: Licensed Clinical Social Worker

## 2021-01-28 ENCOUNTER — Other Ambulatory Visit: Payer: Self-pay

## 2021-01-28 DIAGNOSIS — F321 Major depressive disorder, single episode, moderate: Secondary | ICD-10-CM
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
## 2021-01-29 ENCOUNTER — Telehealth (INDEPENDENT_AMBULATORY_CARE_PROVIDER_SITE_OTHER): Payer: Self-pay | Admitting: Licensed Clinical Social Worker

## 2021-01-29 DIAGNOSIS — F321 Major depressive disorder, single episode, moderate: Secondary | ICD-10-CM
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
## 2021-01-29 NOTE — BH Specialist Note (Signed)
[facility] Treatment Plan Team Note  MRN: [id number] NAME: Danny Gomez  DATE: 02/03/21  Start time:   215p End time:  220p Total time:  5 min  Total number of Virtual [place] Treatment Team Plan encounters: 1/4  Treatment Team Attendees: Royal Piedra, LCSW & Dr. Modesta Messing  Diagnoses:    ICD-10-CM   1. Depression, major, single episode, moderate (HCC)  F32.1       Goals, Interventions and Follow-up Plan Goals: Increase healthy adjustment to current life circumstances Interventions: Solution-Focused Strategies Mindfulness or Relaxation Training CBT Cognitive Behavioral Therapy Medication Management Recommendations: n/a;  he prefers to try therapy first Follow-up Plan: Weekly VBH Sessions  History of the present illness Presenting Problem/Current Symptoms: symptoms of depression  Psychiatric History  Depression: No Anxiety: No Mania: No Psychosis: No PTSD symptoms: No  Past Psychiatric History/Hospitalization(s): Hospitalization for psychiatric illness: No Prior Suicide Attempts: No Prior Self-injurious behavior: No  Psychosocial stressors  relationships Self-harm Behaviors Risk Assessment   Screenings PHQ-9 Assessments:  Depression screen [facility] 2/9 01/28/2021 01/21/2021 07/15/2020  Decreased Interest 1 2 0  Down, Depressed, Hopeless 2 3 0  PHQ - 2 Score 3 5 0  Altered sleeping 2 2 -  Tired, decreased energy 1 2 -  Change in appetite 2 2 -  Feeling bad or failure about yourself  2 1 -  Trouble concentrating 1 2 -  Moving slowly or fidgety/restless 1 1 -  Suicidal thoughts 0 0 -  PHQ-9 Score 12 15 -  Difficult doing work/chores Somewhat difficult Somewhat difficult -   GAD-7 Assessments:  GAD 7 : Generalized Anxiety Score 01/28/2021  Nervous, Anxious, on Edge 0  Control/stop worrying 2  Worry too much - different things 2  Trouble relaxing 1  Restless 0  Easily annoyed or irritable 0  Afraid - awful might happen 0  Total GAD 7 Score 5   Anxiety Difficulty Not difficult at all    Past Medical History Past Medical History:  Diagnosis Date   Asthma    pt. reports as a child he used an inhaler , but asthma has resoled in the adult path   Febrile illness    Headache(784.0)    Hypertension    Neuromuscular disorder ([place])    hnp- L4-5, L5-S1   Obesity     Vital signs: There were no vitals filed for this visit.  Allergies:  Allergies as of 01/29/2021 - Review Complete 07/18/2020  Allergen Reaction Noted   Lisinopril Cough 03/26/2020    Medication History Current medications:  Outpatient Encounter Medications as of 01/29/2021  Medication Sig   acetaminophen (TYLENOL) 650 MG CR tablet Take 1,300 mg by mouth every 8 (eight) hours as needed for pain.   allopurinol (ZYLOPRIM) 100 MG tablet Take 1 tablet (100 mg total) by mouth daily.   amLODipine (NORVASC) 10 MG tablet Take 1 tablet (10 mg total) by mouth daily.   doxycycline (VIBRA-TABS) 100 MG tablet Take 1 tablet (100 mg total) by mouth 2 (two) times daily.   ergocalciferol (VITAMIN D2) 1.25 MG (50000 UT) capsule Take 1 capsule (50,000 Units total) by mouth once a week. One capsule once weekly   folic acid (FOLVITE) A999333 MCG tablet Take 400 mcg by mouth daily.   pantoprazole (PROTONIX) 20 MG tablet Take 1 tablet (20 mg total) by mouth daily.   triamterene-hydrochlorothiazide (MAXZIDE-25) 37.5-25 MG tablet Take one and a half tablets by mou once daily for blood pressure   No facility-administered encounter medications  on file as of 01/29/2021.     Scribe for Treatment Team: Lubertha South, LCSW

## 2021-02-19 ENCOUNTER — Telehealth (INDEPENDENT_AMBULATORY_CARE_PROVIDER_SITE_OTHER): Payer: Self-pay | Admitting: Licensed Clinical Social Worker

## 2021-02-19 DIAGNOSIS — F321 Major depressive disorder, single episode, moderate: Secondary | ICD-10-CM

## 2021-02-19 NOTE — BH Specialist Note (Signed)
 [facility] Follow Up Assessment  MRN: [id number] NAME: Danny Gomez Date: 02/19/21  Start time: 1010a End time: 1050 Total time: 40   Type of Contact: Follow up Call  Current concerns/stressors: depressive mood  Screens/Assessment Tools:  Unable to provide at this time Functional Assessment:  Sleep: fair Appetite: fair Coping ability: overwhelmed Patient taking medications as prescribed:  n/a  Current medications:  Outpatient Encounter Medications as of 02/19/2021  Medication Sig   acetaminophen (TYLENOL) 650 MG CR tablet Take 1,300 mg by mouth every 8 (eight) hours as needed for pain.   allopurinol (ZYLOPRIM) 100 MG tablet Take 1 tablet (100 mg total) by mouth daily.   amLODipine (NORVASC) 10 MG tablet Take 1 tablet (10 mg total) by mouth daily.   doxycycline (VIBRA-TABS) 100 MG tablet Take 1 tablet (100 mg total) by mouth 2 (two) times daily.   ergocalciferol (VITAMIN D2) 1.25 MG (50000 UT) capsule Take 1 capsule (50,000 Units total) by mouth once a week. One capsule once weekly   folic acid (FOLVITE) 400 MCG tablet Take 400 mcg by mouth daily.   pantoprazole (PROTONIX) 20 MG tablet Take 1 tablet (20 mg total) by mouth daily.   triamterene-hydrochlorothiazide (MAXZIDE-25) 37.5-25 MG tablet Take one and a half tablets by mou once daily for blood pressure   No facility-administered encounter medications on file as of 02/19/2021.    Self-harm and/or Suicidal Behaviors Risk Assessment Self-harm risk factors: no Patient endorses recent self injurious thoughts and/or behaviors: No   Suicide ideations: No plan to harm self or others   Danger to Others Risk Assessment Danger to others risk factors: no Patient endorses recent thoughts of harming others: No    Substance Use Assessment Patient recently consumed alcohol: No  Patient recently used drugs: No  Patient is concerned about dependence or abuse of substances: No    Goals, Interventions and Follow-up  Plan Goals: Increase healthy adjustment to current life circumstances Interventions: Mindfulness or Relaxation Training and CBT Cognitive Behavioral Therapy   Summary of Clinical Assessment  Danny Gomez is a 36 yr old male that was present for his session.  He continues to have good and bad days.  He currently uses coping skills appropriately and is making good progress on his goals.  He continues to explore "stinking thinking" in session.  He is not on medication and currently has no desire for medication.  He is currently managing his mood/behavior well.    Follow-up Plan:  weekly; next visit  February 6 at 10a Marinda Elk, LCSW

## 2021-02-19 NOTE — BH Specialist Note (Signed)
[facility] Treatment Plan Team Note  MRN: [id number] NAME: Danny Gomez  DATE: 02/19/21  Start time:  61p End time:  330p Total time:  10 min  Total number of Virtual [place] Treatment Team Plan encounters: 2/4  Treatment Team Attendees: Dr. Modesta Messing & Royal Piedra, LCSW  Diagnoses: No diagnosis found.  Goals, Interventions and Follow-up Plan Goals: Increase healthy adjustment to current life circumstances Interventions: Mindfulness or Relaxation Training CBT Cognitive Behavioral Therapy Medication Management Recommendations: n/a Follow-up Plan: weekly; next visit  History of the present illness Presenting Problem/Current Symptoms: depressive symptoms  Psychiatric History  Depression: No Anxiety: No Mania: No Psychosis: No PTSD symptoms: No  Past Psychiatric History/Hospitalization(s): Hospitalization for psychiatric illness: No Prior Suicide Attempts: No Prior Self-injurious behavior: No  Psychosocial stressors relationships  Self-harm Behaviors Risk Assessment   Screenings PHQ-9 Assessments:  Depression screen [facility] 2/9 01/28/2021 01/21/2021 07/15/2020  Decreased Interest 1 2 0  Down, Depressed, Hopeless 2 3 0  PHQ - 2 Score 3 5 0  Altered sleeping 2 2 -  Tired, decreased energy 1 2 -  Change in appetite 2 2 -  Feeling bad or failure about yourself  2 1 -  Trouble concentrating 1 2 -  Moving slowly or fidgety/restless 1 1 -  Suicidal thoughts 0 0 -  PHQ-9 Score 12 15 -  Difficult doing work/chores Somewhat difficult Somewhat difficult -   GAD-7 Assessments:  GAD 7 : Generalized Anxiety Score 01/28/2021  Nervous, Anxious, on Edge 0  Control/stop worrying 2  Worry too much - different things 2  Trouble relaxing 1  Restless 0  Easily annoyed or irritable 0  Afraid - awful might happen 0  Total GAD 7 Score 5  Anxiety Difficulty Not difficult at all    Past Medical History Past Medical History:  Diagnosis Date   Asthma    pt. reports as  a child he used an inhaler , but asthma has resoled in the adult path   Febrile illness    Headache(784.0)    Hypertension    Neuromuscular disorder ([place])    hnp- L4-5, L5-S1   Obesity     Vital signs: There were no vitals filed for this visit.  Allergies:  Allergies as of 02/19/2021 - Review Complete 07/18/2020  Allergen Reaction Noted   Lisinopril Cough 03/26/2020    Medication History Current medications:  Outpatient Encounter Medications as of 02/19/2021  Medication Sig   acetaminophen (TYLENOL) 650 MG CR tablet Take 1,300 mg by mouth every 8 (eight) hours as needed for pain.   allopurinol (ZYLOPRIM) 100 MG tablet Take 1 tablet (100 mg total) by mouth daily.   amLODipine (NORVASC) 10 MG tablet Take 1 tablet (10 mg total) by mouth daily.   doxycycline (VIBRA-TABS) 100 MG tablet Take 1 tablet (100 mg total) by mouth 2 (two) times daily.   ergocalciferol (VITAMIN D2) 1.25 MG (50000 UT) capsule Take 1 capsule (50,000 Units total) by mouth once a week. One capsule once weekly   folic acid (FOLVITE) A999333 MCG tablet Take 400 mcg by mouth daily.   pantoprazole (PROTONIX) 20 MG tablet Take 1 tablet (20 mg total) by mouth daily.   triamterene-hydrochlorothiazide (MAXZIDE-25) 37.5-25 MG tablet Take one and a half tablets by mou once daily for blood pressure   No facility-administered encounter medications on file as of 02/19/2021.     Scribe for Treatment Team: Lubertha South, LCSW

## 2021-02-20 NOTE — BH Specialist Note (Signed)
rescheduled

## 2021-02-26 ENCOUNTER — Telehealth (INDEPENDENT_AMBULATORY_CARE_PROVIDER_SITE_OTHER): Payer: Self-pay | Admitting: Licensed Clinical Social Worker

## 2021-02-26 ENCOUNTER — Telehealth: Payer: Self-pay | Admitting: Licensed Clinical Social Worker

## 2021-02-26 DIAGNOSIS — F321 Major depressive disorder, single episode, moderate: Secondary | ICD-10-CM

## 2021-02-26 NOTE — BH Specialist Note (Signed)
Virtual Behavioral Health Treatment Plan Team Note  MRN: [id number] NAME: Danny Gomez  DATE: 02/26/21  Start time: 4p  End time:  410p Total time:  10 min  Total number of Virtual [place] Treatment Team Plan encounters: 3/4  Treatment Team Attendees: Royal Piedra, LCSW & Dr. Modesta Messing  Diagnoses: No diagnosis found.  Goals, Interventions and Follow-up Plan Goals: Increase healthy adjustment to current life circumstances Interventions: Solution-Focused Strategies Mindfulness or Relaxation Training Medication Management Recommendations: n/a Follow-up Plan: weekly follow up  History of the present illness Presenting Problem/Current Symptoms: continued symptoms of diagnosis  Psychiatric History  Depression: No Anxiety: No Mania: No Psychosis: No PTSD symptoms: No  Past Psychiatric History/Hospitalization(s): Hospitalization for psychiatric illness: No Prior Suicide Attempts: No Prior Self-injurious behavior: No  Psychosocial stressors relationship  Self-harm Behaviors Risk Assessment no  Screenings PHQ-9 Assessments:  Depression screen [facility] 2/9 02/26/2021 01/28/2021 01/21/2021  Decreased Interest 2 1 2   Down, Depressed, Hopeless 2 2 3   PHQ - 2 Score 4 3 5   Altered sleeping 1 2 2   Tired, decreased energy 3 1 2   Change in appetite 3 2 2   Feeling bad or failure about yourself  3 2 1   Trouble concentrating 0 1 2  Moving slowly or fidgety/restless 0 1 1  Suicidal thoughts 0 0 0  PHQ-9 Score 14 12 15   Difficult doing work/chores Somewhat difficult Somewhat difficult Somewhat difficult   GAD-7 Assessments:  GAD 7 : Generalized Anxiety Score 02/26/2021 01/28/2021  Nervous, Anxious, on Edge 1 0  Control/stop worrying 0 2  Worry too much - different things 1 2  Trouble relaxing 1 1  Restless 0 0  Easily annoyed or irritable 0 0  Afraid - awful might happen 0 0  Total GAD 7 Score 3 5  Anxiety Difficulty Not difficult at all Not difficult at all    Past Medical  History Past Medical History:  Diagnosis Date   Asthma    pt. reports as a child he used an inhaler , but asthma has resoled in the adult path   Febrile illness    Headache(784.0)    Hypertension    Neuromuscular disorder ([place])    hnp- L4-5, L5-S1   Obesity     Vital signs: There were no vitals filed for this visit.  Allergies:  Allergies as of 02/26/2021 - Review Complete 07/18/2020  Allergen Reaction Noted   Lisinopril Cough 03/26/2020    Medication History Current medications:  Outpatient Encounter Medications as of 02/26/2021  Medication Sig   acetaminophen (TYLENOL) 650 MG CR tablet Take 1,300 mg by mouth every 8 (eight) hours as needed for pain.   allopurinol (ZYLOPRIM) 100 MG tablet Take 1 tablet (100 mg total) by mouth daily.   amLODipine (NORVASC) 10 MG tablet Take 1 tablet (10 mg total) by mouth daily.   doxycycline (VIBRA-TABS) 100 MG tablet Take 1 tablet (100 mg total) by mouth 2 (two) times daily.   ergocalciferol (VITAMIN D2) 1.25 MG (50000 UT) capsule Take 1 capsule (50,000 Units total) by mouth once a week. One capsule once weekly   folic acid (FOLVITE) [id number] MCG tablet Take 400 mcg by mouth daily.   pantoprazole (PROTONIX) 20 MG tablet Take 1 tablet (20 mg total) by mouth daily.   triamterene-hydrochlorothiazide (MAXZIDE-25) 37.5-25 MG tablet Take one and a half tablets by mou once daily for blood pressure   No facility-administered encounter medications on file as of 02/26/2021.     Scribe for Treatment Team: Elmyra Ricks  Clemetine Marker, LCSW

## 2021-02-26 NOTE — BH Specialist Note (Signed)
[place] [facility] Follow Up Assessment  MRN: [id number] NAME: BYRL LATIN Date: 02/26/21  Start time: 1015 End time: 1130 Total time:  75 min  Type of Contact: Follow up Call  Current concerns/stressors: anxiousness  Screens/Assessment Tools:  PHQ-9 & GAD-7 Assessments: GAD 7 : Generalized Anxiety Score 02/26/2021 01/28/2021  Nervous, Anxious, on Edge 1 0  Control/stop worrying 0 2  Worry too much - different things 1 2  Trouble relaxing 1 1  Restless 0 0  Easily annoyed or irritable 0 0  Afraid - awful might happen 0 0  Total GAD 7 Score 3 5  Anxiety Difficulty Not difficult at all Not difficult at all     Depression screen [facility] 2/9 02/26/2021 01/28/2021 01/21/2021 07/15/2020 01/23/2020  Decreased Interest 2 1 2  0 0  Down, Depressed, Hopeless 2 2 3  0 0  PHQ - 2 Score 4 3 5  0 0  Altered sleeping 1 2 2  - -  Tired, decreased energy 3 1 2  - -  Change in appetite 3 2 2  - -  Feeling bad or failure about yourself  3 2 1  - -  Trouble concentrating 0 1 2 - -  Moving slowly or fidgety/restless 0 1 1 - -  Suicidal thoughts 0 0 0 - -  PHQ-9 Score 14 12 15  - -  Difficult doing work/chores Somewhat difficult Somewhat difficult Somewhat difficult - -     Functional Assessment:  Sleep: fair Appetite: fair Coping ability: fair Patient taking medications as prescribed:  yes  Current medications:  Outpatient Encounter Medications as of 02/26/2021  Medication Sig   acetaminophen (TYLENOL) 650 MG CR tablet Take 1,300 mg by mouth every 8 (eight) hours as needed for pain.   allopurinol (ZYLOPRIM) 100 MG tablet Take 1 tablet (100 mg total) by mouth daily.   amLODipine (NORVASC) 10 MG tablet Take 1 tablet (10 mg total) by mouth daily.   doxycycline (VIBRA-TABS) 100 MG tablet Take 1 tablet (100 mg total) by mouth 2 (two) times daily.   ergocalciferol (VITAMIN D2) 1.25 MG (50000 UT) capsule Take 1 capsule (50,000 Units total) by mouth once a week. One capsule once weekly   folic acid  (FOLVITE) 400 MCG tablet Take 400 mcg by mouth daily.   pantoprazole (PROTONIX) 20 MG tablet Take 1 tablet (20 mg total) by mouth daily.   triamterene-hydrochlorothiazide (MAXZIDE-25) 37.5-25 MG tablet Take one and a half tablets by mou once daily for blood pressure   No facility-administered encounter medications on file as of 02/26/2021.    Self-harm and/or Suicidal Behaviors Risk Assessment Self-harm risk factors: no Patient endorses recent self injurious thoughts and/or behaviors: No   Suicide ideations: No plan to harm self or others   Danger to Others Risk Assessment Danger to others risk factors: no Patient endorses recent thoughts of harming others: No    Substance Use Assessment Patient recently consumed alcohol: No  Patient recently used drugs: No  Patient is concerned about dependence or abuse of substances: No    Goals, Interventions and Follow-up Plan Goals: Increase healthy adjustment to current life circumstances Interventions: Solution-Focused Strategies and Mindfulness or Relaxation Training   Summary of Clinical Assessment  Lacy was present for his session.  He reports that he had a good weekend but is currently is having poor mood due to conversation that he had with roommate. Discussion of boundaries and coping skills that are helpful to reduce symptomology. Client discussed examples of situations where there are difficulties  with boundaries and boundary setting and self-assertion.    Follow-up Plan:  weekly follow up Marinda Elk, LCSW

## 2021-03-01 ENCOUNTER — Other Ambulatory Visit: Payer: Self-pay | Admitting: Family Medicine

## 2021-03-05 ENCOUNTER — Ambulatory Visit: Payer: Self-pay | Admitting: Family Medicine

## 2021-03-25 ENCOUNTER — Ambulatory Visit (INDEPENDENT_AMBULATORY_CARE_PROVIDER_SITE_OTHER): Payer: Managed Care, Other (non HMO) | Admitting: Family Medicine

## 2021-03-25 ENCOUNTER — Other Ambulatory Visit: Payer: Self-pay

## 2021-03-25 ENCOUNTER — Encounter: Payer: Self-pay | Admitting: Family Medicine

## 2021-03-25 VITALS — BP 130/82 | HR 84 | Ht 71.0 in | Wt 285.0 lb

## 2021-03-25 DIAGNOSIS — E559 Vitamin D deficiency, unspecified: Secondary | ICD-10-CM

## 2021-03-25 DIAGNOSIS — F321 Major depressive disorder, single episode, moderate: Secondary | ICD-10-CM

## 2021-03-25 DIAGNOSIS — M542 Cervicalgia: Secondary | ICD-10-CM

## 2021-03-25 DIAGNOSIS — Z13220 Encounter for screening for lipoid disorders: Secondary | ICD-10-CM

## 2021-03-25 DIAGNOSIS — R7302 Impaired glucose tolerance (oral): Secondary | ICD-10-CM

## 2021-03-25 DIAGNOSIS — F411 Generalized anxiety disorder: Secondary | ICD-10-CM | POA: Insufficient documentation

## 2021-03-25 DIAGNOSIS — I10 Essential (primary) hypertension: Secondary | ICD-10-CM

## 2021-03-25 DIAGNOSIS — Z23 Encounter for immunization: Secondary | ICD-10-CM

## 2021-03-25 MED ORDER — CITALOPRAM HYDROBROMIDE 10 MG PO TABS: 10.0000 mg | ORAL_TABLET | Freq: Every day | ORAL | 3 refills | Status: DC

## 2021-03-25 MED ORDER — GABAPENTIN 100 MG PO CAPS: 100.0000 mg | ORAL_CAPSULE | Freq: Three times a day (TID) | ORAL | 3 refills | Status: DC

## 2021-03-25 MED ORDER — AMLODIPINE BESYLATE 10 MG PO TABS: 10.0000 mg | ORAL_TABLET | Freq: Every day | ORAL | 3 refills | Status: DC

## 2021-03-25 MED ORDER — PREDNISONE 10 MG PO TABS: 10.0000 mg | ORAL_TABLET | Freq: Two times a day (BID) | ORAL | 0 refills | Status: DC

## 2021-03-25 MED ORDER — TRIAMTERENE-HCTZ 37.5-25 MG PO TABS: 1.0000 | ORAL_TABLET | Freq: Every day | ORAL | 3 refills | Status: DC

## 2021-03-25 NOTE — Patient Instructions (Addendum)
F/U in 10 weeks, call if you need me sooner ? ?Labs today, CBC, lipid, cmp and EGFR, tSH and vit D ? ?Pls administer TdAP and flu vaccine if he agrees, I did not address ? ?Mew citalopram 10 mg daily for anxiety ? ?New prednisone and gabapentin for neck pain ? ? ? ? ? ? ?

## 2021-03-25 NOTE — Progress Notes (Signed)
? ?  Danny Gomez     MRN: [id number]      DOB: 04/20/1985 ? ? ?HPI ?Danny Gomez is here for follow up and re-evaluation of chronic medical conditions, medication management and review of any available recent lab and radiology data.  ?Preventive health is updated, specifically  Cancer screening and Immunization.  Repots doing well with therapist ?Marked fluctuation in mood depending on what is going on in his personal life. Reports good support from friends and family ?His career is developing well ?He is doing very well as far as controlling diet and weight los, less exercise in recent times ?C/o left neck pain radiating to hand causing thumb to be numb ? ?ROS ?Denies recent fever or chills. ?Denies sinus pressure, nasal congestion, ear pain or sore throat. ?Denies chest congestion, productive cough or wheezing. ?Denies chest pains, palpitations and leg swelling ?Denies abdominal pain, nausea, vomiting,diarrhea or constipation.   ?Denies dysuria, frequency, hesitancy or incontinence. ?Denies skin break down or rash. ? ? ?PE ?BP 130/82   Pulse 84   Ht 5\' 11"  (1.803 m)   Wt 285 lb 0.6 oz (129.3 kg)   SpO2 98%   BMI 39.76 kg/m?  ? ? ?Patient alert and oriented and in no cardiopulmonary distress. ? ?HEENT: No facial asymmetry, EOMI,     Neck decreased ROM with left spasm . ? ?Chest: Clear to auscultation bilaterally. ? ?CVS: S1, S2 no murmurs, no S3.Regular rate. ? ?ABD: Soft non tender.  ? ?Ext: No edema ? ?MS: Adequate ROM spine, shoulders, hips and knees. ? ?Skin: Intact, no ulcerations or rash noted. ? ?Psych: Good eye contact, normal affect. Memory intact not anxious or depressed appearing. ? ?CNS: CN 2-12 intact, power,  normal throughout.no focal deficits noted. ? ? ?Assessment & Plan ? ?GAD (generalized anxiety disorder) ?Start lexapro daily, re eval in 10 weeks, continue therapy ? ?Depression, major, single episode, moderate ([place]) ?Nort suicidal or homicidal ?Noted to have an elevated score whhen recently  checked by therapist , start lexapro ? ?Neck pain on left side ?Anti nflammtory course and gabapentin long term ? ?Morbid obesity ([place]) ?Marked improvement ? ?Patient re-educated about  the importance of commitment to a  minimum of 150 minutes of exercise per week as able. ? ?The importance of healthy food choices with portion control discussed, as well as eating regularly and within a 12 hour window most days. ?The need to choose "clean , green" food 50 to 75% of the time is discussed, as well as to make water the primary drink and set a goal of 64 ounces water daily. ? ?  ?Weight /BMI 03/25/2021 07/15/2020 05/13/2020  ?WEIGHT 285 lb 0.6 oz 306 lb 315 lb  ?HEIGHT 5\' 11"  5\' 11"  5\' 11"   ?BMI 39.76 kg/m2 42.68 kg/m2 43.93 kg/m2  ? ? ? ? ?Essential hypertension ?DASH diet and commitment to daily physical activity for a minimum of 30 minutes discussed and encouraged, as a part of hypertension management. ?The importance of attaining a healthy weight is also discussed. ? ?BP/Weight 03/25/2021 07/15/2020 05/13/2020 03/26/2020 12/04/2019 10/17/2019 07/31/2019  ?Systolic BP [id number] 0000000 [id number] - - 134 134  ?Diastolic BP 82 78 92 - - 82 82  ?Wt. (Lbs) 285.04 306 315 320 331 351 351.12  ?BMI 39.76 42.68 43.93 44.63 46.17 48.95 48.97  ? ? ? ?Med prescribed ? ? ?

## 2021-03-26 LAB — CMP14+EGFR
ALT: 24 IU/L (ref 0–44)
AST: 23 IU/L (ref 0–40)
Albumin/Globulin Ratio: 1.5 (ref 1.2–2.2)
Albumin: 4.9 g/dL (ref 4.0–5.0)
Alkaline Phosphatase: 75 IU/L (ref 44–121)
BUN/Creatinine Ratio: 12 (ref 9–20)
BUN: 13 mg/dL (ref 6–20)
Bilirubin Total: 0.8 mg/dL (ref 0.0–1.2)
CO2: 23 mmol/L (ref 20–29)
Calcium: 10.4 mg/dL — ABNORMAL HIGH (ref 8.7–10.2)
Chloride: 97 mmol/L (ref 96–106)
Creatinine, Ser: 1.06 mg/dL (ref 0.76–1.27)
Globulin, Total: 3.3 g/dL (ref 1.5–4.5)
Glucose: 84 mg/dL (ref 70–99)
Potassium: 4 mmol/L (ref 3.5–5.2)
Sodium: 137 mmol/L (ref 134–144)
Total Protein: 8.2 g/dL (ref 6.0–8.5)
eGFR: 94 mL/min/{1.73_m2} (ref >59)

## 2021-03-26 LAB — CBC
Hematocrit: 50.2 % (ref 37.5–51.0)
Hemoglobin: 17.4 g/dL (ref 13.0–17.7)
MCH: 30.4 pg (ref 26.6–33.0)
MCHC: 34.7 g/dL (ref 31.5–35.7)
MCV: 88 fL (ref 79–97)
Platelets: 378 10*3/uL (ref 150–450)
RBC: 5.73 x10E6/uL (ref 4.14–5.80)
RDW: 13 % (ref 11.6–15.4)
WBC: 10.6 10*3/uL (ref 3.4–10.8)

## 2021-03-26 LAB — LIPID PANEL
Chol/HDL Ratio: 5.4 ratio — ABNORMAL HIGH (ref 0.0–5.0)
Cholesterol, Total: 172 mg/dL (ref 100–199)
HDL: 32 mg/dL — ABNORMAL LOW (ref >39)
LDL Chol Calc (NIH): 105 mg/dL — ABNORMAL HIGH (ref 0–99)
Triglycerides: 202 mg/dL — ABNORMAL HIGH (ref 0–149)
VLDL Cholesterol Cal: 35 mg/dL (ref 5–40)

## 2021-03-26 LAB — TSH: TSH: 1.36 u[IU]/mL (ref 0.450–4.500)

## 2021-03-26 LAB — VITAMIN D 25 HYDROXY (VIT D DEFICIENCY, FRACTURES): Vit D, 25-Hydroxy: 18.1 ng/mL — ABNORMAL LOW (ref 30.0–100.0)

## 2021-03-26 MED ORDER — ERGOCALCIFEROL 1.25 MG (50000 UT) PO CAPS: 50000.0000 [IU] | ORAL_CAPSULE | ORAL | 3 refills | Status: DC

## 2021-03-30 DIAGNOSIS — F321 Major depressive disorder, single episode, moderate: Secondary | ICD-10-CM | POA: Insufficient documentation

## 2021-03-30 NOTE — Assessment & Plan Note (Signed)
DASH diet and commitment to daily physical activity for a minimum of 30 minutes discussed and encouraged, as a part of hypertension management. ?The importance of attaining a healthy weight is also discussed. ? ?BP/Weight 03/25/2021 07/15/2020 05/13/2020 03/26/2020 12/04/2019 10/17/2019 07/31/2019  ?Systolic BP 130 128 144 - - 134 134  ?Diastolic BP 82 78 92 - - 82 82  ?Wt. (Lbs) 285.04 306 315 320 331 351 351.12  ?BMI 39.76 42.68 43.93 44.63 46.17 48.95 48.97  ? ? ? ?Med prescribed ?

## 2021-03-30 NOTE — Assessment & Plan Note (Signed)
Marked improvement ? ?Patient re-educated about  the importance of commitment to a  minimum of 150 minutes of exercise per week as able. ? ?The importance of healthy food choices with portion control discussed, as well as eating regularly and within a 12 hour window most days. ?The need to choose "clean , green" food 50 to 75% of the time is discussed, as well as to make water the primary drink and set a goal of 64 ounces water daily. ? ?  ?Weight /BMI 03/25/2021 07/15/2020 05/13/2020  ?WEIGHT 285 lb 0.6 oz 306 lb 315 lb  ?HEIGHT 5\' 11"  5\' 11"  5\' 11"   ?BMI 39.76 kg/m2 42.68 kg/m2 43.93 kg/m2  ? ? ? ?

## 2021-03-30 NOTE — Assessment & Plan Note (Signed)
Start lexapro daily, re eval in 10 weeks, continue therapy ?

## 2021-03-30 NOTE — Assessment & Plan Note (Signed)
Anti nflammtory course and gabapentin long term ?

## 2021-03-30 NOTE — Assessment & Plan Note (Signed)
Nort suicidal or homicidal ?Noted to have an elevated score whhen recently checked by therapist , start lexapro ?

## 2021-06-03 ENCOUNTER — Ambulatory Visit: Payer: Managed Care, Other (non HMO) | Admitting: Family Medicine

## 2021-07-04 ENCOUNTER — Telehealth: Payer: Self-pay | Admitting: Family Medicine

## 2021-07-04 ENCOUNTER — Ambulatory Visit (INDEPENDENT_AMBULATORY_CARE_PROVIDER_SITE_OTHER): Payer: Commercial Managed Care - HMO | Admitting: Family Medicine

## 2021-07-04 ENCOUNTER — Encounter: Payer: Self-pay | Admitting: Family Medicine

## 2021-07-04 DIAGNOSIS — U071 COVID-19: Secondary | ICD-10-CM | POA: Diagnosis not present

## 2021-07-04 DIAGNOSIS — R059 Cough, unspecified: Secondary | ICD-10-CM | POA: Diagnosis not present

## 2021-07-04 MED ORDER — BENZONATATE 100 MG PO CAPS: 100.0000 mg | ORAL_CAPSULE | Freq: Two times a day (BID) | ORAL | 0 refills | Status: DC | PRN

## 2021-07-04 MED ORDER — METHYLPREDNISOLONE 4 MG PO TBPK: ORAL_TABLET | ORAL | 0 refills | Status: DC

## 2021-07-04 MED ORDER — NIRMATRELVIR/RITONAVIR (PAXLOVID)TABLET: 3.0000 | ORAL_TABLET | Freq: Two times a day (BID) | ORAL | 0 refills | Status: DC

## 2021-07-04 MED ORDER — MOLNUPIRAVIR EUA 200MG CAPSULE: 4.0000 | ORAL_CAPSULE | Freq: Two times a day (BID) | ORAL | 0 refills | Status: DC

## 2021-07-04 NOTE — Telephone Encounter (Signed)
Please advice  

## 2021-07-04 NOTE — Telephone Encounter (Signed)
Patient called in covid +  Wants to know what he can take for it. Wants a call back.

## 2021-07-04 NOTE — Telephone Encounter (Signed)
This will require a virtual visit with available provider. Danny Gomez is not feeling well and we're not adding anymore to her

## 2021-07-04 NOTE — Telephone Encounter (Signed)
[facility] called in on patient behalf in regard to   molnupiravir EUA (LAGEVRIO) 200 mg CAPS capsule    Pharm is out of stock of med and cannot order said med.  Can send in alternative.

## 2021-07-04 NOTE — Telephone Encounter (Signed)
Please advise the patient to pick up paxlovid at his pharmacy

## 2021-07-04 NOTE — Progress Notes (Unsigned)
Virtual Visit via Telephone Note   This visit type was conducted due to national recommendations for restrictions regarding the COVID-19 Pandemic (e.g. social distancing) in an effort to limit this patient's exposure and mitigate transmission in our community.  Due to his co-morbid illnesses, this patient is at least at moderate risk for complications without adequate follow up.  This format is felt to be most appropriate for this patient at this time.  The patient did not have access to video technology/had technical difficulties with video requiring transitioning to audio format only (telephone).  All issues noted in this document were discussed and addressed.  No physical exam could be performed with this format.  Please refer to the patient's chart for his  consent to telehealth for [facility].   Evaluation Performed:  Follow-up visit  Date:  07/04/2021   ID:  Danny Gomez, DOB 11-27-1985, MRN [id number]  Patient Location: Home Provider Location: Office/Clinic  Participants: Patient Location of Patient: Home Location of Provider: Telehealth Consent was obtain for visit to be over via telehealth. I verified that I am speaking with the correct person using two identifiers.  PCP:  Kerri Perches, MD   Chief Complaint:  Covid 19 symptoms  History of Present Illness:     Danny Gomez is a 36 y.o. male with c/o of nasal congestion, headaches, mild cough, fatigue, and mild body aches. No fever or chills were reported. He tested positive for COVID 19 on 07/03/21. He has taken Mucinex and Zyrtec OTC. No fever, chills, or SOB was reported.    The patient does have symptoms concerning for COVID-19 infection (fever, chills, cough, or new shortness of breath).   Past Medical, Surgical, Social History, Allergies, and Medications have been Reviewed.  Past Medical History:  Diagnosis Date   Asthma    pt. reports as a child he used an inhaler , but asthma has resoled in the  adult path   Febrile illness    Headache(784.0)    Hypertension    Neuromuscular disorder ([facility])    hnp- L4-5, L5-S1   Obesity    Past Surgical History:  Procedure Laterality Date   right ankle surgery  2002   bones had fused but it was corrected surgically   TONSILLECTOMY       Current Meds  Medication Sig   acetaminophen (TYLENOL) 650 MG CR tablet Take 1,300 mg by mouth every 8 (eight) hours as needed for pain.   allopurinol (ZYLOPRIM) 100 MG tablet Take 1 tablet (100 mg total) by mouth daily.   amLODipine (NORVASC) 10 MG tablet Take 1 tablet (10 mg total) by mouth daily.   citalopram (CELEXA) 10 MG tablet Take 1 tablet (10 mg total) by mouth daily.   ergocalciferol (VITAMIN D2) 1.25 MG (50000 UT) capsule Take 1 capsule (50,000 Units total) by mouth once a week. One capsule once weekly   folic acid (FOLVITE) 400 MCG tablet Take 400 mcg by mouth daily.   gabapentin (NEURONTIN) 100 MG capsule Take 1 capsule (100 mg total) by mouth 3 (three) times daily.   predniSONE (DELTASONE) 10 MG tablet Take 1 tablet (10 mg total) by mouth 2 (two) times daily with a meal.   triamterene-hydrochlorothiazide (MAXZIDE-25) 37.5-25 MG tablet Take 1 tablet by mouth daily.     Allergies:   Lisinopril   ROS:   Please see the history of present illness.     All other systems reviewed and are negative.   Labs/Other  Tests and Data Reviewed:    Recent Labs: 03/25/2021: ALT 24; BUN 13; Creatinine, Ser 1.06; Hemoglobin 17.4; Platelets 378; Potassium 4.0; Sodium 137; TSH 1.360   Recent Lipid Panel Lab Results  Component Value Date/Time   CHOL 172 03/25/2021 09:51 AM   TRIG 202 (H) 03/25/2021 09:51 AM   HDL 32 (L) 03/25/2021 09:51 AM   CHOLHDL 5.4 (H) 03/25/2021 09:51 AM   CHOLHDL 6.0 (H) 07/31/2019 12:45 PM   LDLCALC 105 (H) 03/25/2021 09:51 AM   LDLCALC 93 07/31/2019 12:45 PM    Wt Readings from Last 3 Encounters:  03/25/21 285 lb 0.6 oz (129.3 kg)  07/15/20 (!) 306 lb (138.8 kg)  05/13/20  (!) 315 lb (142.9 kg)     Objective:    Vital Signs:  There were no vitals taken for this visit.     ASSESSMENT & PLAN:    Positive Covid 19 -paxlovid was ordered, but pt reported not  feeling comfortable taking treatments due to the side effects of facial twitching -Molnupiravir was ordered, but pt reported medication was out of stock at his pharmacy - will order a short course of steroid treatment and tessalon for cough Recommended to continue using muscinex and analgesics for body aches and headaches    Time:   Today, I have spent 11 minutes reviewing the chart, including problem list, medications, and with the patient with telehealth technology discussing the above problems.   Medication Adjustments/Labs and Tests Ordered: Current medicines are reviewed at length with the patient today.  Concerns regarding medicines are outlined above.   Tests Ordered: No orders of the defined types were placed in this encounter.   Medication Changes: No orders of the defined types were placed in this encounter.    Note: This dictation was prepared with Dragon dictation along with smaller phrase technology. Similar sounding words can be transcribed inadequately or may not be corrected upon review. Any transcriptional errors that result from this process are unintentional.      Disposition:  Follow up  Signed, Gilmore Laroche, FNP  07/04/2021 2:30 PM     [facility]

## 2021-07-04 NOTE — Telephone Encounter (Signed)
APPT SCHEDULED AND AWARE

## 2021-07-04 NOTE — Telephone Encounter (Signed)
Pt wanted rx changed.  [name] has called in meds and patient is aware.  He will pick up at Pharmacy.

## 2021-08-04 ENCOUNTER — Other Ambulatory Visit: Payer: Self-pay | Admitting: Family Medicine

## 2021-10-09 ENCOUNTER — Other Ambulatory Visit: Payer: Self-pay | Admitting: Family Medicine

## 2021-10-28 IMAGING — DX DG CERVICAL SPINE COMPLETE 4+V
6 series · 6 of 6 positions shown · non-contrast
Comparison: None.

CLINICAL DATA: Neck pain,

EXAM:
CERVICAL SPINE - COMPLETE 4+ VIEW

[c-spine lat]
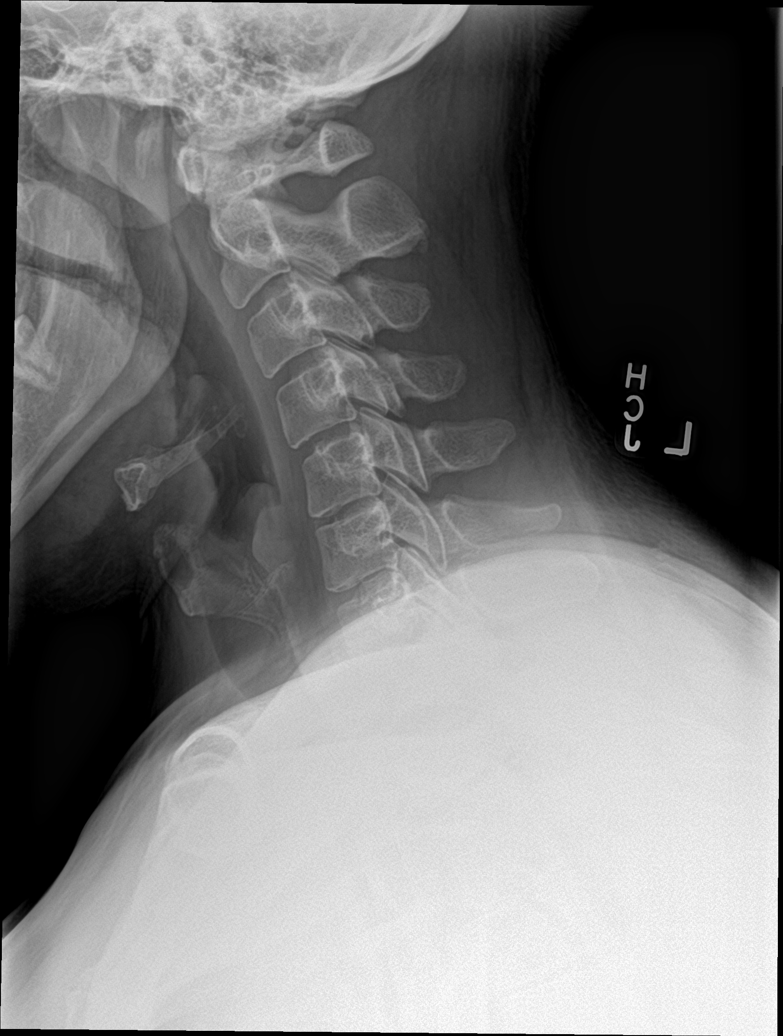

[c-spine obl (1 of 2)]
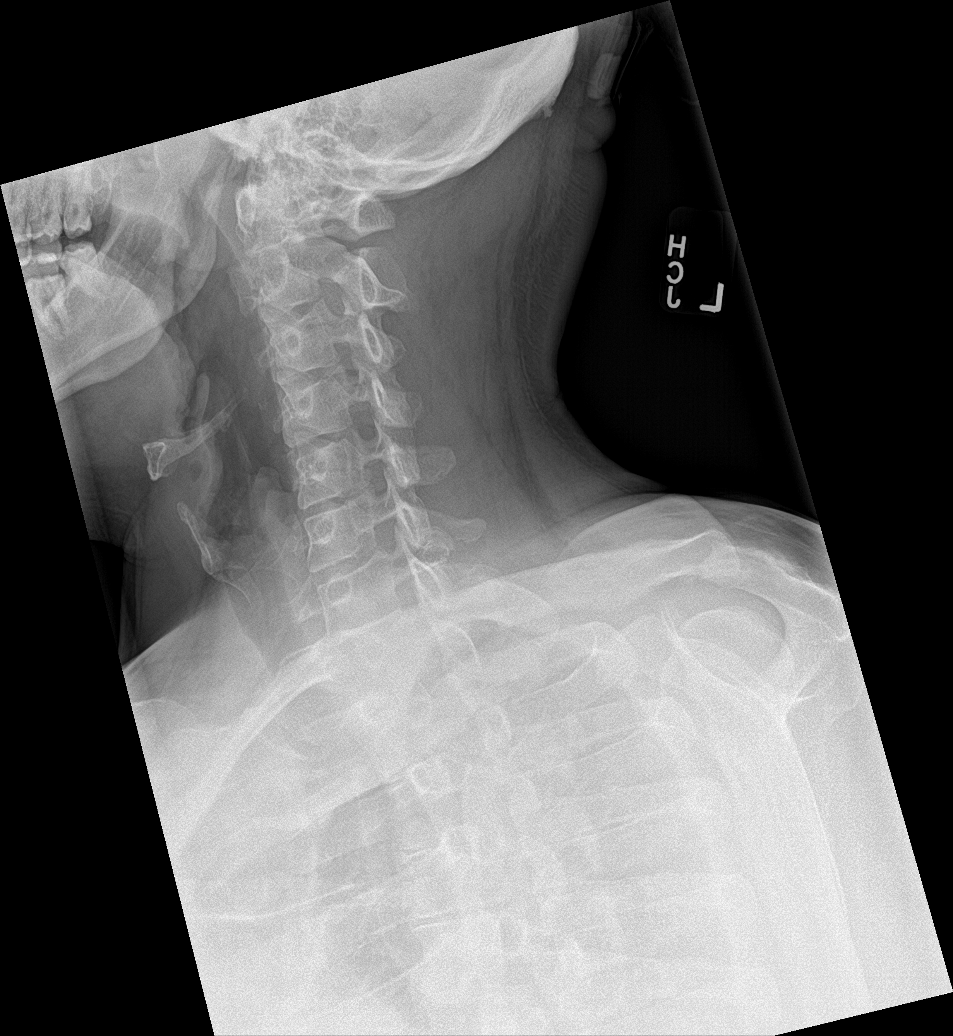

[c-spine obl (2 of 2)]
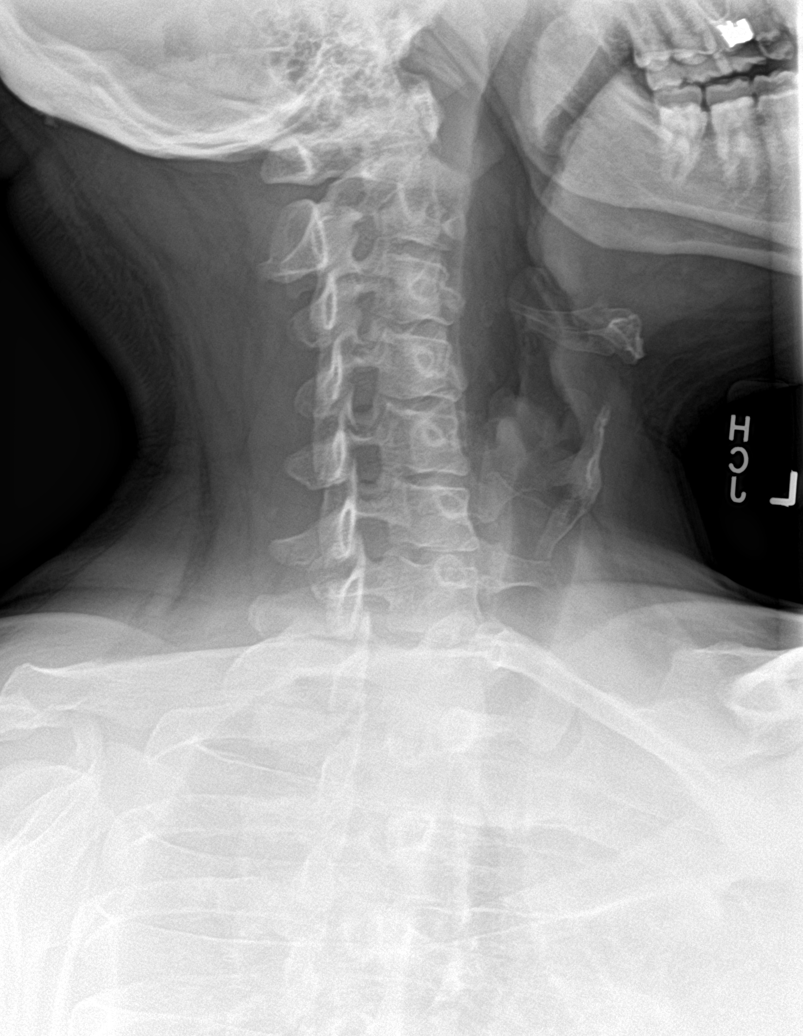

[c-spine ap (1 of 2)]
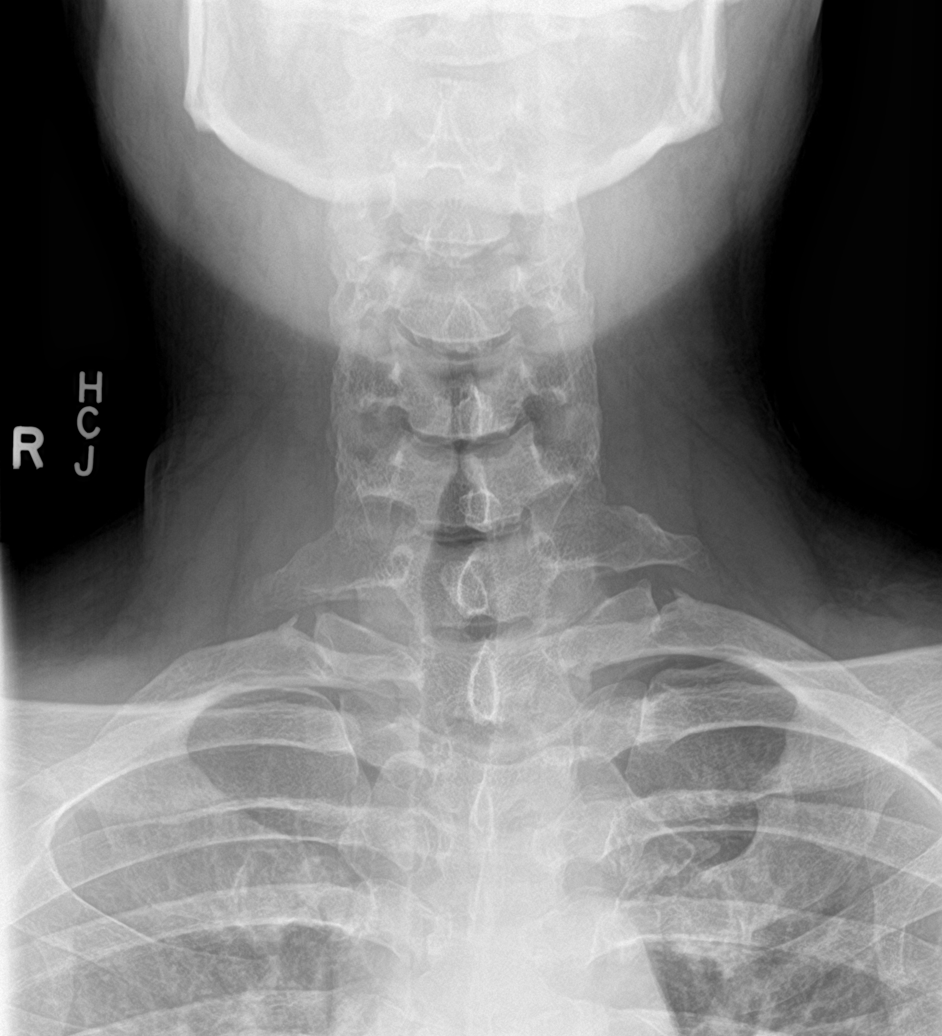

[c-spine open mouth]
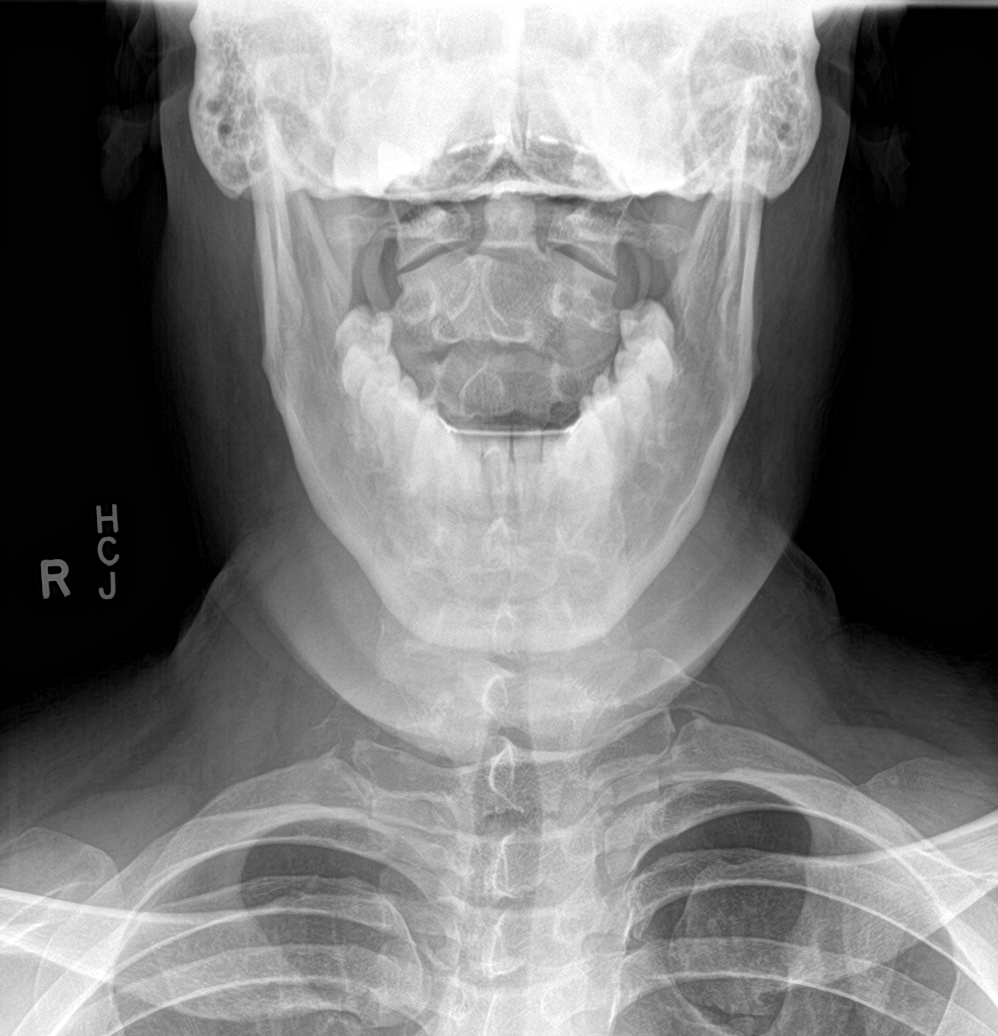

[c-spine ap (2 of 2)]
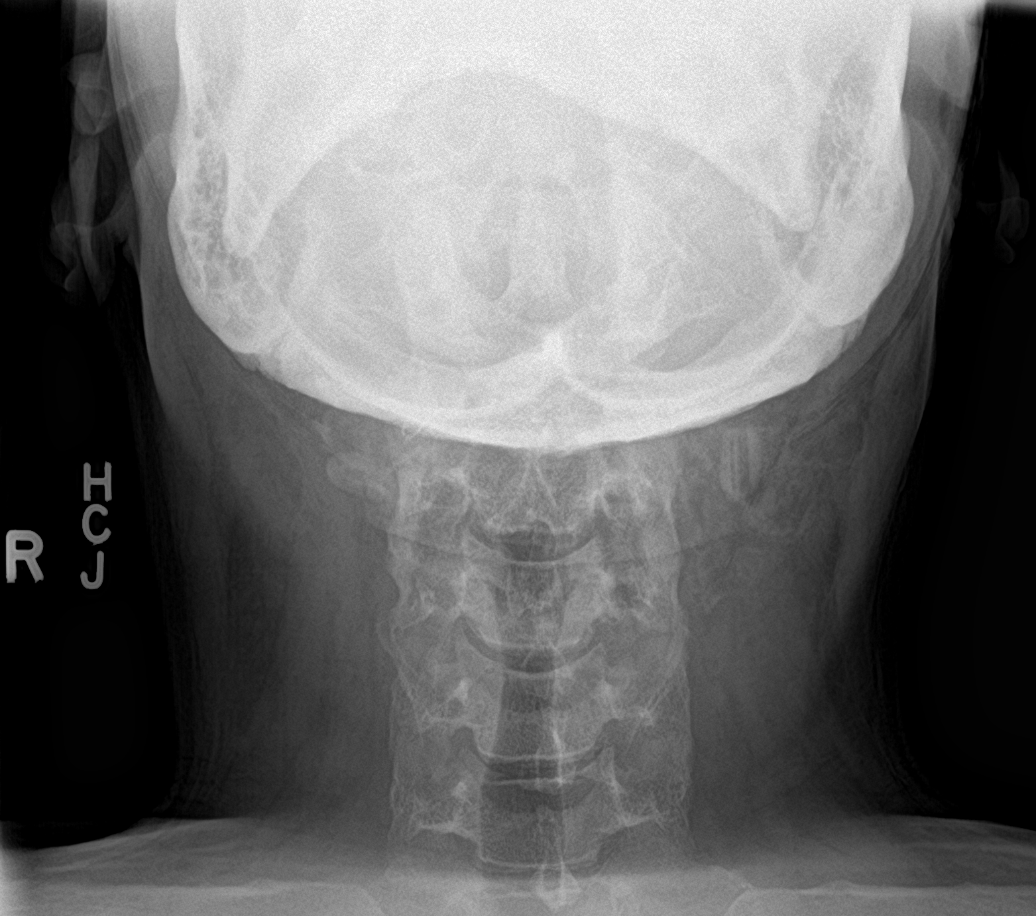

[6 of 6 positions shown; findings below may reference images not displayed]

FINDINGS: Mild reversal of the normal cervical lordosis may be positional in
nature. No acute fracture or listhesis of the cervical spine.
Vertebral body height is preserved. There is mild intervertebral
disc space narrowing at C4-5 and C5-6 in keeping with changes of
mild degenerative disc disease. The spinal canal is widely patent.
Oblique views demonstrates mild bilateral neuroforaminal narrowing
at C3-4 secondary to uncovertebral and facet arthrosis. Remaining
neural foramina are widely patent. Mild uncovertebral arthrosis
noted on the left at C5-6 without significant neuroforaminal
narrowing. The prevertebral soft tissues are unremarkable.
IMPRESSION: Mild degenerative disc and degenerative joint disease resulting in
mild bilateral neuroforaminal narrowing at C3-4.

## 2021-11-12 ENCOUNTER — Other Ambulatory Visit: Payer: Self-pay | Admitting: Family Medicine

## 2021-12-22 ENCOUNTER — Other Ambulatory Visit: Payer: Self-pay | Admitting: Family Medicine

## 2022-01-06 ENCOUNTER — Other Ambulatory Visit: Payer: Self-pay | Admitting: Family Medicine

## 2022-02-02 ENCOUNTER — Other Ambulatory Visit: Payer: Self-pay | Admitting: Family Medicine

## 2022-02-09 ENCOUNTER — Other Ambulatory Visit: Payer: Self-pay | Admitting: Family Medicine

## 2022-03-06 ENCOUNTER — Other Ambulatory Visit: Payer: Self-pay | Admitting: Family Medicine

## 2022-04-17 ENCOUNTER — Other Ambulatory Visit: Payer: Self-pay | Admitting: Family Medicine

## 2022-04-17 NOTE — Telephone Encounter (Signed)
Also needs allopurinol (ZYLOPRIM) 100 MG tablet

## 2022-04-17 NOTE — Telephone Encounter (Signed)
Pt needs refill

## 2022-04-20 ENCOUNTER — Other Ambulatory Visit: Payer: Self-pay

## 2022-04-20 MED ORDER — ALLOPURINOL 100 MG PO TABS: 100.0000 mg | ORAL_TABLET | Freq: Every day | ORAL | 0 refills | Status: DC

## 2022-06-12 ENCOUNTER — Other Ambulatory Visit: Payer: Self-pay | Admitting: Family Medicine

## 2022-06-16 ENCOUNTER — Other Ambulatory Visit: Payer: Self-pay | Admitting: Family Medicine

## 2022-06-20 ENCOUNTER — Other Ambulatory Visit: Payer: Self-pay | Admitting: Family Medicine

## 2022-06-29 ENCOUNTER — Other Ambulatory Visit: Payer: Self-pay

## 2022-06-29 ENCOUNTER — Telehealth: Payer: Self-pay | Admitting: Family Medicine

## 2022-06-29 MED ORDER — AMLODIPINE BESYLATE 10 MG PO TABS: 10.0000 mg | ORAL_TABLET | Freq: Every day | ORAL | 3 refills | Status: DC

## 2022-06-29 NOTE — Telephone Encounter (Signed)
Refills sent to pharmacy, patient advised. 

## 2022-06-29 NOTE — Telephone Encounter (Signed)
Prescription Request  06/29/2022  LOV: Visit date not found  What is the name of the medication or equipment? allopurinol (ZYLOPRIM) 100 MG tablet [[id number]]   Have you contacted your pharmacy to request a refill? Yes   Which pharmacy would you like this sent to?  [facility] [address] [place], [place] - [address] [place] [address] Phone: [phone] Fax: [phone]    Patient notified that their request is being sent to the clinical staff for review and that they should receive a response within 2 business days.   Please advise at Mobile [phone] (mobile)

## 2022-07-14 ENCOUNTER — Other Ambulatory Visit: Payer: Self-pay | Admitting: Family Medicine

## 2022-07-24 ENCOUNTER — Other Ambulatory Visit: Payer: Self-pay | Admitting: Family Medicine

## 2022-08-18 ENCOUNTER — Other Ambulatory Visit: Payer: Self-pay | Admitting: Family Medicine

## 2022-09-08 ENCOUNTER — Other Ambulatory Visit: Payer: Self-pay | Admitting: Family Medicine

## 2022-10-03 ENCOUNTER — Other Ambulatory Visit: Payer: Self-pay | Admitting: Family Medicine

## 2022-10-07 ENCOUNTER — Other Ambulatory Visit: Payer: Self-pay | Admitting: Family Medicine

## 2022-10-14 ENCOUNTER — Other Ambulatory Visit: Payer: Self-pay | Admitting: Family Medicine

## 2022-10-15 ENCOUNTER — Other Ambulatory Visit: Payer: Self-pay

## 2022-10-15 ENCOUNTER — Telehealth: Payer: Self-pay | Admitting: Family Medicine

## 2022-10-15 MED ORDER — ALLOPURINOL 100 MG PO TABS: 100.0000 mg | ORAL_TABLET | Freq: Every day | ORAL | 0 refills | Status: DC

## 2022-10-15 NOTE — Telephone Encounter (Signed)
Prescription Request  10/15/2022  LOV: Visit date not found  What is the name of the medication or equipment? allopurinol (ZYLOPRIM) 100 MG tablet [[id number]]    Have you contacted your pharmacy to request a refill? Yes   Which pharmacy would you like this sent to?  [facility] [address] [place], [place] - [address] [place] [address] Phone: [phone] Fax: [phone]    Patient notified that their request is being sent to the clinical staff for review and that they should receive a response within 2 business days.   Please advise at Mobile [phone] (mobile)

## 2022-10-15 NOTE — Telephone Encounter (Signed)
Refill sent.

## 2022-10-17 ENCOUNTER — Other Ambulatory Visit: Payer: Self-pay | Admitting: Family Medicine

## 2022-11-09 ENCOUNTER — Other Ambulatory Visit: Payer: Self-pay | Admitting: Family Medicine

## 2022-12-08 ENCOUNTER — Other Ambulatory Visit: Payer: Self-pay | Admitting: Family Medicine

## 2023-01-17 ENCOUNTER — Other Ambulatory Visit: Payer: Self-pay | Admitting: Family Medicine

## 2023-01-27 ENCOUNTER — Other Ambulatory Visit: Payer: Self-pay | Admitting: Family Medicine

## 2023-02-19 ENCOUNTER — Other Ambulatory Visit: Payer: Self-pay | Admitting: Family Medicine

## 2023-03-20 ENCOUNTER — Other Ambulatory Visit: Payer: Self-pay | Admitting: Family Medicine

## 2023-03-26 ENCOUNTER — Ambulatory Visit
Admission: EM | Admit: 2023-03-26 | Discharge: 2023-03-26 | Disposition: A | Discharge status: Home / Self Care | Attending: Family Medicine | Admitting: Family Medicine

## 2023-03-26 ENCOUNTER — Other Ambulatory Visit: Payer: Self-pay

## 2023-03-26 DIAGNOSIS — H60392 Other infective otitis externa, left ear: Secondary | ICD-10-CM | POA: Diagnosis not present

## 2023-03-26 MED ORDER — NEOMYCIN-POLYMYXIN-HC 3.5-10000-1 OT SUSP: 4.0000 [drp] | Freq: Three times a day (TID) | OTIC | 0 refills | Status: AC

## 2023-03-26 NOTE — ED Triage Notes (Addendum)
 Pt presents with complaints of swelling and pain in left ear x 2 days. Pt currently rates his overall pain a 6/10. OTC Tylenol and Ibuprofen taken with some relief. Pt states the pain is beginning to radiate to head and neck, only on the left side. Pt does report he noticed yellow drainage from bilateral ears last week.

## 2023-03-26 NOTE — ED Provider Notes (Signed)
 [facility]    CSN: [id number] Arrival date & time: 03/26/23  1822      History   Chief Complaint Chief Complaint  Patient presents with   Otalgia    HPI Danny Gomez is a 38 y.o. male.   The history is provided by the patient.  Otalgia Associated symptoms: diarrhea (Not uncommon for him) and ear discharge   Associated symptoms: no congestion, no cough, no headaches, no sore throat and no vomiting   Some yellow drainage from left ear last week, recently the area has been itchy he has been scratching his finger, today he feels like the ear is swollen he has tenderness when he presses on his tragus and is here.  Recent URI symptoms.  Denies fever, change in hearing, injury.  States he has pain when he swallows reading from his ear to his throat but denies actual sore throat, change in voice, swollen glands or recent travel.  Past Medical History:  Diagnosis Date   Asthma    pt. reports as a child he used an inhaler , but asthma has resoled in the adult path   Febrile illness    Headache(784.0)    Hypertension    Neuromuscular disorder (HCC)    hnp- L4-5, L5-S1   Obesity     Patient Active Problem List   Diagnosis Date Noted   Depression, major, single episode, moderate (HCC) 03/30/2021   GAD (generalized anxiety disorder) 03/25/2021   Bloating symptom 07/15/2020   Nausea 07/15/2020   Hydradenitis 05/13/2020   COVID-19 01/23/2020   Neck pain on left side 12/08/2019   Left leg DVT (HCC) 02/05/2018   Back pain with right-sided sciatica 01/24/2017   IGT (impaired glucose tolerance) 02/04/2015   Vitamin D deficiency 02/06/2013   Allergic rhinitis 10/29/2009   Morbid obesity (HCC) 08/26/2007   Essential hypertension 08/26/2007    Past Surgical History:  Procedure Laterality Date   right ankle surgery  2002   bones had fused but it was corrected surgically   TONSILLECTOMY         Home Medications    Prior to Admission medications   Medication  Sig Start Date End Date Taking? Authorizing Provider  neomycin-polymyxin-hydrocortisone (CORTISPORIN) 3.5-10000-1 OTIC suspension Place 4 drops into the left ear 3 (three) times daily for 7 days. 03/26/23 04/02/23 Yes Meliton Rattan, PA  acetaminophen (TYLENOL) 650 MG CR tablet Take 1,300 mg by mouth every 8 (eight) hours as needed for pain.    [provider]  allopurinol (ZYLOPRIM) 100 MG tablet Take 1 tablet by mouth once daily 03/22/23   Kerri Perches, MD  amLODipine (NORVASC) 10 MG tablet Take 1 tablet (10 mg total) by mouth daily. 06/29/22   Kerri Perches, MD  citalopram (CELEXA) 10 MG tablet Take 1 tablet (10 mg total) by mouth daily. 03/25/21   Kerri Perches, MD  ergocalciferol (VITAMIN D2) 1.25 MG (50000 UT) capsule Take 1 capsule (50,000 Units total) by mouth once a week. One capsule once weekly 03/26/21   Kerri Perches, MD  folic acid (FOLVITE) 400 MCG tablet Take 400 mcg by mouth daily.    [provider]  gabapentin (NEURONTIN) 100 MG capsule Take 1 capsule (100 mg total) by mouth 3 (three) times daily. 03/25/21   Kerri Perches, MD  triamterene-hydrochlorothiazide [facility]) 37.5-25 MG tablet Take 1 tablet by mouth once daily 01/27/23   Kerri Perches, MD    Family History Family History  Problem Relation  Age of Onset   Hypertension Mother    Obesity Mother     Social History Social History   Tobacco Use   Smoking status: Never   Smokeless tobacco: Never  Vaping Use   Vaping status: Never Used  Substance Use Topics   Alcohol use: No    Comment: socially- monthly    Drug use: Yes    Types: Marijuana    Comment: once per day      Allergies   Lisinopril   Review of Systems Review of Systems  HENT:  Positive for ear discharge and ear pain. Negative for congestion, sore throat, trouble swallowing and voice change.   Respiratory:  Negative for cough.   Gastrointestinal:  Positive for diarrhea (Not uncommon for him). Negative  for nausea and vomiting.  Neurological:  Negative for dizziness and headaches.     Physical Exam Triage Vital Signs ED Triage Vitals  Encounter Vitals Group     BP 03/26/23 1826 (!) 148/90     Systolic BP Percentile --      Diastolic BP Percentile --      Pulse Rate 03/26/23 1826 80     Resp 03/26/23 1826 18     Temp 03/26/23 1826 98.6 F (37 C)     Temp Source 03/26/23 1826 Oral     SpO2 03/26/23 1826 95 %     Weight 03/26/23 1826 (!) 325 lb (147.4 kg)     Height 03/26/23 1839 5\' 11"  (1.803 m)     Head Circumference --      Peak Flow --      Pain Score 03/26/23 1839 6     Pain Loc --      Pain Education --      Exclude from Growth Chart --    No data found.  Updated Vital Signs BP (!) 148/90 (BP Location: Right Arm)   Pulse 80   Temp 98.6 F (37 C) (Oral)   Resp 18   Ht 5\' 11"  (1.803 m)   Wt (!) 325 lb (147.4 kg)   SpO2 95%   BMI 45.33 kg/m   Visual Acuity Right Eye Distance:   Left Eye Distance:   Bilateral Distance:    Right Eye Near:   Left Eye Near:    Bilateral Near:     Physical Exam Vitals and nursing note reviewed.  Constitutional:      Appearance: He is not ill-appearing.  HENT:     Head: Normocephalic and atraumatic.     Right Ear: Tympanic membrane and ear canal normal.     Ears:     Comments: Tender left tragus, mild swelling of pinna and meatus, several small is of hair noted in canal    Mouth/Throat:     Mouth: Mucous membranes are moist.  Eyes:     General:        Left eye: No discharge.     Conjunctiva/sclera: Conjunctivae normal.  Cardiovascular:     Rate and Rhythm: Normal rate.     Heart sounds: Normal heart sounds.  Pulmonary:     Effort: Pulmonary effort is normal.  Neurological:     Mental Status: He is alert.      UC Treatments / Results  Labs (all labs ordered are listed, but only abnormal results are displayed) Labs Reviewed - No data to display  EKG   Radiology No results found.  Procedures Procedures  (including critical care time)  Medications Ordered in UC Medications -  No data to display  Initial Impression / Assessment and Plan / UC Course  I have reviewed the triage vital signs and the nursing notes.  Pertinent labs & imaging results that were available during my care of the patient were reviewed by me and considered in my medical decision making (see chart for details).     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  Has left tragus tenderness mild swelling of pinna near meatus.  Will treat for otitis externa, follow-up if symptoms fail to improve Final Clinical Impressions(s) / UC Diagnoses   Final diagnoses:  Infective otitis externa of left ear     Discharge Instructions      Take an over-the-counter antihistamine as discussed.  If your symptoms fail to improve your primary care doctor   ED Prescriptions     Medication Sig Dispense Auth. Provider   neomycin-polymyxin-hydrocortisone (CORTISPORIN) 3.5-10000-1 OTIC suspension Place 4 drops into the left ear 3 (three) times daily for 7 days. 10 mL Meliton Rattan, PA      PDMP not reviewed this encounter.   Meliton Rattan, [place] 03/26/23 [phone]

## 2023-03-26 NOTE — Discharge Instructions (Signed)
 Take an over-the-counter antihistamine as discussed.  If your symptoms fail to improve your primary care doctor

## 2023-03-28 ENCOUNTER — Emergency Department (HOSPITAL_BASED_OUTPATIENT_CLINIC_OR_DEPARTMENT_OTHER)
Admission: EM | Admit: 2023-03-28 | Discharge: 2023-03-29 | Disposition: A | Discharge status: Home / Self Care | Attending: Emergency Medicine | Admitting: Emergency Medicine

## 2023-03-28 ENCOUNTER — Other Ambulatory Visit: Payer: Self-pay

## 2023-03-28 ENCOUNTER — Encounter (HOSPITAL_BASED_OUTPATIENT_CLINIC_OR_DEPARTMENT_OTHER): Payer: Self-pay | Admitting: Urology

## 2023-03-28 DIAGNOSIS — R220 Localized swelling, mass and lump, head: Secondary | ICD-10-CM | POA: Diagnosis not present

## 2023-03-28 DIAGNOSIS — L03818 Cellulitis of other sites: Secondary | ICD-10-CM

## 2023-03-28 DIAGNOSIS — H9202 Otalgia, left ear: Secondary | ICD-10-CM | POA: Insufficient documentation

## 2023-03-28 LAB — BASIC METABOLIC PANEL
Anion gap: 11 (ref 5–15)
BUN: 13 mg/dL (ref 6–20)
CO2: 25 mmol/L (ref 22–32)
Calcium: 9.3 mg/dL (ref 8.9–10.3)
Chloride: 98 mmol/L (ref 98–111)
Creatinine, Ser: 0.96 mg/dL (ref 0.61–1.24)
GFR, Estimated: >60 mL/min (ref >60)
Glucose, Bld: 107 mg/dL — ABNORMAL HIGH (ref 70–99)
Potassium: 3.6 mmol/L (ref 3.5–5.1)
Sodium: 134 mmol/L — ABNORMAL LOW (ref 135–145)

## 2023-03-28 LAB — CBC WITH DIFFERENTIAL/PLATELET
Abs Immature Granulocytes: 0.06 10*3/uL (ref 0.00–0.07)
Basophils Absolute: 0.1 10*3/uL (ref 0.0–0.1)
Basophils Relative: 0 %
Eosinophils Absolute: 0.1 10*3/uL (ref 0.0–0.5)
Eosinophils Relative: 0 %
HCT: 50.3 % (ref 39.0–52.0)
Hemoglobin: 17.1 g/dL — ABNORMAL HIGH (ref 13.0–17.0)
Immature Granulocytes: 0 %
Lymphocytes Relative: 9 %
Lymphs Abs: 1.6 10*3/uL (ref 0.7–4.0)
MCH: 30.3 pg (ref 26.0–34.0)
MCHC: 34 g/dL (ref 30.0–36.0)
MCV: 89 fL (ref 80.0–100.0)
Monocytes Absolute: 2 10*3/uL — ABNORMAL HIGH (ref 0.1–1.0)
Monocytes Relative: 11 %
Neutro Abs: 13.9 10*3/uL — ABNORMAL HIGH (ref 1.7–7.7)
Neutrophils Relative %: 80 %
Platelets: 296 10*3/uL (ref 150–400)
RBC: 5.65 MIL/uL (ref 4.22–5.81)
RDW: 13.4 % (ref 11.5–15.5)
WBC: 17.7 10*3/uL — ABNORMAL HIGH (ref 4.0–10.5)
nRBC: 0 % (ref 0.0–0.2)

## 2023-03-28 MED ORDER — MORPHINE SULFATE (PF) 4 MG/ML IV SOLN
4.0000 mg | Freq: Once | INTRAVENOUS | Status: AC
  Administered 2023-03-28: 4 mg via INTRAVENOUS
  Filled 2023-03-28: qty 1

## 2023-03-28 MED ORDER — ACETAMINOPHEN 325 MG PO TABS
650.0000 mg | ORAL_TABLET | Freq: Once | ORAL | Status: AC
  Administered 2023-03-28: 650 mg via ORAL
  Filled 2023-03-28: qty 2

## 2023-03-28 MED ORDER — SODIUM CHLORIDE 0.9 % IV BOLUS
1000.0000 mL | Freq: Once | INTRAVENOUS | Status: AC
  Administered 2023-03-28: 1000 mL via INTRAVENOUS

## 2023-03-28 NOTE — ED Triage Notes (Signed)
 Left ear pain x 4 days  Was seen at [facility] on 3/7 and started on ear drop antibiotic  States now is swelling more and more painful  Denies drainage at this time  Denies fever but states chills at home

## 2023-03-28 NOTE — ED Provider Notes (Signed)
 [place] [facility] Provider Note   CSN: [id number] Arrival date & time: 03/28/23  2101     History  Chief Complaint  Patient presents with   Otalgia    Danny Gomez is a 38 y.o. male.  HPI   38 year old male presents the emergency department ongoing left ear pain.  Patient states has been ongoing for about 4 days in total.  Was seen at urgent care on 3/7 and started on eardrops for presumed otitis externa.  Yesterday with ongoing pain he had a virtual visit and was placed on Augmentin.  He is only had a few doses of that.  Comes in today with worsening ear and side of the face swelling, pain, fever.  Denies any vision changes, double vision.  Home Medications Prior to Admission medications   Medication Sig Start Date End Date Taking? Authorizing Provider  acetaminophen (TYLENOL) 650 MG CR tablet Take 1,300 mg by mouth every 8 (eight) hours as needed for pain.    [provider]  allopurinol (ZYLOPRIM) 100 MG tablet Take 1 tablet by mouth once daily 03/22/23   Kerri Perches, MD  amLODipine (NORVASC) 10 MG tablet Take 1 tablet (10 mg total) by mouth daily. 06/29/22   Kerri Perches, MD  citalopram (CELEXA) 10 MG tablet Take 1 tablet (10 mg total) by mouth daily. 03/25/21   Kerri Perches, MD  ergocalciferol (VITAMIN D2) 1.25 MG (50000 UT) capsule Take 1 capsule (50,000 Units total) by mouth once a week. One capsule once weekly 03/26/21   Kerri Perches, MD  folic acid (FOLVITE) 400 MCG tablet Take 400 mcg by mouth daily.    [provider]  gabapentin (NEURONTIN) 100 MG capsule Take 1 capsule (100 mg total) by mouth 3 (three) times daily. 03/25/21   Kerri Perches, MD  neomycin-polymyxin-hydrocortisone (CORTISPORIN) 3.5-10000-1 OTIC suspension Place 4 drops into the left ear 3 (three) times daily for 7 days. 03/26/23 04/02/23  Meliton Rattan, PA  triamterene-hydrochlorothiazide [facility]) 37.5-25 MG tablet Take  1 tablet by mouth once daily 01/27/23   Kerri Perches, MD      Allergies    Lisinopril    Review of Systems   Review of Systems  Constitutional:  Positive for chills and fever.  HENT:  Positive for ear pain and facial swelling. Negative for hearing loss.   Eyes:  Negative for visual disturbance.  Respiratory:  Negative for shortness of breath.   Cardiovascular:  Negative for chest pain.  Gastrointestinal:  Negative for abdominal pain, diarrhea and vomiting.  Skin:  Negative for rash.  Neurological:  Negative for headaches.    Physical Exam Updated Vital Signs BP (!) 152/81 (BP Location: Left Arm)   Pulse (!) 107   Temp (!) 100.5 F (38.1 C)   Resp 20   Ht 5\' 11"  (1.803 m)   Wt (!) 147 kg   SpO2 98%   BMI 45.20 kg/m  Physical Exam Vitals and nursing note reviewed.  Constitutional:      General: He is not in acute distress.    Appearance: Normal appearance.  HENT:     Head: Normocephalic.     Right Ear: Tympanic membrane and external ear normal.     Left Ear: Tympanic membrane normal.     Ears:     Comments: Erythema, swelling and tenderness of the auricle extending to the temple, TMJ area and posterior to the ear as well as down the neck.  Tender to palpation, no crepitus    Mouth/Throat:     Mouth: Mucous membranes are moist.  Eyes:     Extraocular Movements: Extraocular movements intact.     Pupils: Pupils are equal, round, and reactive to light.  Cardiovascular:     Rate and Rhythm: Normal rate.  Pulmonary:     Effort: Pulmonary effort is normal. No respiratory distress.  Abdominal:     Palpations: Abdomen is soft.     Tenderness: There is no abdominal tenderness.  Skin:    General: Skin is warm.  Neurological:     Mental Status: He is alert and oriented to person, place, and time. Mental status is at baseline.  Psychiatric:        Mood and Affect: Mood normal.     ED Results / Procedures / Treatments   Labs (all labs ordered are listed, but only  abnormal results are displayed) Labs Reviewed  CBC WITH DIFFERENTIAL/PLATELET  BASIC METABOLIC PANEL    EKG None  Radiology No results found.  Procedures Procedures    Medications Ordered in ED Medications  sodium chloride 0.9 % bolus 1,000 mL (has no administration in time range)  morphine (PF) 4 MG/ML injection 4 mg (has no administration in time range)  acetaminophen (TYLENOL) tablet 650 mg (650 mg Oral Given 03/28/23 2305)    ED Course/ Medical Decision Making/ A&P                                 Medical Decision Making Amount and/or Complexity of Data Reviewed Labs: ordered. Radiology: ordered.  Risk OTC drugs. Prescription drug management.   38 year old male presents emergency department with worsening left ear pain, swelling, fever.  Currently on eardrops for otitis externa and oral Augmentin for presumed otitis media.  Patient is febrile and tachycardic on arrival.  The left ear is significantly swollen, erythematous extending to the temporal bone, posteriorly and around the TMJ area and down the left neck.  The TM on the left appears unremarkable, ear canal shows no acute findings.  External exam looks consistent with perichondritis but with the involvement of the temple area and swelling of the face/neck concern to rule out mastoiditis.  Blood work, medicine and CT scan ordered.  Patient signed out pending results.        Final Clinical Impression(s) / ED Diagnoses Final diagnoses:  None    Rx / DC Orders ED Discharge Orders     None         Rozelle Logan, DO 03/28/23 2313

## 2023-03-29 ENCOUNTER — Emergency Department (HOSPITAL_BASED_OUTPATIENT_CLINIC_OR_DEPARTMENT_OTHER)

## 2023-03-29 ENCOUNTER — Ambulatory Visit: Payer: Self-pay | Admitting: Family Medicine

## 2023-03-29 ENCOUNTER — Encounter (HOSPITAL_BASED_OUTPATIENT_CLINIC_OR_DEPARTMENT_OTHER): Payer: Self-pay

## 2023-03-29 MED ORDER — CLINDAMYCIN PHOSPHATE 600 MG/50ML IV SOLN
600.0000 mg | Freq: Once | INTRAVENOUS | Status: AC
  Administered 2023-03-29: 600 mg via INTRAVENOUS
  Filled 2023-03-29: qty 50

## 2023-03-29 MED ORDER — OXYCODONE-ACETAMINOPHEN 5-325 MG PO TABS
1.0000 | ORAL_TABLET | Freq: Once | ORAL | Status: AC
  Administered 2023-03-29: 1 via ORAL
  Filled 2023-03-29: qty 1

## 2023-03-29 MED ORDER — IOHEXOL 300 MG/ML  SOLN
80.0000 mL | Freq: Once | INTRAMUSCULAR | Status: AC | PRN
  Administered 2023-03-29: 80 mL via INTRAVENOUS

## 2023-03-29 MED ORDER — CLINDAMYCIN HCL 150 MG PO CAPS: 450.0000 mg | ORAL_CAPSULE | Freq: Three times a day (TID) | ORAL | 0 refills | Status: AC

## 2023-03-29 NOTE — Discharge Instructions (Signed)
 Pleas switch your antibiotic to Clindamycin. You may STOP taking the Augmentin. Please call the ENT office in the morning to schedule the next available appointment. If you develop any new or suddenly worsening symptoms, return to the ED.

## 2023-03-29 NOTE — ED Provider Notes (Signed)
 Blood pressure 108/62, pulse 92, temperature 99.4 F (37.4 C), temperature source Oral, resp. rate 18, height 5\' 11"  (1.803 m), weight (!) 147 kg, SpO2 98%.  Assuming care from Dr. Wilkie Aye.  In short, Danny Gomez is a 39 y.o. male with a chief complaint of Otalgia .  Refer to the original H&P for additional details.  The current plan of care is to follow up on CT temporal bone and reassess.  12:58 AM  CT temporal bone consistent with otitis externa and cellulitis. No drainable abscess or evidence of mastoiditis.  On reevaluation the patient is very well-appearing.  He does have some swelling noted but no trismus.  No evidence of surgical process on CT.  He has been on 2 days of Augmentin with slight worsening symptoms.  Plan to switch him to clindamycin but appears stable to continue treatment as an outpatient for now.  Contact information for ENT to call for follow-up and gave strict ED return precautions.   Maia Plan, MD 03/29/23 [phone]

## 2023-03-29 NOTE — Telephone Encounter (Addendum)
 Copied from CRM [id number]. Topic: Clinical - Red Word Triage >> Mar 29, 2023  9:25 AM Elmarie Shiley B wrote: Red Word that prompted transfer to Nurse Triage: Patient was seen [facility] Emergency Department at [facility] on 03/28/2023 and states his face and ear are swollen and he's in severe pain.   Chief Complaint: Ear infection Symptoms: Ear pain, facial pain Pertinent Negatives: Patient denies ear discharge Disposition: [] ED /[] Urgent Care (no appt availability in office) / [] Appointment(In office/virtual)/ []  [place] [facility]/ [x] Home Care/ [] Refused Recommended Disposition /[] [place] [facility]/ []  Follow-up with PCP Additional Notes: Patient stated that he has an ear infection. His ear pain started 4 days ago. He was seen in urgent care and ED. He stated he was initially taking an antibiotic and the ED provider prescribed a different antibiotic yesterday. Patient stated they did not prescribe pain medicine and he is hoping to get a prescription from PCP. Patient has not started new antibiotic yet.  Patient was previously advised to follow up with ENT specialist per ED note recommendations. This RN advised patient to start the new antibiotics and it should help with the pain. Also advised that patient can alternate Tylenol/Ibuprofen for pain relief. Instructed patient to notify the office if there is no improvement after a few days of the antibiotics. Patient has appt with ENT on Wednesday.    Reason for Disposition  [1] Taking antibiotic (ear drops or pills) < 72 hours (3 days) AND [2] ear pain not improved    Need to start antibiotic  Answer Assessment - Initial Assessment Questions 1. ANTIBIOTIC: "What antibiotic are you using?" "How many times a day?" "Ear drops or pills"     Pills, Clindamycin  2. ONSET: "When was the antibiotic started?"     Has not started new antibiotic yet  3. PAIN: "How bad is the pain?"  (Scale 1-10; mild, moderate or severe)   - MILD (1-3):  doesn't interfere with normal activities    - MODERATE (4-7): interferes with normal activities or awakens from sleep    - SEVERE (8-10): excruciating pain, unable to do any normal activities      8/10  4. DISCHARGE: "Is there any discharge? What color is it?"      No  5. FEVER: "Do you have a fever?" If Yes, ask: "What is your temperature, how was it measured, and when did it start?"     Fever last night 100.5, but not feeling feverish now  6. OTHER SYMPTOMS: "Do you have any other symptoms?" (e.g., redness of ear, headache, stiff neck, sore throat)     Pain on sides of face, top of brows, under jaw bone, neck  Protocols used: Ear - Otitis Externa Follow-up Call-A-AH

## 2023-03-30 ENCOUNTER — Ambulatory Visit: Payer: Self-pay | Admitting: Family Medicine

## 2023-03-30 ENCOUNTER — Telehealth (INDEPENDENT_AMBULATORY_CARE_PROVIDER_SITE_OTHER): Admitting: Medical

## 2023-03-30 VITALS — Temp 96.0°F

## 2023-03-30 DIAGNOSIS — L0390 Cellulitis, unspecified: Secondary | ICD-10-CM | POA: Diagnosis not present

## 2023-03-30 NOTE — Progress Notes (Unsigned)
 Virtual Visit via Video Note  I connected with Danny Gomez on 03/30/23 at  2:40 PM EDT by a video enabled telemedicine application and verified that I am speaking with the correct person using two identifiers.  Location: Patient: at work [place] Provider: office [place].   I discussed the limitations of evaluation and management by telemedicine and the availability of in person appointments. The patient expressed understanding and agreed to proceed.  History of Present Illness: Discussed the use of AI scribe software for clinical note transcription with the patient, who gave verbal consent to proceed.    History of Present Illness         Discussed the use of AI scribe software for clinical note transcription with the patient, who gave verbal consent to proceed.  History of Present Illness   He presents with swelling around the eyes and an ear infection.  He has swelling around his eyes and an ear infection, primarily affecting the left ear. The swelling extended from the ear down into the neck but has started to decrease. He still experiences some tightness and soreness in the area. The ear infection began on Thursday night, with worsening symptoms by Friday and significant swelling by early Saturday morning. The swelling was described as 'huge' and involved the ear and surrounding areas.  He visited urgent care on Friday and the emergency room at [facility] on Saturday night, where he received IV antibiotics and pain meds. He was diagnosed with cellulitis and prescribed oral antibiotics, initially Augmentin, which was switched to clindamycin 150 mg three times a day. The swelling in the ear has decreased significantly, and there is no longer tenderness to touch, although there is still some reported swelling under the left eye.  He has a history of a cyst on the earlobe, which previously drained pus about a month ago and then reformed. No itching is associated with the  swelling, and he has removed his earring since the swelling began but did not notice difference and replaced earing.   He experienced a fever while at the medical center and sweating last night, which he attributes to the medication. His blood pressure was elevated at urgent care and the medical center but normalized with pain management. No current tenderness to touch in the affected areas.         Observations/Objective: General-no acute distress, pleasant, oriented. Lungs- on inspection lungs appear unlabored. Neck- no tracheal deviation or jvd on inspection. Neuro- gross motor function appears intact. Skin- left ear looks mild swollen compared to rt side. Lobe on left side appears mild swollen. Mild swelling in left lower eye lid area. Normal coloration to skin.  Assessment and Plan: Assessment and Plan          Assessment and Plan    Cellulitis with otitis externa Difficult to assess on video. But swelling looks mild-moderate. Treated with IV antibiotics and pain management. CT scan showed no abscess or mastoiditis. Advised clindamycin dose increased due to concern severity. Concern for potential bone infection considered but not seen on CT. - Increase clindamycin to 300 mg TID until ENT appointment this thursday(explained importance of that visit). - Use Benadryl for itching. - Maintain probiotic-rich diet. - Monitor for fever, chills, or worsening symptoms and report via MyChart. - Send visit note to primary care physician. - Follow up with ENT on Thursday.  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 Not related to piercing. - Discuss earlobe cyst with  ENT during upcoming appointment.   Follow up with pcp after ENT visit or sooner if needed           Follow Up Instructions:    I discussed the assessment and treatment plan with the patient. The patient was provided an opportunity to ask questions and all were answered. The patient  agreed with the plan and demonstrated an understanding of the instructions.   The patient was advised to call back or seek an in-person evaluation if the symptoms worsen or if the condition fails to improve as anticipated.   Esperanza Richters, PA-C

## 2023-03-30 NOTE — Telephone Encounter (Signed)
 Pls advise any recommendations

## 2023-03-30 NOTE — Telephone Encounter (Signed)
 Chief Complaint: Left lower eyelid swelling Symptoms: see above Frequency: since last night Pertinent Negatives: Patient denies itching/pain/discharge Disposition: [] ED /[] Urgent Care (no appt availability in office) / [x] Appointment(In office/virtual)/ []  [place] [facility]/ [] Home Care/ [] Refused Recommended Disposition /[] [facility]/ []  Follow-up with PCP Additional Notes: Patient called in to be evaluated for eyelid swelling that is worsening. Patient has been seen in UC twice in the last 4 days regarding an ear infection and has been given antibiotics. Patient advised to inform HCP today of history of last 4 days and antibiotic usage. Patient requesting virtual appt at this time.    Copied from CRM [id number]. Topic: Clinical - Red Word Triage >> Mar 30, 2023  9:42 AM Shon Hale wrote: Red Word that prompted transfer to Nurse Triage: Patient's eye is swelling, started last night. Swelling is getting worse. Reason for Disposition  MODERATE-SEVERE eyelid swelling on one side  (Exception: Due to a mosquito bite.)  Answer Assessment - Initial Assessment Questions 1. ONSET: "When did the swelling start?" (e.g., minutes, hours, days)     Late last night  2. LOCATION: "What part of the eyelids is swollen?"     Lower eyelid on left eye 3. SEVERITY: "How swollen is it?"     To the point the patient can't see  4. ITCHING: "Is there any itching?" If Yes, ask: "How much?"   (Scale 1-10; mild, moderate or severe)     Not reported by patient 5. PAIN: "Is the swelling painful to touch?" If Yes, ask: "How painful is it?"   (Scale 1-10; mild, moderate or severe)     Pressure, no pain 6. FEVER: "Do you have a fever?" If Yes, ask: "What is it, how was it measured, and when did it start?"      Intermittent 7. CAUSE: "What do you think is causing the swelling?"     Patient has been to UC recently and diagnosed with Cellulitis  8. RECURRENT SYMPTOM: "Have you had eyelid swelling before?" If  Yes, ask: "When was the last time?" "What happened that time?"     N/a 9. OTHER SYMPTOMS: "Do you have any other symptoms?" (e.g., blurred vision, eye discharge, rash, runny nose)     None  Protocols used: Eye - Swelling-A-AH

## 2023-03-31 ENCOUNTER — Institutional Professional Consult (permissible substitution) (INDEPENDENT_AMBULATORY_CARE_PROVIDER_SITE_OTHER)

## 2023-03-31 NOTE — Patient Instructions (Signed)
 Cellulitis with otitis externa Difficult to assess on video. But swelling looks mild-moderate. Treated with IV antibiotics and pain management. CT scan showed no abscess or mastoiditis. Advised clindamycin dose increased due to concern severity. Concern for potential bone infection considered but not seen on CT. - Increase clindamycin to 300 mg TID until ENT appointment this thursday(explained importance of that visit). - Use Benadryl for itching. - Maintain probiotic-rich diet. - Monitor for fever, chills, or worsening symptoms and report via MyChart. - Send visit note to primary care physician. - Follow up with ENT on Thursday.  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 Not related to piercing. - Discuss earlobe cyst with ENT during upcoming appointment.   Follow up with pcp after ENT visit or sooner if needed

## 2023-03-31 NOTE — Telephone Encounter (Signed)
 Called pt, no answer. Sent mychart msg on active [employer]

## 2023-04-01 ENCOUNTER — Ambulatory Visit (INDEPENDENT_AMBULATORY_CARE_PROVIDER_SITE_OTHER): Admitting: Physician Assistant

## 2023-04-01 ENCOUNTER — Encounter (INDEPENDENT_AMBULATORY_CARE_PROVIDER_SITE_OTHER): Payer: Self-pay | Admitting: Physician Assistant

## 2023-04-01 DIAGNOSIS — H60502 Unspecified acute noninfective otitis externa, left ear: Secondary | ICD-10-CM

## 2023-04-01 DIAGNOSIS — J309 Allergic rhinitis, unspecified: Secondary | ICD-10-CM | POA: Diagnosis not present

## 2023-04-01 DIAGNOSIS — H6090 Unspecified otitis externa, unspecified ear: Secondary | ICD-10-CM | POA: Diagnosis not present

## 2023-04-01 MED ORDER — FLUTICASONE PROPIONATE 50 MCG/ACT NA SUSP: 2.0000 | Freq: Every day | NASAL | 6 refills | Status: DC

## 2023-04-01 NOTE — Patient Instructions (Addendum)
 Reflex Gourmet - Please try this for your reflux. Take after each meal and before bed.    Please follow up with Ashok Croon MD as needed for voice related concerns.

## 2023-04-01 NOTE — Progress Notes (Signed)
 Dear Dr. Lodema Hong, Here is my assessment for our mutual patient, Danny Gomez. Thank you for allowing me the opportunity to care for your patient. Please do not hesitate to contact me should you have any other questions. Sincerely, Burna Forts PA-C  Otolaryngology Clinic Note Referring provider: Dr. Lodema Hong HPI:  Danny Gomez is a 38 y.o. male kindly referred by Dr. Lodema Hong   The patient is a 38 year old gentleman seen in our office for ER follow-up status post otitis externa of his left ear.  The patient notes that approximate 1 month ago he was having some sinus issues with drainage.  He notes this was not severe and took over-the-counter medications which resolved his symptoms.  He reports that approximately 2 weeks later he started to have some pain in his left ear and felt it very itchy.  He notes he started using peroxide and a Q-tip daily to clean out the ear.  He notes that the pain and swelling continue to worsen both within the ear, the outside of the ear, posterior to the ear and swelling up into the temporal region.  The patient notes symptoms got so severe that he went to urgent care on 03/26/2023 he was started on Cortisporin otic drops.  He notes the symptoms did not improve so he sought care through virtual care and was started on Augmentin, the symptoms continue to worsen and was seen in the emergency room.  At that time he had rather severe swelling in and around the ear, fever of 100.5, he had CT temporal bones with showed otitis externa and cellulitis, no fluid collection. No middle ear effusion or mastoid effusions. He was switched over to clindamycin and discharged home. He notes that after 1.5 days of the clindamycin his symptoms improved. At this point he has been on 4/7 days of the antibiotics. He notes symptoms have improved and has no significant pain, swelling, pain, fever or any other infectious signs or symptoms. He has no history of immunosuppression, diabetes, repeat  infections. No previous ear problems.   He additionally notes a history of seasonal allergies for which he uses afrin as needed, zyrtec and allegra.    He also notes that he enjoys singing and his voice is not as strong as it used to be. He denies any hoarseness or pain. Occasionally he has reflux after eating.    Independent Review of Additional Tests or Records:  none   PMH/Meds/All/SocHx/FamHx/ROS:   Past Medical History:  Diagnosis Date   Asthma    pt. reports as a child he used an inhaler , but asthma has resoled in the adult path   Febrile illness    Headache(784.0)    Hypertension    Neuromuscular disorder (HCC)    hnp- L4-5, L5-S1   Obesity      Past Surgical History:  Procedure Laterality Date   right ankle surgery  2002   bones had fused but it was corrected surgically   TONSILLECTOMY      Family History  Problem Relation Age of Onset   Hypertension Mother    Obesity Mother      Social Connections: Not on file      Current Outpatient Medications:    acetaminophen (TYLENOL) 650 MG CR tablet, Take 1,300 mg by mouth every 8 (eight) hours as needed for pain., Disp: , Rfl:    allopurinol (ZYLOPRIM) 100 MG tablet, Take 1 tablet by mouth once daily, Disp: 30 tablet, Rfl: 0   amLODipine (NORVASC) 10 MG  tablet, Take 1 tablet (10 mg total) by mouth daily., Disp: 90 tablet, Rfl: 3   citalopram (CELEXA) 10 MG tablet, Take 1 tablet (10 mg total) by mouth daily., Disp: 30 tablet, Rfl: 3   clindamycin (CLEOCIN) 150 MG capsule, Take 3 capsules (450 mg total) by mouth 3 (three) times daily for 7 days., Disp: 63 capsule, Rfl: 0   ergocalciferol (VITAMIN D2) 1.25 MG (50000 UT) capsule, Take 1 capsule (50,000 Units total) by mouth once a week. One capsule once weekly, Disp: 12 capsule, Rfl: 3   folic acid (FOLVITE) 400 MCG tablet, Take 400 mcg by mouth daily., Disp: , Rfl:    gabapentin (NEURONTIN) 100 MG capsule, Take 1 capsule (100 mg total) by mouth 3 (three) times daily.,  Disp: 30 capsule, Rfl: 3   neomycin-polymyxin-hydrocortisone (CORTISPORIN) 3.5-10000-1 OTIC suspension, Place 4 drops into the left ear 3 (three) times daily for 7 days., Disp: 10 mL, Rfl: 0   triamterene-hydrochlorothiazide (MAXZIDE-25) 37.5-25 MG tablet, Take 1 tablet by mouth once daily, Disp: 90 tablet, Rfl: 0   Physical Exam:   There were no vitals taken for this visit.  Pertinent Findings  CN II-XII intact  Bilateral EAC clear and TM intact with well pneumatized middle ear spaces, minor erythema of the left TM, no bulging, dry skin throughout left EAC and ear.  Weber 512: equal Rinne 512: AC > BC b/l  Anterior rhinoscopy: Septum midline; bilateral inferior turbinates with hypertrophy and edema  No lesions of oral cavity/oropharynx; dentition WNL No obviously palpable neck masses/lymphadenopathy/thyromegaly No respiratory distress or stridor   Seprately Identifiable Procedures:  None  Impression & Plans:  Danny Gomez is a 38 y.o. male with the following   Otitis externa-   Fortunately this symptoms have nearly resolved. He as another 3 days of antibiotics left. If he has any residual symptoms after completing therapy he will call our office.   Allergic rhinitis-  I would like to see him back in the clinic in 3 months for repeat evaluation after medical management. Start Flonase daily, nasal saline irrigation, Claritin.  Sooner if needed.   - f/u 3 months for allergic rhinitis, PRN for otitis externa    Thank you for allowing me the opportunity to care for your patient. Please do not hesitate to contact me should you have any other questions.  Sincerely, Burna Forts PA-C [facility] Phone: [phone] Fax: [phone]  04/01/2023, 10:09 AM

## 2023-04-27 ENCOUNTER — Emergency Department (HOSPITAL_BASED_OUTPATIENT_CLINIC_OR_DEPARTMENT_OTHER)
Admission: EM | Admit: 2023-04-27 | Discharge: 2023-04-27 | Disposition: A | Discharge status: Home / Self Care | Source: Home / Self Care | Attending: Emergency Medicine | Admitting: Emergency Medicine

## 2023-04-27 ENCOUNTER — Other Ambulatory Visit: Payer: Self-pay

## 2023-04-27 ENCOUNTER — Encounter (HOSPITAL_BASED_OUTPATIENT_CLINIC_OR_DEPARTMENT_OTHER): Payer: Self-pay | Admitting: Emergency Medicine

## 2023-04-27 DIAGNOSIS — R112 Nausea with vomiting, unspecified: Secondary | ICD-10-CM | POA: Insufficient documentation

## 2023-04-27 DIAGNOSIS — R197 Diarrhea, unspecified: Secondary | ICD-10-CM | POA: Insufficient documentation

## 2023-04-27 DIAGNOSIS — R109 Unspecified abdominal pain: Secondary | ICD-10-CM | POA: Insufficient documentation

## 2023-04-27 DIAGNOSIS — L03116 Cellulitis of left lower limb: Secondary | ICD-10-CM | POA: Diagnosis not present

## 2023-04-27 LAB — RESP PANEL BY RT-PCR (RSV, FLU A&B, COVID)  RVPGX2
Influenza A by PCR: NEGATIVE
Influenza B by PCR: NEGATIVE
Resp Syncytial Virus by PCR: NEGATIVE
SARS Coronavirus 2 by RT PCR: NEGATIVE

## 2023-04-27 MED ORDER — ONDANSETRON 4 MG PO TBDP: 4.0000 mg | ORAL_TABLET | Freq: Three times a day (TID) | ORAL | 0 refills | Status: DC | PRN

## 2023-04-27 MED ORDER — ONDANSETRON 4 MG PO TBDP
4.0000 mg | ORAL_TABLET | Freq: Once | ORAL | Status: AC
  Administered 2023-04-27: 4 mg via ORAL
  Filled 2023-04-27: qty 1

## 2023-04-27 NOTE — Discharge Instructions (Signed)
 You appear to have a stomach bug which needs time to run its course.  Symptoms typically resolve in the next 2-3 days.   Your flu, covid, and RSV test were negative today  You have been prescribed Zofran (ondansetron) for nausea and vomiting. You may take this every 8 hours as needed for nausea and vomiting. This medication dissolves under the tongue. You do not need to swallow it.  You were given your first dose here today.   Home care instructions:  You can take Tylenol and/or Ibuprofen as directed on the packaging for fever reduction and pain relief.    For cough: honey 1/2 to 1 teaspoon (you can dilute the honey in water or another fluid).  You can also use guaifenesin and dextromethorphan for cough which are over-the-counter medications. You can use a humidifier for chest congestion and cough.  If you don't have a humidifier, you can sit in the bathroom with the hot shower running.      For sore throat: try warm salt water gargles, cepacol lozenges, throat spray, warm tea or water with lemon/honey, popsicles or ice, or OTC cold relief medicine for throat discomfort.    For congestion: Flonase (Fluticasone) 1-2 sprays in each nostril daily. This is an over the counter medication.    It is important to stay hydrated: drink plenty of fluids (water, gatorade/powerade/pedialyte, juices, or teas) to keep your throat moisturized and help further relieve irritation/discomfort.   Your illness is contagious and can be spread to others, especially during the first 3 or 4 days. It cannot be cured by antibiotics or other medicines. Take basic precautions such as washing your hands often, covering your mouth when you cough or sneeze, and avoiding public places where you could spread your illness to others.   Follow-up instructions: Please follow-up with your primary care provider for further evaluation of your symptoms if you are not feeling better within the next 5 days.   Return instructions:   Please return to the Emergency Department if you experience worsening symptoms.  RETURN IMMEDIATELY IF you develop shortness of breath, confusion or altered mental status, a new rash, become dizzy, faint, or poorly responsive, or are unable to be cared for at home. Please return if you have persistent vomiting and cannot keep down fluids or develop a fever that is not controlled by tylenol or motrin.   Please return if you have any other emergent concerns.

## 2023-04-27 NOTE — ED Provider Notes (Signed)
 [facility] AT [facility] Provider Note   CSN: [id number] Arrival date & time: 04/27/23  1121     History  Chief Complaint  Patient presents with   Emesis   Abdominal Pain    Danny Gomez is a 38 y.o. male with no significant past medical history is presenting with concern for nausea, nonbloody vomiting, and nonbloody diarrhea that started yesterday after eating [employer].  He denies any cough, fever, chills.  Did report some mild abdominal pain yesterday with the vomiting that has since resolved.  He does report being on a course of antibiotics for an ear infection last week.  Denies any recent travel.   Emesis      Home Medications Prior to Admission medications   Medication Sig Start Date End Date Taking? Authorizing Provider  ondansetron (ZOFRAN-ODT) 4 MG disintegrating tablet Take 1 tablet (4 mg total) by mouth every 8 (eight) hours as needed for nausea or vomiting. 04/27/23  Yes Rexie Catena, PA-C  acetaminophen (TYLENOL) 650 MG CR tablet Take 1,300 mg by mouth every 8 (eight) hours as needed for pain.    [provider]  allopurinol (ZYLOPRIM) 100 MG tablet Take 1 tablet by mouth once daily 03/22/23   Towanda Fret, MD  amLODipine (NORVASC) 10 MG tablet Take 1 tablet (10 mg total) by mouth daily. 06/29/22   Towanda Fret, MD  citalopram (CELEXA) 10 MG tablet Take 1 tablet (10 mg total) by mouth daily. 03/25/21   Towanda Fret, MD  ergocalciferol (VITAMIN D2) 1.25 MG (50000 UT) capsule Take 1 capsule (50,000 Units total) by mouth once a week. One capsule once weekly 03/26/21   Simpson, Margaret E, MD  fluticasone (FLONASE) 50 MCG/ACT nasal spray Place 2 sprays into both nostrils daily. 04/01/23   Hedges, Susana Enter, PA-C  folic acid (FOLVITE) 400 MCG tablet Take 400 mcg by mouth daily.    [provider]  gabapentin (NEURONTIN) 100 MG capsule Take 1 capsule (100 mg total) by mouth 3 (three) times daily. 03/25/21    Towanda Fret, MD  triamterene-hydrochlorothiazide [facility]) 37.5-25 MG tablet Take 1 tablet by mouth once daily 01/27/23   Towanda Fret, MD      Allergies    Lisinopril    Review of Systems   Review of Systems  Gastrointestinal:  Positive for vomiting.    Physical Exam Updated Vital Signs BP (!) 140/78 (BP Location: Right Arm)   Pulse (!) 103   Temp 99.1 F (37.3 C) (Oral)   Resp 16   Ht 5\' 11"  (1.803 m)   Wt (!) 143.3 kg   SpO2 96%   BMI 44.07 kg/m  Physical Exam Vitals and nursing note reviewed.  Constitutional:      General: He is not in acute distress.    Appearance: He is well-developed.     Comments: Tolerating water at bedside after Zofran  HENT:     Head: Normocephalic and atraumatic.  Eyes:     Conjunctiva/sclera: Conjunctivae normal.  Cardiovascular:     Rate and Rhythm: Normal rate and regular rhythm.     Heart sounds: No murmur heard. Pulmonary:     Effort: Pulmonary effort is normal. No respiratory distress.     Breath sounds: Normal breath sounds.  Abdominal:     Palpations: Abdomen is soft.     Tenderness: There is no abdominal tenderness.     Comments: No rebound or guarding  Musculoskeletal:  General: No swelling.     Cervical back: Neck supple.  Skin:    General: Skin is warm and dry.     Capillary Refill: Capillary refill takes less than 2 seconds.  Neurological:     Mental Status: He is alert.  Psychiatric:        Mood and Affect: Mood normal.     ED Results / Procedures / Treatments   Labs (all labs ordered are listed, but only abnormal results are displayed) Labs Reviewed  RESP PANEL BY RT-PCR (RSV, FLU A&B, COVID)  [id number]    EKG None  Radiology No results found.  Procedures Procedures    Medications Ordered in ED Medications  ondansetron (ZOFRAN-ODT) disintegrating tablet 4 mg (4 mg Oral Given 04/27/23 1222)    ED Course/ Medical Decision Making/ A&P                                 Medical  Decision Making Risk Prescription drug management.     Differential diagnosis includes but is not limited to COVID, flu, RSV, viral URI, strep pharyngitis, viral pharyngitis, allergic rhinitis, pneumonia, bronchitis, c diff, infectious diarrhea   ED Course:  Upon initial evaluation, patient is well-appearing, stable vital signs.  Abdomen soft nontender.  He is drinking water at bedside after receiving Zofran.  Given symptoms started after getting [employer] last night, suspect gastroenteritis. No recent travel to suggest infectious etiology or parasite.  Abdomen soft and nontender, no fevers, passing stool, no previous abdominal surgeries, low concern for acute intra-abdominal pathology.  He did have recent course of antibiotic that was completed about 1 week ago, but diarrhea just started today, lower concern for C. difficile at this time. No cough, fever, lungs clear to auscultation, low concern for pneumonia at this time.   Labs Ordered: I Ordered, and personally interpreted labs.  The pertinent results include:   COVID, flu, RSV negative  Medications Given: 4mg  Zofran ODT      Impression: Nausea, vomiting, diarrhea. Likely gastroenteritis  Disposition:  The patient was discharged home with instructions to take zofran as prescribed. OTC medications as needed for symptom control. Follow up with PCP if symptoms not improved within the next 5 days.  Return precautions given.   This chart was dictated using voice recognition software, Dragon. Despite the best efforts of this provider to proofread and correct errors, errors may still occur which can change documentation meaning.          Final Clinical Impression(s) / ED Diagnoses Final diagnoses:  Nausea vomiting and diarrhea    Rx / DC Orders ED Discharge Orders          Ordered    ondansetron (ZOFRAN-ODT) 4 MG disintegrating tablet  Every 8 hours PRN        04/27/23 1243              Rexie Catena,  PA-C 04/27/23 1252    Russella Courts A, DO 05/02/23 [phone]

## 2023-04-27 NOTE — ED Triage Notes (Signed)
 Pt c/o NV, body aches, HA since last night

## 2023-04-28 ENCOUNTER — Encounter (HOSPITAL_BASED_OUTPATIENT_CLINIC_OR_DEPARTMENT_OTHER): Payer: Self-pay

## 2023-04-28 ENCOUNTER — Other Ambulatory Visit: Payer: Self-pay

## 2023-04-28 ENCOUNTER — Ambulatory Visit: Admitting: Internal Medicine

## 2023-04-28 ENCOUNTER — Encounter: Payer: Self-pay | Admitting: Internal Medicine

## 2023-04-28 ENCOUNTER — Ambulatory Visit: Payer: Self-pay

## 2023-04-28 ENCOUNTER — Ambulatory Visit (HOSPITAL_COMMUNITY)
Admission: RE | Admit: 2023-04-28 | Discharge: 2023-04-28 | Disposition: A | Discharge status: Home / Self Care | Source: Ambulatory Visit | Attending: Internal Medicine | Admitting: Internal Medicine

## 2023-04-28 ENCOUNTER — Emergency Department (HOSPITAL_BASED_OUTPATIENT_CLINIC_OR_DEPARTMENT_OTHER)
Admission: EM | Admit: 2023-04-28 | Discharge: 2023-04-28 | Disposition: A | Discharge status: Home / Self Care | Source: Home / Self Care | Attending: Emergency Medicine | Admitting: Emergency Medicine

## 2023-04-28 ENCOUNTER — Telehealth: Payer: Self-pay

## 2023-04-28 VITALS — BP 134/71 | HR 88 | Ht 71.0 in | Wt 319.8 lb

## 2023-04-28 DIAGNOSIS — D72829 Elevated white blood cell count, unspecified: Secondary | ICD-10-CM | POA: Insufficient documentation

## 2023-04-28 DIAGNOSIS — K529 Noninfective gastroenteritis and colitis, unspecified: Secondary | ICD-10-CM | POA: Insufficient documentation

## 2023-04-28 DIAGNOSIS — L03116 Cellulitis of left lower limb: Secondary | ICD-10-CM | POA: Insufficient documentation

## 2023-04-28 DIAGNOSIS — Z79899 Other long term (current) drug therapy: Secondary | ICD-10-CM | POA: Insufficient documentation

## 2023-04-28 DIAGNOSIS — M7989 Other specified soft tissue disorders: Secondary | ICD-10-CM | POA: Insufficient documentation

## 2023-04-28 DIAGNOSIS — I10 Essential (primary) hypertension: Secondary | ICD-10-CM | POA: Insufficient documentation

## 2023-04-28 DIAGNOSIS — Z7951 Long term (current) use of inhaled steroids: Secondary | ICD-10-CM | POA: Insufficient documentation

## 2023-04-28 DIAGNOSIS — Z86718 Personal history of other venous thrombosis and embolism: Secondary | ICD-10-CM | POA: Insufficient documentation

## 2023-04-28 DIAGNOSIS — J45909 Unspecified asthma, uncomplicated: Secondary | ICD-10-CM | POA: Insufficient documentation

## 2023-04-28 DIAGNOSIS — R519 Headache, unspecified: Secondary | ICD-10-CM | POA: Insufficient documentation

## 2023-04-28 LAB — LACTIC ACID, PLASMA: Lactic Acid, Venous: 1.7 mmol/L (ref 0.5–1.9)

## 2023-04-28 LAB — URINALYSIS, ROUTINE W REFLEX MICROSCOPIC
Bilirubin Urine: NEGATIVE
Glucose, UA: NEGATIVE mg/dL
Ketones, ur: NEGATIVE mg/dL
Leukocytes,Ua: NEGATIVE
Nitrite: NEGATIVE
Protein, ur: 100 mg/dL — AB
Specific Gravity, Urine: 1.025 (ref 1.005–1.030)
pH: 6 (ref 5.0–8.0)

## 2023-04-28 LAB — CBC WITH DIFFERENTIAL/PLATELET
Abs Immature Granulocytes: 0.16 10*3/uL — ABNORMAL HIGH (ref 0.00–0.07)
Basophils Absolute: 0.1 10*3/uL (ref 0.0–0.1)
Basophils Relative: 0 %
Eosinophils Absolute: 0 10*3/uL (ref 0.0–0.5)
Eosinophils Relative: 0 %
HCT: 43.9 % (ref 39.0–52.0)
Hemoglobin: 15.1 g/dL (ref 13.0–17.0)
Immature Granulocytes: 1 %
Lymphocytes Relative: 3 %
Lymphs Abs: 0.7 10*3/uL (ref 0.7–4.0)
MCH: 30.3 pg (ref 26.0–34.0)
MCHC: 34.4 g/dL (ref 30.0–36.0)
MCV: 88 fL (ref 80.0–100.0)
Monocytes Absolute: 0.8 10*3/uL (ref 0.1–1.0)
Monocytes Relative: 4 %
Neutro Abs: 19 10*3/uL — ABNORMAL HIGH (ref 1.7–7.7)
Neutrophils Relative %: 92 %
Platelets: 233 10*3/uL (ref 150–400)
RBC: 4.99 MIL/uL (ref 4.22–5.81)
RDW: 14.1 % (ref 11.5–15.5)
WBC: 20.7 10*3/uL — ABNORMAL HIGH (ref 4.0–10.5)
nRBC: 0 % (ref 0.0–0.2)

## 2023-04-28 LAB — COMPREHENSIVE METABOLIC PANEL WITH GFR
ALT: 40 U/L (ref 0–44)
AST: 76 U/L — ABNORMAL HIGH (ref 15–41)
Albumin: 3.3 g/dL — ABNORMAL LOW (ref 3.5–5.0)
Alkaline Phosphatase: 61 U/L (ref 38–126)
Anion gap: 10 (ref 5–15)
BUN: 10 mg/dL (ref 6–20)
CO2: 26 mmol/L (ref 22–32)
Calcium: 8.9 mg/dL (ref 8.9–10.3)
Chloride: 97 mmol/L — ABNORMAL LOW (ref 98–111)
Creatinine, Ser: 1.04 mg/dL (ref 0.61–1.24)
GFR, Estimated: >60 mL/min (ref >60)
Glucose, Bld: 120 mg/dL — ABNORMAL HIGH (ref 70–99)
Potassium: 3.1 mmol/L — ABNORMAL LOW (ref 3.5–5.1)
Sodium: 133 mmol/L — ABNORMAL LOW (ref 135–145)
Total Bilirubin: 0.7 mg/dL (ref 0.0–1.2)
Total Protein: 7.3 g/dL (ref 6.5–8.1)

## 2023-04-28 LAB — LIPASE, BLOOD: Lipase: 22 U/L (ref 11–51)

## 2023-04-28 LAB — URINALYSIS, MICROSCOPIC (REFLEX)

## 2023-04-28 MED ORDER — SODIUM CHLORIDE 0.9 % IV SOLN
1.0000 g | Freq: Once | INTRAVENOUS | Status: AC
  Administered 2023-04-28: 1 g via INTRAVENOUS
  Filled 2023-04-28: qty 10

## 2023-04-28 MED ORDER — DIPHENHYDRAMINE HCL 50 MG/ML IJ SOLN
12.5000 mg | Freq: Once | INTRAMUSCULAR | Status: AC
  Administered 2023-04-28: 12.5 mg via INTRAVENOUS
  Filled 2023-04-28: qty 1

## 2023-04-28 MED ORDER — ONDANSETRON HCL 4 MG/2ML IJ SOLN
4.0000 mg | Freq: Once | INTRAMUSCULAR | Status: AC
  Administered 2023-04-28: 4 mg via INTRAVENOUS
  Filled 2023-04-28: qty 2

## 2023-04-28 MED ORDER — OXYCODONE HCL 5 MG PO TABS: 5.0000 mg | ORAL_TABLET | ORAL | 0 refills | Status: DC | PRN

## 2023-04-28 MED ORDER — PROCHLORPERAZINE EDISYLATE 10 MG/2ML IJ SOLN
10.0000 mg | Freq: Once | INTRAMUSCULAR | Status: AC
  Administered 2023-04-28: 10 mg via INTRAVENOUS
  Filled 2023-04-28: qty 2

## 2023-04-28 MED ORDER — POTASSIUM CHLORIDE CRYS ER 20 MEQ PO TBCR
40.0000 meq | EXTENDED_RELEASE_TABLET | Freq: Once | ORAL | Status: AC
  Administered 2023-04-28: 40 meq via ORAL
  Filled 2023-04-28: qty 2

## 2023-04-28 MED ORDER — POTASSIUM CHLORIDE CRYS ER 20 MEQ PO TBCR
40.0000 meq | EXTENDED_RELEASE_TABLET | Freq: Once | ORAL | 0 refills | Status: DC
Start: 2023-04-28 — End: 2023-06-30

## 2023-04-28 MED ORDER — SODIUM CHLORIDE 0.9 % IV BOLUS
1000.0000 mL | Freq: Once | INTRAVENOUS | Status: AC
  Administered 2023-04-28: 1000 mL via INTRAVENOUS

## 2023-04-28 MED ORDER — MORPHINE SULFATE (PF) 4 MG/ML IV SOLN
4.0000 mg | Freq: Once | INTRAVENOUS | Status: AC
  Administered 2023-04-28: 4 mg via INTRAVENOUS
  Filled 2023-04-28: qty 1

## 2023-04-28 MED ORDER — SULFAMETHOXAZOLE-TRIMETHOPRIM 800-160 MG PO TABS: 1.0000 | ORAL_TABLET | Freq: Two times a day (BID) | ORAL | 0 refills | Status: DC

## 2023-04-28 NOTE — Discharge Instructions (Addendum)
 Your symptoms were improved after medicines in the emergency department.  You have known cellulitis to your left leg.  We did outline the borders so you can keep track of improvement or progression.  I have sent short course of pain medicine into the pharmacy for you to take for any severe symptoms.  Otherwise take Tylenol 1000 mg every 6 hours, ibuprofen 600 mg every 6 hours.  Return for any worsening or emergent symptoms.  Otherwise follow-up with your primary care doctor in 2 days to ensure that your symptoms are improving.  Your potassium was slightly low.  You received a dose in the emergency department however I have also sent in a supplement for the next 5 days.  This can be rechecked when he follow-up with PCP.

## 2023-04-28 NOTE — Progress Notes (Signed)
 Acute Office Visit  Subjective:    Patient ID: SOMTOCHUKWU WOOLLARD, male    DOB: 04-09-1985, 38 y.o.   MRN: [id number]  Chief Complaint  Patient presents with   Leg Swelling    Pt reports sx of leg swelling and redness started yesterday.    Headache    Reports headache ongoing since yesterday, had some nausea/vomiting over the weekend.     HPI Patient is in today for complaint of left leg swelling and redness that started suddenly last evening.  He has mild discomfort, but denies overt pain.  Denies any episode of immobilization recently.  Denies dyspnea, chest pain, or palpitations. he had an acute DVT in 2021, which was unprovoked.  He had completed Xarelto for 3 months.  He had hematology evaluation after it, but preferred to avoid lifelong AC at that time.  He also reports nausea, vomiting for the last 2 days.  He had [place] [employer] sub yesterday, after which he had nausea, vomiting and headache.  He had diarrhea about 3 weeks ago, when he had to take clindamycin for your infection.  Denies any fever or chills currently.  Past Medical History:  Diagnosis Date   Asthma    pt. reports as a child he used an inhaler , but asthma has resoled in the adult path   Febrile illness    Headache(784.0)    Hypertension    Neuromuscular disorder (HCC)    hnp- L4-5, L5-S1   Obesity     Past Surgical History:  Procedure Laterality Date   right ankle surgery  2002   bones had fused but it was corrected surgically   TONSILLECTOMY      Family History  Problem Relation Age of Onset   Hypertension Mother    Obesity Mother     Social History   Socioeconomic History   Marital status: Single    Spouse name: Not on file   Number of children: 0   Years of education: College gr   Highest education level: Not on file  Occupational History   Occupation: Employed   Tobacco Use   Smoking status: Never   Smokeless tobacco: Never  Vaping Use   Vaping status: Never Used  Substance and  Sexual Activity   Alcohol use: No    Comment: socially- monthly    Drug use: Yes    Types: Marijuana    Comment: once per day    Sexual activity: Not Currently  Other Topics Concern   Not on file  Social History Narrative   Living with Grandparents    Social Drivers of Health   Financial Resource Strain: Not on file  Food Insecurity: Not on file  Transportation Needs: Not on file  Physical Activity: Not on file  Stress: Not on file  Social Connections: Not on file  Intimate Partner Violence: Not on file    Outpatient Medications Prior to Visit  Medication Sig Dispense Refill   acetaminophen (TYLENOL) 650 MG CR tablet Take 1,300 mg by mouth every 8 (eight) hours as needed for pain.     allopurinol (ZYLOPRIM) 100 MG tablet Take 1 tablet by mouth once daily 30 tablet 0   amLODipine (NORVASC) 10 MG tablet Take 1 tablet (10 mg total) by mouth daily. 90 tablet 3   citalopram (CELEXA) 10 MG tablet Take 1 tablet (10 mg total) by mouth daily. 30 tablet 3   ergocalciferol (VITAMIN D2) 1.25 MG (50000 UT) capsule Take 1 capsule (50,000 Units total)  by mouth once a week. One capsule once weekly 12 capsule 3   fluticasone (FLONASE) 50 MCG/ACT nasal spray Place 2 sprays into both nostrils daily. 16 g 6   folic acid (FOLVITE) 400 MCG tablet Take 400 mcg by mouth daily.     gabapentin (NEURONTIN) 100 MG capsule Take 1 capsule (100 mg total) by mouth 3 (three) times daily. 30 capsule 3   ondansetron (ZOFRAN-ODT) 4 MG disintegrating tablet Take 1 tablet (4 mg total) by mouth every 8 (eight) hours as needed for nausea or vomiting. 20 tablet 0   triamterene-hydrochlorothiazide (MAXZIDE-25) 37.5-25 MG tablet Take 1 tablet by mouth once daily 90 tablet 0   No facility-administered medications prior to visit.    Allergies  Allergen Reactions   Lisinopril Cough    Review of Systems  Constitutional:  Negative for chills and fever.  HENT:  Negative for congestion and sore throat.   Eyes:   Negative for pain and discharge.  Respiratory:  Negative for cough and shortness of breath.   Cardiovascular:  Positive for leg swelling. Negative for chest pain and palpitations.  Gastrointestinal:  Negative for constipation, diarrhea, nausea and vomiting.  Endocrine: Negative for polydipsia and polyuria.  Genitourinary:  Negative for dysuria and hematuria.  Musculoskeletal:  Negative for neck pain and neck stiffness.  Skin:  Positive for color change (Erythema of left leg). Negative for rash.  Neurological:  Positive for headaches. Negative for weakness and numbness.  Psychiatric/Behavioral:  Negative for agitation and behavioral problems.        Objective:    Physical Exam Vitals reviewed.  Constitutional:      General: He is not in acute distress.    Appearance: He is not diaphoretic.  HENT:     Head: Normocephalic and atraumatic.     Nose: Nose normal.     Mouth/Throat:     Mouth: Mucous membranes are moist.  Eyes:     General: No scleral icterus.    Extraocular Movements: Extraocular movements intact.  Cardiovascular:     Rate and Rhythm: Normal rate and regular rhythm.     Heart sounds: Normal heart sounds. No murmur heard. Pulmonary:     Breath sounds: Normal breath sounds. No wheezing or rales.  Musculoskeletal:     Cervical back: Neck supple. No tenderness.     Right lower leg: No edema.     Left lower leg: Edema (With erythema and warmth) present.     Comments: Denna Haggard' sign negative  Skin:    General: Skin is warm.     Findings: No rash.  Neurological:     General: No focal deficit present.     Mental Status: He is alert and oriented to person, place, and time.  Psychiatric:        Mood and Affect: Mood normal.        Behavior: Behavior normal.     BP 134/71   Pulse 88   Ht 5\' 11"  (1.803 m)   Wt (!) 319 lb 12.8 oz (145.1 kg)   SpO2 95%   BMI 44.60 kg/m  Wt Readings from Last 3 Encounters:  04/28/23 (!) 319 lb 12.8 oz (145.1 kg)  04/27/23 (!) 316  lb (143.3 kg)  03/28/23 (!) 324 lb 1.2 oz (147 kg)        Assessment & Plan:   Problem List Items Addressed This Visit       Digestive   Acute gastroenteritis   Nausea and vomiting likely due to  acute gastroenteritis Zofran as needed for nausea Maintain adequate hydration Recent ER visit chart reviewed Most of the gastroenteritis are self resolving - if symptoms persistent after 1 week, can consider antibiotic If recurrence of diarrhea, will obtain GI stool profile        Other   Left leg swelling - Primary   Unilateral leg swelling, warmth and erythema suspicious of DVT He has history of left leg DVT, which was unprovoked as well Check [place] RLE STAT      Relevant Orders   US Venous Img Lower Unilateral Left   History of DVT (deep vein thrombosis)   History of unprovoked DVT in the past, check [place] RLE today as he has left leg swelling Had taken Xarelto in 2021      Relevant Orders   US Venous Img Lower Unilateral Left     No orders of the defined types were placed in this encounter.    Anabel Halon, MD

## 2023-04-28 NOTE — Telephone Encounter (Signed)
 Chief Complaint: Left leg swelling, redness, pain down inner thigh Symptoms: see above Frequency: since yesterday Pertinent Negatives: Patient denies chest pain, difficulty breathing Disposition: [] ED /[] Urgent Care (no appt availability in office) / [x] Appointment(In office/virtual)/ []  [facility]/ [] Home Care/ [] Refused Recommended Disposition /[] [facility]/ []  Follow-up with PCP Additional Notes: Patient's grandmother initially called in and connected to patient to report symptoms since yesterday of painful, red, mild swollen left leg. Patient states he was in the ER yesterday for a stomach bug and mentioned to them he is having pain down the inner thigh but they did not do any diagnostics to rule out blood clot. Patient has a history of DVT that has dissolved. Patient denies chest pain or difficulty breathing. Appt made for this morning with HCP. Patient advised to call 911 if he has sudden shortness of breath, difficulty breathing, or chest pain. Patient verbalized understanding.    Copied from CRM [id number]. Topic: Clinical - Red Word Triage >> Apr 28, 2023  7:36 AM Deaijah H wrote: Red Word that prompted transfer to Nurse Triage: Leg red, swollen and hurt real bad to stand on it Reason for Disposition  [1] Thigh or calf pain AND [2] only 1 side AND [3] present > 1 hour  Answer Assessment - Initial Assessment Questions 1. ONSET: "When did the swelling start?" (e.g., minutes, hours, days)     Yesterday  2. LOCATION: "What part of the leg is swollen?"  "Are both legs swollen or just one leg?"     Left leg 3. SEVERITY: "How bad is the swelling?" (e.g., localized; mild, moderate, severe)   - Localized: Small area of swelling localized to one leg.   - MILD pedal edema: Swelling limited to foot and ankle, pitting edema < 1/4 inch (6 mm) deep, rest and elevation eliminate most or all swelling.   - MODERATE edema: Swelling of lower leg to knee, pitting edema > 1/4 inch (6  mm) deep, rest and elevation only partially reduce swelling.   - SEVERE edema: Swelling extends above knee, facial or hand swelling present.      Very minimal 4. REDNESS: "Does the swelling look red or infected?"     Mild redness 5. PAIN: "Is the swelling painful to touch?" If Yes, ask: "How painful is it?"   (Scale 1-10; mild, moderate or severe)     Painful when walking, painful to the touch, currently at a 6 6. FEVER: "Do you have a fever?" If Yes, ask: "What is it, how was it measured, and when did it start?"      Over 99 7. CAUSE: "What do you think is causing the leg swelling?"     Patient unsure 8. MEDICAL HISTORY: "Do you have a history of blood clots (e.g., DVT), cancer, heart failure, kidney disease, or liver failure?"     Hx of blood clots in leg 9. RECURRENT SYMPTOM: "Have you had leg swelling before?" If Yes, ask: "When was the last time?" "What happened that time?"     No 10. OTHER SYMPTOMS: "Do you have any other symptoms?" (e.g., chest pain, difficulty breathing)       Pain down inner thigh, cold sweats  Protocols used: Leg Swelling and Edema-A-AH

## 2023-04-28 NOTE — Assessment & Plan Note (Addendum)
 Nausea and vomiting likely due to acute gastroenteritis Zofran as needed for nausea Maintain adequate hydration Recent ER visit chart reviewed Most of the gastroenteritis are self resolving - if symptoms persistent after 1 week, can consider antibiotic If recurrence of diarrhea, will obtain GI stool profile

## 2023-04-28 NOTE — Addendum Note (Signed)
 Addended byTrena Platt on: 04/28/2023 12:49 PM   Modules accepted: Orders

## 2023-04-28 NOTE — Telephone Encounter (Signed)
 Copied from CRM [id number]. Topic: Clinical - Request for Lab/Test Order >> Apr 28, 2023 12:58 PM Danny Gomez wrote: Reason for CRM: Patient requesting a callback to review U/Gomez results.

## 2023-04-28 NOTE — Patient Instructions (Signed)
 Please get [place] of left leg as scheduled.

## 2023-04-28 NOTE — ED Triage Notes (Signed)
 Pt. States was here yesterday. Dx with cellulitis in left leg.having chills N/V/D that intensified. Took 2 doses of abx today

## 2023-04-28 NOTE — Assessment & Plan Note (Signed)
 Unilateral leg swelling, warmth and erythema suspicious of DVT He has history of left leg DVT, which was unprovoked as well Check [place] RLE STAT

## 2023-04-28 NOTE — Telephone Encounter (Signed)
 Copied from CRM [id number]. Topic: Referral - Question >> Apr 28, 2023 11:22 AM Nyra Capes wrote: Reason for CRM: patient calling in from [facility] radiology department. Provider sent patient to facility to get [place] Radiology is asking for a prior authorization for procedure

## 2023-04-28 NOTE — ED Provider Notes (Signed)
 [facility] Provider Note   CSN: [id number] Arrival date & time: 04/28/23  1956     History  Chief Complaint  Patient presents with   multiple complaints    Danny Gomez is a 38 y.o. male.  38 year old male presents today for concern of nausea and vomiting.  He was seen yesterday for gastroenteritis.  He also had a visit today with his PCP where he was seen for left lower extremity swelling and pain.  He had a DVT study done due to his history of unprovoked DVT.  DVT study was negative.  He was diagnosed with cellulitis and started on Bactrim.  He has had both doses of Bactrim today.  Denies any fever.  He is mainly here for headache, persistent vomiting, decreased p.o. intake.  Denies fever or other concerns.  The history is provided by the patient. No language interpreter was used.       Home Medications Prior to Admission medications   Medication Sig Start Date End Date Taking? Authorizing Provider  acetaminophen (TYLENOL) 650 MG CR tablet Take 1,300 mg by mouth every 8 (eight) hours as needed for pain.    [provider]  allopurinol (ZYLOPRIM) 100 MG tablet Take 1 tablet by mouth once daily 03/22/23   Kerri Perches, MD  amLODipine (NORVASC) 10 MG tablet Take 1 tablet (10 mg total) by mouth daily. 06/29/22   Kerri Perches, MD  citalopram (CELEXA) 10 MG tablet Take 1 tablet (10 mg total) by mouth daily. 03/25/21   Kerri Perches, MD  ergocalciferol (VITAMIN D2) 1.25 MG (50000 UT) capsule Take 1 capsule (50,000 Units total) by mouth once a week. One capsule once weekly 03/26/21   Kerri Perches, MD  fluticasone [facility]) 50 MCG/ACT nasal spray Place 2 sprays into both nostrils daily. 04/01/23   Hedges, Tinnie Gens, PA-C  folic acid (FOLVITE) 400 MCG tablet Take 400 mcg by mouth daily.    [provider]  gabapentin (NEURONTIN) 100 MG capsule Take 1 capsule (100 mg total) by mouth 3 (three) times daily.  03/25/21   Kerri Perches, MD  ondansetron (ZOFRAN-ODT) 4 MG disintegrating tablet Take 1 tablet (4 mg total) by mouth every 8 (eight) hours as needed for nausea or vomiting. 04/27/23   Arabella Merles, PA-C  sulfamethoxazole-trimethoprim (BACTRIM DS) 800-160 MG tablet Take 1 tablet by mouth 2 (two) times daily. 04/28/23   Anabel Halon, MD  triamterene-hydrochlorothiazide [facility]) 37.5-25 MG tablet Take 1 tablet by mouth once daily 01/27/23   Kerri Perches, MD      Allergies    Lisinopril    Review of Systems   Review of Systems  Constitutional:  Negative for chills and fever.  Eyes:  Negative for photophobia and visual disturbance.  Respiratory:  Negative for shortness of breath.   Cardiovascular:  Positive for leg swelling. Negative for chest pain.  Gastrointestinal:  Positive for nausea and vomiting. Negative for abdominal pain.  Neurological:  Positive for headaches. Negative for light-headedness.  All other systems reviewed and are negative.   Physical Exam Updated Vital Signs BP (!) 143/83 (BP Location: Left Arm)   Pulse (!) 120   Temp 99.7 F (37.6 C) (Oral)   Resp 20   Ht 5\' 11"  (1.803 m)   Wt (!) 145.1 kg   SpO2 94%   BMI 44.62 kg/m  Physical Exam Vitals and nursing note reviewed.  Constitutional:      General: He  is not in acute distress.    Appearance: Normal appearance. He is not ill-appearing.  HENT:     Head: Normocephalic and atraumatic.     Nose: Nose normal.  Eyes:     General: No scleral icterus.    Extraocular Movements: Extraocular movements intact.     Conjunctiva/sclera: Conjunctivae normal.  Cardiovascular:     Rate and Rhythm: Normal rate and regular rhythm.  Pulmonary:     Effort: Pulmonary effort is normal. No respiratory distress.     Breath sounds: Normal breath sounds. No wheezing or rales.  Abdominal:     General: There is no distension.     Tenderness: There is no abdominal tenderness.  Musculoskeletal:        General:  Normal range of motion.     Cervical back: Normal range of motion.     Right lower leg: No edema.     Left lower leg: Edema (Warmth and erythema present) present.  Skin:    General: Skin is warm and dry.  Neurological:     General: No focal deficit present.     Mental Status: He is alert. Mental status is at baseline.     ED Results / Procedures / Treatments   Labs (all labs ordered are listed, but only abnormal results are displayed) Labs Reviewed  CULTURE, BLOOD (ROUTINE X 2)  CULTURE, BLOOD (ROUTINE X 2)  CBC WITH DIFFERENTIAL/PLATELET  COMPREHENSIVE METABOLIC PANEL WITH GFR  LIPASE, BLOOD  URINALYSIS, ROUTINE W REFLEX MICROSCOPIC  LACTIC ACID, PLASMA  LACTIC ACID, PLASMA    EKG None  Radiology US Venous Img Lower Unilateral Left Result Date: 04/28/2023 CLINICAL DATA:  Left lower extremity swelling edema EXAM: LEFT LOWER EXTREMITY VENOUS DOPPLER ULTRASOUND TECHNIQUE: Gray-scale sonography with compression, as well as color and duplex ultrasound, were performed to evaluate the deep venous system(s) from the level of the common femoral vein through the popliteal and proximal calf veins. COMPARISON:  None available FINDINGS: VENOUS Normal compressibility of the common femoral, superficial femoral, and popliteal veins, as well as the visualized calf veins. Visualized portions of profunda femoral vein and great saphenous vein unremarkable. No filling defects to suggest DVT on grayscale or color Doppler imaging. Doppler waveforms show normal direction of venous flow, normal respiratory plasticity and response to augmentation. Limited views of the contralateral common femoral vein are unremarkable. OTHER Mildly enlarged lymph nodes seen in the left upper medial thigh measuring 1.3 cm in short axis Limitations: none IMPRESSION: 1. No DVT of the left lower extremity. 2. Nonspecific mildly enlarged left medial thigh lymph node, most likely reactive. Recommend follow-up ultrasound in 10-12  weeks to document resolution. Electronically Signed   By: Acquanetta Belling M.D.   On: 04/28/2023 12:29    Procedures Procedures    Medications Ordered in ED Medications  cefTRIAXone (ROCEPHIN) 1 g in sodium chloride 0.9 % 100 mL IVPB (1 g Intravenous New Bag/Given 04/28/23 2104)  ondansetron (ZOFRAN) injection 4 mg (4 mg Intravenous Given 04/28/23 2056)  morphine (PF) 4 MG/ML injection 4 mg (4 mg Intravenous Given 04/28/23 2057)  sodium chloride 0.9 % bolus 1,000 mL (1,000 mLs Intravenous New Bag/Given 04/28/23 2059)    ED Course/ Medical Decision Making/ A&P                                 Medical Decision Making Amount and/or Complexity of Data Reviewed Labs: ordered.  Risk Prescription drug  management.   Medical Decision Making / ED Course   This patient presents to the ED for concern of vomiting, headache, cellulitis, this involves an extensive number of treatment options, and is a complaint that carries with it a high risk of complications and morbidity.  The differential diagnosis includes cellulitis, sepsis, gastroenteritis, migraine headache  MDM: 38 year old male presents today for concern of nausea and vomiting.  He was seen yesterday for gastroenteritis.  He was seen today by his PCP for lower extremity redness and swelling.  DVT study was negative.  He has history of unprovoked DVT. CBC with leukocytosis of 20,000 with left shift.  He does have known cellulitis.  He is compliant with his Bactrim that he was prescribed today.  Has taken 2 doses.  He was given Rocephin in the emergency department today.  Potassium of 3.1 on CMP otherwise without acute concern.  Potassium repletion given.  Also prescribe the next 5 days worth.  He will have this repeated by his PCP. UA without evidence of UTI.  Lipase within normal.  Lactic acid within normal.  Blood cultures obtained. Symptoms improved on reevaluation. Discussed discharge with close outpatient follow-up versus  admission. Ultimately patient decides to discharge with close follow-up.  Advised nurse to mark the cellulitic borders for follow-up. Discharged in stable condition.  Return precaution discussed.  Patient voices understanding and is in agreement with plan.  Wife at bedside and is also in agreement.   Additional history obtained: -Additional history obtained from wife at bedside, PCP note from earlier today -External records from outside source obtained and reviewed including: Chart review including previous notes, labs, imaging, consultation notes   Lab Tests: -I ordered, reviewed, and interpreted labs.   The pertinent results include:   Labs Reviewed  CBC WITH DIFFERENTIAL/PLATELET - Abnormal; Notable for the following components:      Result Value   WBC 20.7 (*)    Neutro Abs 19.0 (*)    Abs Immature Granulocytes 0.16 (*)    All other components within normal limits  COMPREHENSIVE METABOLIC PANEL WITH GFR - Abnormal; Notable for the following components:   Sodium 133 (*)    Potassium 3.1 (*)    Chloride 97 (*)    Glucose, Bld 120 (*)    Albumin 3.3 (*)    AST 76 (*)    All other components within normal limits  URINALYSIS, ROUTINE W REFLEX MICROSCOPIC - Abnormal; Notable for the following components:   Hgb urine dipstick LARGE (*)    Protein, ur 100 (*)    All other components within normal limits  URINALYSIS, MICROSCOPIC (REFLEX) - Abnormal; Notable for the following components:   Bacteria, UA FEW (*)    All other components within normal limits  CULTURE, BLOOD (ROUTINE X 2)  CULTURE, BLOOD (ROUTINE X 2)  LIPASE, BLOOD  LACTIC ACID, PLASMA      EKG  EKG Interpretation Date/Time:    Ventricular Rate:    PR Interval:    QRS Duration:    QT Interval:    QTC Calculation:   R Axis:      Text Interpretation:         Medicines ordered and prescription drug management: Meds ordered this encounter  Medications   cefTRIAXone (ROCEPHIN) 1 g in sodium chloride 0.9 %  100 mL IVPB    Antibiotic Indication::   Cellulitis   ondansetron (ZOFRAN) injection 4 mg   morphine (PF) 4 MG/ML injection 4 mg   sodium chloride 0.9 %  bolus 1,000 mL   oxyCODONE (ROXICODONE) 5 MG immediate release tablet    Sig: Take 1 tablet (5 mg total) by mouth every 4 (four) hours as needed for severe pain (pain score 7-10).    Dispense:  10 tablet    Refill:  0    Supervising Provider:   Hyacinth Meeker, BRIAN [[id number]]   potassium chloride SA (KLOR-CON M) CR tablet 40 mEq   prochlorperazine (COMPAZINE) injection 10 mg   diphenhydrAMINE (BENADRYL) injection 12.5 mg   potassium chloride SA (KLOR-CON M) 20 MEQ tablet    Sig: Take 2 tablets (40 mEq total) by mouth once for 1 dose.    Dispense:  10 tablet    Refill:  0    Supervising Provider:   Eber Hong [[id number]]    -I have reviewed the patients home medicines and have made adjustments as needed  Social Determinants of Health:  Factors impacting patients care include: Good family support, good follow-up   Reevaluation: After the interventions noted above, I reevaluated the patient and found that they have :improved  Co morbidities that complicate the patient evaluation  Past Medical History:  Diagnosis Date   Asthma    pt. 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  Return precaution discussed.  Patient voices understanding and is in agreement with the plan.   Final Clinical Impression(s) / ED Diagnoses Final diagnoses:  Cellulitis of left lower extremity  Nonintractable headache, unspecified chronicity pattern, unspecified headache type  Gastroenteritis    Rx / DC Orders ED Discharge Orders          Ordered    oxyCODONE (ROXICODONE) 5 MG immediate release tablet  Every 4 hours PRN        04/28/23 2307     potassium chloride SA (KLOR-CON M) 20 MEQ tablet   Once        04/28/23 2309              Marita [place], PA-C 04/28/23 2315    Linwood Dibbles, MD 04/29/23 [phone]

## 2023-04-28 NOTE — Telephone Encounter (Signed)
 Done by Loistine Simas

## 2023-04-28 NOTE — Assessment & Plan Note (Addendum)
 History of unprovoked DVT in the past, check [place] RLE today as he has left leg swelling Had taken Xarelto in 2021

## 2023-04-29 ENCOUNTER — Inpatient Hospital Stay (HOSPITAL_COMMUNITY)
Admission: EM | Admit: 2023-04-29 | Discharge: 2023-05-02 | DRG: 603 | Disposition: A | Discharge status: Home / Self Care | Attending: Internal Medicine | Admitting: Internal Medicine

## 2023-04-29 ENCOUNTER — Other Ambulatory Visit: Payer: Self-pay

## 2023-04-29 ENCOUNTER — Encounter (HOSPITAL_COMMUNITY): Payer: Self-pay | Admitting: Emergency Medicine

## 2023-04-29 ENCOUNTER — Ambulatory Visit: Payer: Self-pay

## 2023-04-29 DIAGNOSIS — M7989 Other specified soft tissue disorders: Secondary | ICD-10-CM | POA: Diagnosis present

## 2023-04-29 DIAGNOSIS — L03116 Cellulitis of left lower limb: Principal | ICD-10-CM | POA: Diagnosis present

## 2023-04-29 DIAGNOSIS — Z1152 Encounter for screening for COVID-19: Secondary | ICD-10-CM

## 2023-04-29 DIAGNOSIS — Z79899 Other long term (current) drug therapy: Secondary | ICD-10-CM

## 2023-04-29 DIAGNOSIS — Z86718 Personal history of other venous thrombosis and embolism: Secondary | ICD-10-CM

## 2023-04-29 DIAGNOSIS — F411 Generalized anxiety disorder: Secondary | ICD-10-CM | POA: Diagnosis present

## 2023-04-29 DIAGNOSIS — I10 Essential (primary) hypertension: Secondary | ICD-10-CM | POA: Diagnosis present

## 2023-04-29 DIAGNOSIS — Z888 Allergy status to other drugs, medicaments and biological substances status: Secondary | ICD-10-CM

## 2023-04-29 DIAGNOSIS — E871 Hypo-osmolality and hyponatremia: Secondary | ICD-10-CM

## 2023-04-29 DIAGNOSIS — Z8249 Family history of ischemic heart disease and other diseases of the circulatory system: Secondary | ICD-10-CM

## 2023-04-29 DIAGNOSIS — M109 Gout, unspecified: Secondary | ICD-10-CM | POA: Insufficient documentation

## 2023-04-29 DIAGNOSIS — R519 Headache, unspecified: Secondary | ICD-10-CM | POA: Diagnosis present

## 2023-04-29 DIAGNOSIS — Z6841 Body Mass Index (BMI) 40.0 and over, adult: Secondary | ICD-10-CM

## 2023-04-29 DIAGNOSIS — E876 Hypokalemia: Secondary | ICD-10-CM

## 2023-04-29 DIAGNOSIS — K529 Noninfective gastroenteritis and colitis, unspecified: Secondary | ICD-10-CM | POA: Diagnosis present

## 2023-04-29 DIAGNOSIS — J45909 Unspecified asthma, uncomplicated: Secondary | ICD-10-CM | POA: Insufficient documentation

## 2023-04-29 DIAGNOSIS — R7401 Elevation of levels of liver transaminase levels: Secondary | ICD-10-CM | POA: Diagnosis present

## 2023-04-29 DIAGNOSIS — G629 Polyneuropathy, unspecified: Secondary | ICD-10-CM

## 2023-04-29 DIAGNOSIS — E669 Obesity, unspecified: Secondary | ICD-10-CM | POA: Diagnosis present

## 2023-04-29 LAB — COMPREHENSIVE METABOLIC PANEL WITH GFR
ALT: 53 U/L — ABNORMAL HIGH (ref 0–44)
AST: 143 U/L — ABNORMAL HIGH (ref 15–41)
Albumin: 3.1 g/dL — ABNORMAL LOW (ref 3.5–5.0)
Alkaline Phosphatase: 68 U/L (ref 38–126)
Anion gap: 11 (ref 5–15)
BUN: 7 mg/dL (ref 6–20)
CO2: 26 mmol/L (ref 22–32)
Calcium: 8.7 mg/dL — ABNORMAL LOW (ref 8.9–10.3)
Chloride: 94 mmol/L — ABNORMAL LOW (ref 98–111)
Creatinine, Ser: 1.16 mg/dL (ref 0.61–1.24)
GFR, Estimated: >60 mL/min (ref >60)
Glucose, Bld: 106 mg/dL — ABNORMAL HIGH (ref 70–99)
Potassium: 3.1 mmol/L — ABNORMAL LOW (ref 3.5–5.1)
Sodium: 131 mmol/L — ABNORMAL LOW (ref 135–145)
Total Bilirubin: 0.7 mg/dL (ref 0.0–1.2)
Total Protein: 7.3 g/dL (ref 6.5–8.1)

## 2023-04-29 LAB — CBC WITH DIFFERENTIAL/PLATELET
Abs Immature Granulocytes: 0.08 10*3/uL — ABNORMAL HIGH (ref 0.00–0.07)
Basophils Absolute: 0.1 10*3/uL (ref 0.0–0.1)
Basophils Relative: 0 %
Eosinophils Absolute: 0.1 10*3/uL (ref 0.0–0.5)
Eosinophils Relative: 0 %
HCT: 45.5 % (ref 39.0–52.0)
Hemoglobin: 15.2 g/dL (ref 13.0–17.0)
Immature Granulocytes: 1 %
Lymphocytes Relative: 7 %
Lymphs Abs: 1.3 10*3/uL (ref 0.7–4.0)
MCH: 29.7 pg (ref 26.0–34.0)
MCHC: 33.4 g/dL (ref 30.0–36.0)
MCV: 88.9 fL (ref 80.0–100.0)
Monocytes Absolute: 1.5 10*3/uL — ABNORMAL HIGH (ref 0.1–1.0)
Monocytes Relative: 8 %
Neutro Abs: 14.8 10*3/uL — ABNORMAL HIGH (ref 1.7–7.7)
Neutrophils Relative %: 84 %
Platelets: 233 10*3/uL (ref 150–400)
RBC: 5.12 MIL/uL (ref 4.22–5.81)
RDW: 14.3 % (ref 11.5–15.5)
WBC: 17.7 10*3/uL — ABNORMAL HIGH (ref 4.0–10.5)
nRBC: 0 % (ref 0.0–0.2)

## 2023-04-29 LAB — I-STAT CG4 LACTIC ACID, ED: Lactic Acid, Venous: 1.4 mmol/L (ref 0.5–1.9)

## 2023-04-29 MED ORDER — ACETAMINOPHEN 325 MG PO TABS
650.0000 mg | ORAL_TABLET | Freq: Once | ORAL | Status: AC
  Administered 2023-04-29: 650 mg via ORAL
  Filled 2023-04-29: qty 2

## 2023-04-29 NOTE — Telephone Encounter (Signed)
 This is not our patient please send to family medicine, we are [facility]

## 2023-04-29 NOTE — ED Notes (Signed)
Patient reevaluated by PA at triage .  

## 2023-04-29 NOTE — Telephone Encounter (Signed)
 Copied from CRM [id number]. Topic: Clinical - Red Word Triage >> Apr 29, 2023  3:44 PM Bruna Potter N wrote: [facility] that prompted transfer to Nurse Triage: appt has been spiking fever back to back and is wanting to speak to nurse  Chief Complaint: fever Symptoms: leg cellulitis Frequency: for three days Pertinent Negatives: Patient denies cp, sob Disposition: [] ED /[] Urgent Care (no appt availability in office) / [x] Appointment(In office/virtual)/ []  [facility]/ [] Home Care/ [] Refused Recommended Disposition /[] [facility]/ []  Follow-up with PCP Additional Notes: fever; states has been evaluated in the ER; apt made for tomorrow; care advice given, denies questions; instructed to go to ER if becomes worse.   Reason for Disposition  Fever present > 3 days (72 hours)  Answer Assessment - Initial Assessment Questions 1. TEMPERATURE: "What is the most recent temperature?"  "How was it measured?"      102.7 2. ONSET: "When did the fever start?"      3 days 3. CHILLS: "Do you have chills?" If yes: "How bad are they?"  (e.g., none, mild, moderate, severe)   - NONE: no chills   - MILD: feeling cold   - MODERATE: feeling very cold, some shivering (feels better under a thick blanket)   - SEVERE: feeling extremely cold with shaking chills (general body shaking, rigors; even under a thick blanket)      severe 4. OTHER SYMPTOMS: "Do you have any other symptoms besides the fever?"  (e.g., abdomen pain, cough, diarrhea, earache, headache, sore throat, urination pain)     Headache, body aches 5. CAUSE: If there are no symptoms, ask: "What do you think is causing the fever?"      Infection in leg; has cellulitis 6. CONTACTS: "Does anyone else in the family have an infection?"     na 7. TREATMENT: "What have you done so far to treat this fever?" (e.g., medications)     tylenol 8. IMMUNOCOMPROMISE: "Do you have of the following: diabetes, HIV positive, splenectomy, cancer  chemotherapy, chronic steroid treatment, transplant patient, etc."     no 9. PREGNANCY: "Is there any chance you are pregnant?" "When was your last menstrual period?"     na 10. TRAVEL: "Have you traveled out of the country in the last month?" (e.g., travel history, exposures)       na  Protocols used: [facility]

## 2023-04-29 NOTE — ED Notes (Signed)
 1st lac in normal range 1.4, 2nd not needed and can be canceled

## 2023-04-29 NOTE — ED Provider Triage Note (Signed)
 Emergency Medicine Provider Triage Evaluation Note  Danny Gomez , a 38 y.o. male  was evaluated in triage.  Pt complains of leg infection and fevers. He was seen in the ED yesterday and discharged with antibiotics. Concerned that symptoms not improving and continues to have worsening fevers. Denies any chest pain, shortness of breath, or other symptoms.  Review of Systems  Positive: As above Negative: As above  Physical Exam  BP 137/73 (BP Location: Left Arm)   Pulse 97   Temp 99 F (37.2 C)   Resp 18   Ht 5\' 11"  (1.803 m)   Wt (!) 145 kg   SpO2 95%   BMI 44.58 kg/m  Gen:   Awake, no distress   Resp:  Normal effort  MSK:   Moves extremities without difficulty  Other:  Significant left lower leg edema with erythema and induration. No drainage present. Area marked yesterday with no significant change size of cellulitic skin since then.  Medical Decision Making  Medically screening exam initiated at 6:57 PM.  Appropriate orders placed.  Danny Gomez was informed that the remainder of the evaluation will be completed by another provider, this initial triage assessment does not replace that evaluation, and the importance of remaining in the ED until their evaluation is complete.     Smitty Knudsen, PA-C 04/29/23 1859

## 2023-04-29 NOTE — ED Triage Notes (Signed)
 Pt reports fever for a few days. Also waiting on blood culture results. Seen recently for cellulitis and on abx. Last took tylenol 3 hours ago.

## 2023-04-30 ENCOUNTER — Encounter (HOSPITAL_COMMUNITY): Payer: Self-pay | Admitting: Internal Medicine

## 2023-04-30 ENCOUNTER — Ambulatory Visit: Payer: Self-pay

## 2023-04-30 DIAGNOSIS — L03116 Cellulitis of left lower limb: Secondary | ICD-10-CM

## 2023-04-30 DIAGNOSIS — E871 Hypo-osmolality and hyponatremia: Secondary | ICD-10-CM | POA: Diagnosis present

## 2023-04-30 DIAGNOSIS — Z79899 Other long term (current) drug therapy: Secondary | ICD-10-CM | POA: Diagnosis not present

## 2023-04-30 DIAGNOSIS — F411 Generalized anxiety disorder: Secondary | ICD-10-CM | POA: Diagnosis present

## 2023-04-30 DIAGNOSIS — Z888 Allergy status to other drugs, medicaments and biological substances status: Secondary | ICD-10-CM | POA: Diagnosis not present

## 2023-04-30 DIAGNOSIS — E876 Hypokalemia: Secondary | ICD-10-CM | POA: Diagnosis present

## 2023-04-30 DIAGNOSIS — E669 Obesity, unspecified: Secondary | ICD-10-CM | POA: Diagnosis present

## 2023-04-30 DIAGNOSIS — I10 Essential (primary) hypertension: Secondary | ICD-10-CM | POA: Diagnosis present

## 2023-04-30 DIAGNOSIS — J45909 Unspecified asthma, uncomplicated: Secondary | ICD-10-CM | POA: Diagnosis present

## 2023-04-30 DIAGNOSIS — G629 Polyneuropathy, unspecified: Secondary | ICD-10-CM

## 2023-04-30 DIAGNOSIS — Z86718 Personal history of other venous thrombosis and embolism: Secondary | ICD-10-CM | POA: Diagnosis not present

## 2023-04-30 DIAGNOSIS — Z1152 Encounter for screening for COVID-19: Secondary | ICD-10-CM | POA: Diagnosis not present

## 2023-04-30 DIAGNOSIS — M109 Gout, unspecified: Secondary | ICD-10-CM | POA: Diagnosis present

## 2023-04-30 DIAGNOSIS — Z6841 Body Mass Index (BMI) 40.0 and over, adult: Secondary | ICD-10-CM | POA: Diagnosis not present

## 2023-04-30 DIAGNOSIS — M7989 Other specified soft tissue disorders: Secondary | ICD-10-CM | POA: Diagnosis present

## 2023-04-30 DIAGNOSIS — K529 Noninfective gastroenteritis and colitis, unspecified: Secondary | ICD-10-CM | POA: Diagnosis present

## 2023-04-30 DIAGNOSIS — R7401 Elevation of levels of liver transaminase levels: Secondary | ICD-10-CM | POA: Diagnosis present

## 2023-04-30 DIAGNOSIS — Z8249 Family history of ischemic heart disease and other diseases of the circulatory system: Secondary | ICD-10-CM | POA: Diagnosis not present

## 2023-04-30 DIAGNOSIS — R519 Headache, unspecified: Secondary | ICD-10-CM | POA: Diagnosis present

## 2023-04-30 LAB — HIV ANTIBODY (ROUTINE TESTING W REFLEX): HIV Screen 4th Generation wRfx: NONREACTIVE

## 2023-04-30 MED ORDER — TRAZODONE HCL 50 MG PO TABS: 50.0000 mg | ORAL_TABLET | Freq: Every evening | ORAL | Status: DC | PRN

## 2023-04-30 MED ORDER — SODIUM CHLORIDE 0.9 % IV SOLN
2.0000 g | Freq: Once | INTRAVENOUS | Status: AC
  Administered 2023-04-30: 2 g via INTRAVENOUS
  Filled 2023-04-30: qty 12.5

## 2023-04-30 MED ORDER — HYDRALAZINE HCL 20 MG/ML IJ SOLN: 10.0000 mg | INTRAMUSCULAR | Status: DC | PRN

## 2023-04-30 MED ORDER — OXYCODONE HCL 5 MG PO TABS
5.0000 mg | ORAL_TABLET | ORAL | Status: DC | PRN
  Administered 2023-05-01 – 2023-05-02 (×2): 5 mg via ORAL
  Filled 2023-04-30 (×2): qty 1

## 2023-04-30 MED ORDER — BUTALBITAL-APAP-CAFFEINE 50-325-40 MG PO TABS
1.0000 | ORAL_TABLET | Freq: Four times a day (QID) | ORAL | Status: DC | PRN
  Administered 2023-04-30 – 2023-05-01 (×3): 1 via ORAL
  Filled 2023-04-30 (×3): qty 1

## 2023-04-30 MED ORDER — AMLODIPINE BESYLATE 5 MG PO TABS: 10.0000 mg | ORAL_TABLET | Freq: Every day | ORAL | Status: DC

## 2023-04-30 MED ORDER — POTASSIUM CHLORIDE CRYS ER 20 MEQ PO TBCR
40.0000 meq | EXTENDED_RELEASE_TABLET | Freq: Once | ORAL | Status: AC
Start: 2023-04-30 — End: 2023-04-30
  Administered 2023-04-30: 40 meq via ORAL
  Filled 2023-04-30: qty 2

## 2023-04-30 MED ORDER — VANCOMYCIN HCL 2000 MG/400ML IV SOLN
2000.0000 mg | Freq: Once | INTRAVENOUS | Status: AC
  Administered 2023-04-30: 2000 mg via INTRAVENOUS
  Filled 2023-04-30: qty 400

## 2023-04-30 MED ORDER — SODIUM CHLORIDE 0.9% FLUSH: 3.0000 mL | INTRAVENOUS | Status: DC | PRN

## 2023-04-30 MED ORDER — SODIUM CHLORIDE 0.9 % IV SOLN: INTRAVENOUS | Status: DC

## 2023-04-30 MED ORDER — FENTANYL CITRATE PF 50 MCG/ML IJ SOSY
50.0000 ug | PREFILLED_SYRINGE | Freq: Once | INTRAMUSCULAR | Status: AC
  Administered 2023-04-30: 50 ug via INTRAVENOUS
  Filled 2023-04-30: qty 1

## 2023-04-30 MED ORDER — SODIUM CHLORIDE 0.9% FLUSH
3.0000 mL | Freq: Two times a day (BID) | INTRAVENOUS | Status: DC
  Administered 2023-04-30 – 2023-05-02 (×4): 3 mL via INTRAVENOUS

## 2023-04-30 MED ORDER — ALBUTEROL SULFATE (2.5 MG/3ML) 0.083% IN NEBU: 2.5000 mg | INHALATION_SOLUTION | RESPIRATORY_TRACT | Status: DC | PRN

## 2023-04-30 MED ORDER — VANCOMYCIN HCL IN DEXTROSE 1-5 GM/200ML-% IV SOLN: 1000.0000 mg | Freq: Once | INTRAVENOUS | Status: DC

## 2023-04-30 MED ORDER — ONDANSETRON HCL 4 MG/2ML IJ SOLN: 4.0000 mg | Freq: Four times a day (QID) | INTRAMUSCULAR | Status: DC | PRN

## 2023-04-30 MED ORDER — SODIUM CHLORIDE 0.9 % IV SOLN: 250.0000 mL | INTRAVENOUS | Status: DC | PRN

## 2023-04-30 MED ORDER — ACETAMINOPHEN 325 MG PO TABS
650.0000 mg | ORAL_TABLET | Freq: Four times a day (QID) | ORAL | Status: DC | PRN
  Administered 2023-04-30 – 2023-05-01 (×3): 650 mg via ORAL
  Filled 2023-04-30 (×3): qty 2

## 2023-04-30 MED ORDER — SODIUM CHLORIDE 0.9% FLUSH
3.0000 mL | Freq: Two times a day (BID) | INTRAVENOUS | Status: DC
  Administered 2023-04-30 – 2023-05-01 (×2): 3 mL via INTRAVENOUS

## 2023-04-30 MED ORDER — TRIAMTERENE-HCTZ 37.5-25 MG PO TABS
1.0000 | ORAL_TABLET | Freq: Every day | ORAL | Status: DC
Start: 2023-04-30 — End: 2023-04-30

## 2023-04-30 MED ORDER — IPRATROPIUM-ALBUTEROL 0.5-2.5 (3) MG/3ML IN SOLN: 3.0000 mL | RESPIRATORY_TRACT | Status: DC | PRN

## 2023-04-30 MED ORDER — ACETAMINOPHEN 650 MG RE SUPP: 650.0000 mg | Freq: Four times a day (QID) | RECTAL | Status: DC | PRN

## 2023-04-30 MED ORDER — POTASSIUM CHLORIDE CRYS ER 20 MEQ PO TBCR
40.0000 meq | EXTENDED_RELEASE_TABLET | Freq: Two times a day (BID) | ORAL | Status: AC
  Administered 2023-04-30 (×2): 40 meq via ORAL
  Filled 2023-04-30 (×2): qty 2

## 2023-04-30 MED ORDER — ENOXAPARIN SODIUM 80 MG/0.8ML IJ SOSY
70.0000 mg | PREFILLED_SYRINGE | INTRAMUSCULAR | Status: DC
  Administered 2023-04-30 – 2023-05-02 (×3): 70 mg via SUBCUTANEOUS
  Filled 2023-04-30 (×3): qty 0.7

## 2023-04-30 MED ORDER — ONDANSETRON HCL 4 MG PO TABS: 4.0000 mg | ORAL_TABLET | Freq: Four times a day (QID) | ORAL | Status: DC | PRN

## 2023-04-30 MED ORDER — CITALOPRAM HYDROBROMIDE 20 MG PO TABS
10.0000 mg | ORAL_TABLET | Freq: Every day | ORAL | Status: DC
  Administered 2023-04-30 – 2023-05-02 (×3): 10 mg via ORAL
  Filled 2023-04-30 (×3): qty 1

## 2023-04-30 MED ORDER — SODIUM CHLORIDE 0.9 % IV SOLN: 2.0000 g | Freq: Three times a day (TID) | INTRAVENOUS | Status: DC

## 2023-04-30 MED ORDER — ALLOPURINOL 100 MG PO TABS
100.0000 mg | ORAL_TABLET | Freq: Every day | ORAL | Status: DC
  Administered 2023-04-30 – 2023-05-02 (×3): 100 mg via ORAL
  Filled 2023-04-30 (×3): qty 1

## 2023-04-30 MED ORDER — METOPROLOL TARTRATE 5 MG/5ML IV SOLN: 5.0000 mg | INTRAVENOUS | Status: DC | PRN

## 2023-04-30 MED ORDER — BUTALBITAL-APAP-CAFFEINE 50-325-40 MG PO TABS
1.0000 | ORAL_TABLET | Freq: Once | ORAL | Status: AC
  Administered 2023-04-30: 1 via ORAL
  Filled 2023-04-30: qty 1

## 2023-04-30 MED ORDER — VANCOMYCIN HCL 1250 MG/250ML IV SOLN
1250.0000 mg | Freq: Two times a day (BID) | INTRAVENOUS | Status: DC
  Administered 2023-04-30 – 2023-05-02 (×4): 1250 mg via INTRAVENOUS
  Filled 2023-04-30 (×4): qty 250

## 2023-04-30 MED ORDER — POTASSIUM CHLORIDE CRYS ER 20 MEQ PO TBCR
40.0000 meq | EXTENDED_RELEASE_TABLET | Freq: Once | ORAL | Status: AC
  Administered 2023-04-30: 40 meq via ORAL
  Filled 2023-04-30: qty 2

## 2023-04-30 NOTE — Plan of Care (Deleted)
 History and Physical    Danny Gomez UJW:[id number] DOB: 05/14/1985 DOA: 04/29/2023  PCP: Kerri Perches, MD   Patient coming from: Home   Chief Complaint:  Chief Complaint  Patient presents with   Fever   Cellulitis   ED TRIAGE note:  Pt reports fever for a few days. Also waiting on blood culture results. Seen recently for cellulitis and on abx. Last took tylenol 3 hours ago.       HPI:  38 year old man history of asthma, essential hypertension, gout, generalized anxiety disorder, peripheral neuropathy, and history of unprovoked DVT 7/2020presented emergency department complaining about fever.  Patient also has left lower extremity swelling.  Seen in the ED on 4/9 for left lower extremity cellulitis and sent to ED from home with oral Bactrim.  Given the patient has been taking Bactrim left-sided lower extremity swelling, tenderness and pain has been progressively getting worse.  Patient reported fever and chill at home.  Denies any injury, insect bite or wound of the left lower extremity.  Complaining about generalized headache.     Hemodynamically stable. CBC showing leukocytosis 17.7. CMP showed low sodium 131, low potassium 3.1, low chloride 91, transaminitis AST 143/ALT 53. Normal lactic acid level. Blood tests are in process from 4/9.  DVT ultrasound 4/9 no evidence of lower extremity DVT.  For the worsening cellulitis in the ED patient has been given IV vancomycin and cefepime.  Hospitalist has been consulted for further evaluation management of left lower extremity cellulitis, hypokalemia and hyponatremia.  Significant labs in the ED: Lab Orders         CBC with Differential         Comprehensive metabolic panel         Comprehensive metabolic panel         CBC         HIV Antibody (routine testing w rflx)       Review of Systems:  Review of Systems  Constitutional:  Positive for fever. Negative for chills, malaise/fatigue and weight loss.   Respiratory:  Negative for cough and shortness of breath.   Cardiovascular:  Negative for chest pain and palpitations.  Gastrointestinal:  Negative for abdominal pain, heartburn, nausea and vomiting.  Musculoskeletal:  Negative for back pain, falls, joint pain, myalgias and neck pain.  Neurological:  Negative for dizziness.    Past Medical History:  Diagnosis Date   Asthma    pt. reports as a child he used an inhaler , but asthma has resoled in the adult path   Febrile illness    Headache(784.0)    Hypertension    Neuromuscular disorder (HCC)    hnp- L4-5, L5-S1   Obesity     Past Surgical History:  Procedure Laterality Date   right ankle surgery  2002   bones had fused but it was corrected surgically   TONSILLECTOMY       reports that he has never smoked. He has never used smokeless tobacco. He reports current drug use. Drug: Marijuana. He reports that he does not drink alcohol.  Allergies  Allergen Reactions   Lisinopril Cough    Family History  Problem Relation Age of Onset   Hypertension Mother    Obesity Mother     Prior to Admission medications   Medication Sig Start Date End Date Taking? Authorizing Provider  acetaminophen (TYLENOL) 650 MG CR tablet Take 1,300 mg by mouth every 8 (eight) hours as needed for pain.   Yes [provider]  allopurinol (ZYLOPRIM) 100 MG tablet Take 1 tablet by mouth once daily 03/22/23  Yes Kerri Perches, MD  amLODipine (NORVASC) 10 MG tablet Take 1 tablet (10 mg total) by mouth daily. 06/29/22  Yes Kerri Perches, MD  citalopram (CELEXA) 10 MG tablet Take 1 tablet (10 mg total) by mouth daily. 03/25/21  Yes Kerri Perches, MD  fluticasone (FLONASE) 50 MCG/ACT nasal spray Place 2 sprays into both nostrils daily. 04/01/23  Yes Hedges, Tinnie Gens, PA-C  ondansetron (ZOFRAN-ODT) 4 MG disintegrating tablet Take 1 tablet (4 mg total) by mouth every 8 (eight) hours as needed for nausea or vomiting. 04/27/23  Yes Arabella Merles, PA-C  oxyCODONE (ROXICODONE) 5 MG immediate release tablet Take 1 tablet (5 mg total) by mouth every 4 (four) hours as needed for severe pain (pain score 7-10). 04/28/23  Yes Karie Mainland, Amjad, PA-C  sulfamethoxazole-trimethoprim (BACTRIM DS) 800-160 MG tablet Take 1 tablet by mouth 2 (two) times daily. 04/28/23  Yes Anabel Halon, MD  triamterene-hydrochlorothiazide [facility]) 37.5-25 MG tablet Take 1 tablet by mouth once daily 01/27/23  Yes Kerri Perches, MD  potassium chloride SA (KLOR-CON M) 20 MEQ tablet Take 2 tablets (40 mEq total) by mouth once for 1 dose. Patient not taking: Reported on 04/30/2023 04/28/23 04/28/23  Marita Kansas, PA-C     Physical Exam: Vitals:   04/30/23 0335 04/30/23 0400 04/30/23 0430 04/30/23 0530  BP: 132/65 130/67 125/76 119/76  Pulse: 88 85 84 89  Resp: 18  16   Temp:  98.3 F (36.8 C)    TempSrc:      SpO2: 92% 98% 98% 94%  Weight:      Height:        Physical Exam Vitals and nursing note reviewed.  Constitutional:      Appearance: He is obese.  HENT:     Mouth/Throat:     Mouth: Mucous membranes are moist.  Cardiovascular:     Rate and Rhythm: Normal rate and regular rhythm.     Pulses: Normal pulses.  Pulmonary:     Effort: Pulmonary effort is normal.     Breath sounds: Normal breath sounds.  Musculoskeletal:        General: Tenderness present.     Cervical back: Neck supple.     Right lower leg: Edema present.     Left lower leg: Edema present.     Comments: Left lower extremity erythema, redness, and warmth to touch.  Skin:    General: Skin is warm.     Capillary Refill: Capillary refill takes less than 2 seconds.  Neurological:     Mental Status: He is oriented to person, place, and time.  Psychiatric:        Mood and Affect: Mood normal.      Labs on Admission: I have personally reviewed following labs and imaging studies  CBC:   Media Information   Document Information  Photos    04/30/2023 05:34  Attached To:   Hospital Encounter on 04/29/23  Source Information  Tereasa Coop, MD  [facility]  Document History    Basic Metabolic Panel: Recent Labs  Lab 04/28/23 2056 04/29/23 1926  NA 133* 131*  K 3.1* 3.1*  CL 97* 94*  CO2 26 26  GLUCOSE 120* 106*  BUN 10 7  CREATININE 1.04 1.16  CALCIUM 8.9 8.7*   GFR: Estimated Creatinine Clearance: 127.3 mL/min (by C-G formula based on SCr of 1.16 mg/dL). Liver Function Tests: Recent  Labs  Lab 04/28/23 2056 04/29/23 1926  AST 76* 143*  ALT 40 53*  ALKPHOS 61 68  BILITOT 0.7 0.7  PROT 7.3 7.3  ALBUMIN 3.3* 3.1*   Recent Labs  Lab 04/28/23 2056  LIPASE 22   No results for input(s): "AMMONIA" in the last 168 hours. Coagulation Profile: No results for input(s): "INR", "PROTIME" in the last 168 hours. Cardiac Enzymes: No results for input(s): "CKTOTAL", "CKMB", "CKMBINDEX", "TROPONINI", "TROPONINIHS" in the last 168 hours. BNP (last 3 results) No results for input(s): "BNP" in the last 8760 hours. HbA1C: No results for input(s): "HGBA1C" in the last 72 hours. CBG: No results for input(s): "GLUCAP" in the last 168 hours. Lipid Profile: No results for input(s): "CHOL", "HDL", "LDLCALC", "TRIG", "CHOLHDL", "LDLDIRECT" in the last 72 hours. Thyroid Function Tests: No results for input(s): "TSH", "T4TOTAL", "FREET4", "T3FREE", "THYROIDAB" in the last 72 hours. Anemia Panel: No results for input(s): "VITAMINB12", "FOLATE", "FERRITIN", "TIBC", "IRON", "RETICCTPCT" in the last 72 hours. Urine analysis:    Component Value Date/Time   COLORURINE YELLOW 04/28/2023 2039   APPEARANCEUR CLEAR 04/28/2023 2039   LABSPEC 1.025 04/28/2023 2039   PHURINE 6.0 04/28/2023 2039   GLUCOSEU NEGATIVE 04/28/2023 2039   HGBUR LARGE (A) 04/28/2023 2039   BILIRUBINUR NEGATIVE 04/28/2023 2039   BILIRUBINUR negative 05/11/2013 1657   KETONESUR NEGATIVE 04/28/2023 2039   PROTEINUR 100 (A) 04/28/2023 2039   UROBILINOGEN 0.2 05/11/2013 1657    NITRITE NEGATIVE 04/28/2023 2039   LEUKOCYTESUR NEGATIVE 04/28/2023 2039    Radiological Exams on Admission: I have personally reviewed images US Venous Img Lower Unilateral Left Result Date: 04/28/2023 CLINICAL DATA:  Left lower extremity swelling edema EXAM: LEFT LOWER EXTREMITY VENOUS DOPPLER ULTRASOUND TECHNIQUE: Gray-scale sonography with compression, as well as color and duplex ultrasound, were performed to evaluate the deep venous system(s) from the level of the common femoral vein through the popliteal and proximal calf veins. COMPARISON:  None available FINDINGS: VENOUS Normal compressibility of the common femoral, superficial femoral, and popliteal veins, as well as the visualized calf veins. Visualized portions of profunda femoral vein and great saphenous vein unremarkable. No filling defects to suggest DVT on grayscale or color Doppler imaging. Doppler waveforms show normal direction of venous flow, normal respiratory plasticity and response to augmentation. Limited views of the contralateral common femoral vein are unremarkable. OTHER Mildly enlarged lymph nodes seen in the left upper medial thigh measuring 1.3 cm in short axis Limitations: none IMPRESSION: 1. No DVT of the left lower extremity. 2. Nonspecific mildly enlarged left medial thigh lymph node, most likely reactive. Recommend follow-up ultrasound in 10-12 weeks to document resolution. Electronically Signed   By: Acquanetta Belling M.D.   On: 04/28/2023 12:29    Assessment/Plan: Principal Problem:   Cellulitis of left lower extremity Active Problems:   Hypokalemia   Hyponatremia   Essential hypertension   Generalized anxiety disorder   History of DVT (deep vein thrombosis)   Asthma, chronic   Gout   Neuropathy    Assessment and Plan: Cellulitis of the left lower extremity -Patient presenting with worsening swelling and pain of the left lower extremity.  Was seen in the ED on 4/9 and he was prescribed Bactrim however coming  today for worsening pain and swelling.[name] Kitchen  Ultrasound ruled out DVT.  Blood cultures are pending. - Hemodynamically stable.  CBC leukocytosis 17.7.  Normal lactic acid level 1.4. - In the ED patient has been given IV vancomycin and cefepime. - Continue IV vancomycin and  cefepime with pharmacy consult.  Will follow-up with blood culture result for appropriate antibiotic guidance.  Hypokalemia -History of chronic hypokalemia.  Potassium 3.1.  Replating with oral KCl.  Chronic hyponatremia - Serum sodium 131.  Holding hydrochlorothiazide in the setting of hyponatremia.     Essential hypertension -Patient blood pressure is borderline soft.  Holding amlodipine and hydrochlorothiazide.   Generalized anxiety disorder -Continue Celexa.  History of unprovoked DVT in 2020 -Patient is currently not on any anticoagulation.  Asthma Stable.  Continue albuterol as needed.  Gout Continue allopurinol.  Peripheral neuropathy -Holding Roxicodone.  If indicated  will start oral gabapentin low-dose.  Chronic headache - Continue Fioricet as needed  DVT prophylaxis:  Lovenox Code Status:  Full Code Diet: Heart healthy diet Family Communication:   Family was present at bedside, at the time of interview. Opportunity was given to ask question and all questions were answered satisfactorily.  Disposition Plan: Continue to monitor improvement of left-sided lower extremity cellulitis and pending blood cultures. Consults: None at this time Admission status:   Inpatient, Telemetry bed  Severity of Illness: The appropriate patient status for this patient is INPATIENT. Inpatient status is judged to be reasonable and necessary in order to provide the required intensity of service to ensure the patient's safety. The patient's presenting symptoms, physical exam findings, and initial radiographic and laboratory data in the context of their chronic comorbidities is felt to place them at high risk for further  clinical deterioration. Furthermore, it is not anticipated that the patient will be medically stable for discharge from the hospital within 2 midnights of admission.   * I certify that at the point of admission it is my clinical judgment that the patient will require inpatient hospital care spanning beyond 2 midnights from the point of admission due to high intensity of service, high risk for further deterioration and high frequency of surveillance required.[name] Kitchen    Tereasa Coop, MD [facility]  How to contact the [facility] Attending or Consulting provider 7A - 7P or covering provider during after hours 7P -7A, for this patient.  Check the care team in [place] and look for a) attending/consulting [facility] provider listed and b) the [facility] team listed Log into [email or web address] and use [facility]'s universal password to access. If you do not have the password, please contact the hospital operator. Locate the [facility] provider you are looking for under [facility] and page to a number that you can be directly reached. If you still have difficulty reaching the provider, please page the [facility] (Director on Call) for the Hospitalists listed on amion for assistance.  04/30/2023, 6:03 AM

## 2023-04-30 NOTE — Hospital Course (Addendum)
 Brief Narrative:  38 year old with history of asthma, HTN, GAD, gout, peripheral neuropathy, unprovoked DVT in 2020 comes to the ED with fever and left upper extremity swelling.  She is diagnosed with left lower extremity cellulitis on 4/9 and was placed on Bactrim but due to worsening return back to the ER.  Dopplers on 4/9 were negative for DVT.  Currently started on vancomycin and cefepime.  Now on IV vancomycin, improving.   Assessment & Plan:  Principal Problem:   Cellulitis of left lower extremity Active Problems:   Hypokalemia   Hyponatremia   Essential hypertension   Generalized anxiety disorder   History of DVT (deep vein thrombosis)   Asthma, chronic   Gout   Neuropathy    Cellulitis of the left lower extremity - Initially came to the ED on 4/9 and was prescribed Bactrim but due to worsening of symptoms return back to the hospital.  Discontinue cefepime, continue IV vancomycin.  Further tailor antibiotics as appropriate    Hypokalemia - As needed repletion.  Check magnesium and phosphorus   Chronic hyponatremia - Chronic.  Will give IV fluids     Essential hypertension - Due to soft pressures initially amlodipine and HCTZ were on hold - IV fluids     Generalized anxiety disorder -Continue Celexa.   History of unprovoked DVT in 2020 -Patient is currently not on any anticoagulation.   Asthma Stable.  Continue albuterol as needed.   Gout Continue allopurinol.   Peripheral neuropathy -Holding Roxicodone.  If indicated  will start oral gabapentin low-dose.   Chronic headache - Continue Fioricet as needed  DVT prophylaxis: SCDs Start: 04/30/23 0519 Place TED hose Start: 04/30/23 0519      Code Status: Full Code Family Communication:   Continue hospital stay for at least 24 hours of IV antibiotics If erythema, pain continues to regress over next 24 hours he can be transition to p.o.   Subjective: Feels better, erythema has regressed from previously  demarcated with surgical marker   Examination:  General exam: Appears calm and comfortable  Respiratory system: Clear to auscultation. Respiratory effort normal. Cardiovascular system: S1 & S2 heard, RRR. No JVD, murmurs, rubs, gallops or clicks. No pedal edema. Gastrointestinal system: Abdomen is nondistended, soft and nontender. No organomegaly or masses felt. Normal bowel sounds heard. Central nervous system: Alert and oriented. No focal neurological deficits. Extremities: Symmetric 5 x 5 power. Skin: Lower extremity erythema shows regression from previously marked site Psychiatry: Judgement and insight appear normal. Mood & affect appropriate.

## 2023-04-30 NOTE — H&P (Signed)
 History and Physical     Danny Gomez QVZ:[id number] DOB: December 08, 1985 DOA: 04/29/2023   PCP: Kerri Perches, MD    Patient coming from: Home     Chief Complaint:     Chief Complaint  Patient presents with   Fever   Cellulitis    ED TRIAGE note:  Pt reports fever for a few days. Also waiting on blood culture results. Seen recently for cellulitis and on abx. Last took tylenol 3 hours ago.         HPI:  38 year old man history of asthma, essential hypertension, gout, generalized anxiety disorder, peripheral neuropathy, and history of unprovoked DVT 7/2020presented emergency department complaining about fever.  Patient also has left lower extremity swelling.  Seen in the ED on 4/9 for left lower extremity cellulitis and sent to ED from home with oral Bactrim.   Given the patient has been taking Bactrim left-sided lower extremity swelling, tenderness and pain has been progressively getting worse.  Patient reported fever and chill at home.  Denies any injury, insect bite or wound of the left lower extremity.  Complaining about generalized headache.       Hemodynamically stable. CBC showing leukocytosis 17.7. CMP showed low sodium 131, low potassium 3.1, low chloride 91, transaminitis AST 143/ALT 53. Normal lactic acid level. Blood tests are in process from 4/9.   DVT ultrasound 4/9 no evidence of lower extremity DVT.   For the worsening cellulitis in the ED patient has been given IV vancomycin and cefepime.   Hospitalist has been consulted for further evaluation management of left lower extremity cellulitis, hypokalemia and hyponatremia.   Significant labs in the ED: Lab Orders         CBC with Differential         Comprehensive metabolic panel         Comprehensive metabolic panel         CBC         HIV Antibody (routine testing w rflx)          Review of Systems:  Review of Systems  Constitutional:  Positive for fever. Negative for chills, malaise/fatigue and  weight loss.  Respiratory:  Negative for cough and shortness of breath.   Cardiovascular:  Negative for chest pain and palpitations.  Gastrointestinal:  Negative for abdominal pain, heartburn, nausea and vomiting.  Musculoskeletal:  Negative for back pain, falls, joint pain, myalgias and neck pain.  Neurological:  Negative for dizziness.          Past Medical History:  Diagnosis Date   Asthma      pt. reports as a child he used an inhaler , but asthma has resoled in the adult path   Febrile illness     Headache(784.0)     Hypertension     Neuromuscular disorder (HCC)      hnp- L4-5, L5-S1   Obesity                 Past Surgical History:  Procedure Laterality Date   right ankle surgery   2002    bones had fused but it was corrected surgically   TONSILLECTOMY               reports that he has never smoked. He has never used smokeless tobacco. He reports current drug use. Drug: Marijuana. He reports that he does not drink alcohol.   Allergies      Allergies  Allergen Reactions   Lisinopril Cough  Family History  Problem Relation Age of Onset   Hypertension Mother     Obesity Mother                   Prior to Admission medications   Medication Sig Start Date End Date Taking? Authorizing Provider  acetaminophen (TYLENOL) 650 MG CR tablet Take 1,300 mg by mouth every 8 (eight) hours as needed for pain.     Yes [provider]  allopurinol (ZYLOPRIM) 100 MG tablet Take 1 tablet by mouth once daily 03/22/23   Yes Kerri Perches, MD  amLODipine (NORVASC) 10 MG tablet Take 1 tablet (10 mg total) by mouth daily. 06/29/22   Yes Kerri Perches, MD  citalopram (CELEXA) 10 MG tablet Take 1 tablet (10 mg total) by mouth daily. 03/25/21   Yes Kerri Perches, MD  fluticasone (FLONASE) 50 MCG/ACT nasal spray Place 2 sprays into both nostrils daily. 04/01/23   Yes Hedges, Tinnie Gens, PA-C  ondansetron (ZOFRAN-ODT) 4 MG disintegrating tablet Take 1  tablet (4 mg total) by mouth every 8 (eight) hours as needed for nausea or vomiting. 04/27/23   Yes Arabella Merles, PA-C  oxyCODONE (ROXICODONE) 5 MG immediate release tablet Take 1 tablet (5 mg total) by mouth every 4 (four) hours as needed for severe pain (pain score 7-10). 04/28/23   Yes Karie Mainland, Amjad, PA-C  sulfamethoxazole-trimethoprim (BACTRIM DS) 800-160 MG tablet Take 1 tablet by mouth 2 (two) times daily. 04/28/23   Yes Anabel Halon, MD  triamterene-hydrochlorothiazide [facility]) 37.5-25 MG tablet Take 1 tablet by mouth once daily 01/27/23   Yes Kerri Perches, MD  potassium chloride SA (KLOR-CON M) 20 MEQ tablet Take 2 tablets (40 mEq total) by mouth once for 1 dose. Patient not taking: Reported on 04/30/2023 04/28/23 04/28/23   Marita Kansas, PA-C        Physical Exam:       Vitals:    04/30/23 0335 04/30/23 0400 04/30/23 0430 04/30/23 0530  BP: 132/65 130/67 125/76 119/76  Pulse: 88 85 84 89  Resp: 18   16    Temp:   98.3 F (36.8 C)      TempSrc:          SpO2: 92% 98% 98% 94%  Weight:          Height:              Physical Exam Vitals and nursing note reviewed.  Constitutional:      Appearance: He is obese.  HENT:     Mouth/Throat:     Mouth: Mucous membranes are moist.  Cardiovascular:     Rate and Rhythm: Normal rate and regular rhythm.     Pulses: Normal pulses.  Pulmonary:     Effort: Pulmonary effort is normal.     Breath sounds: Normal breath sounds.  Musculoskeletal:        General: Tenderness present.     Cervical back: Neck supple.     Right lower leg: Edema present.     Left lower leg: Edema present.     Comments: Left lower extremity erythema, redness, and warmth to touch.  Skin:    General: Skin is warm.     Capillary Refill: Capillary refill takes less than 2 seconds.  Neurological:     Mental Status: He is oriented to person, place, and time.  Psychiatric:        Mood and Affect: Mood normal.  Labs on Admission: I have personally  reviewed following labs and imaging studies   CBC:   Media Information   Document Information   Photos     04/30/2023 05:34  Attached To:  Hospital Encounter on 04/29/23  Source Information   Tereasa Coop, MD  [facility]  Document History     Basic Metabolic Panel: Last Labs      Recent Labs  Lab 04/28/23 2056 04/29/23 1926  NA 133* 131*  K 3.1* 3.1*  CL 97* 94*  CO2 26 26  GLUCOSE 120* 106*  BUN 10 7  CREATININE 1.04 1.16  CALCIUM 8.9 8.7*      GFR: Estimated Creatinine Clearance: 127.3 mL/min (by C-G formula based on SCr of 1.16 mg/dL). Liver Function Tests: Last Labs      Recent Labs  Lab 04/28/23 2056 04/29/23 1926  AST 76* 143*  ALT 40 53*  ALKPHOS 61 68  BILITOT 0.7 0.7  PROT 7.3 7.3  ALBUMIN 3.3* 3.1*      Last Labs     Recent Labs  Lab 04/28/23 2056  LIPASE 22      Last Labs  No results for input(s): "AMMONIA" in the last 168 hours.   Coagulation Profile: Last Labs  No results for input(s): "INR", "PROTIME" in the last 168 hours.   Cardiac Enzymes: Last Labs  No results for input(s): "CKTOTAL", "CKMB", "CKMBINDEX", "TROPONINI", "TROPONINIHS" in the last 168 hours.   BNP (last 3 results) Recent Labs (within last 365 days)  No results for input(s): "BNP" in the last 8760 hours.   HbA1C: Recent Labs (last 2 labs)  No results for input(s): "HGBA1C" in the last 72 hours.   CBG: Last Labs  No results for input(s): "GLUCAP" in the last 168 hours.   Lipid Profile: Recent Labs (last 2 labs)  No results for input(s): "CHOL", "HDL", "LDLCALC", "TRIG", "CHOLHDL", "LDLDIRECT" in the last 72 hours.   Thyroid Function Tests: Recent Labs (last 2 labs)  No results for input(s): "TSH", "T4TOTAL", "FREET4", "T3FREE", "THYROIDAB" in the last 72 hours.   Anemia Panel: Recent Labs (last 2 labs)  No results for input(s): "VITAMINB12", "FOLATE", "FERRITIN", "TIBC", "IRON", "RETICCTPCT" in the last 72 hours.   Urine  analysis: Labs (Brief)          Component Value Date/Time    COLORURINE YELLOW 04/28/2023 2039    APPEARANCEUR CLEAR 04/28/2023 2039    LABSPEC 1.025 04/28/2023 2039    PHURINE 6.0 04/28/2023 2039    GLUCOSEU NEGATIVE 04/28/2023 2039    HGBUR LARGE (A) 04/28/2023 2039    BILIRUBINUR NEGATIVE 04/28/2023 2039    BILIRUBINUR negative 05/11/2013 1657    KETONESUR NEGATIVE 04/28/2023 2039    PROTEINUR 100 (A) 04/28/2023 2039    UROBILINOGEN 0.2 05/11/2013 1657    NITRITE NEGATIVE 04/28/2023 2039    LEUKOCYTESUR NEGATIVE 04/28/2023 2039        Radiological Exams on Admission: I have personally reviewed images  Imaging Results (Last 48 hours)  US Venous Img Lower Unilateral Left Result Date: 04/28/2023 CLINICAL DATA:  Left lower extremity swelling edema EXAM: LEFT LOWER EXTREMITY VENOUS DOPPLER ULTRASOUND TECHNIQUE: Gray-scale sonography with compression, as well as color and duplex ultrasound, were performed to evaluate the deep venous system(s) from the level of the common femoral vein through the popliteal and proximal calf veins. COMPARISON:  None available FINDINGS: VENOUS Normal compressibility of the common femoral, superficial femoral, and popliteal veins, as well as the visualized calf veins. Visualized  portions of profunda femoral vein and great saphenous vein unremarkable. No filling defects to suggest DVT on grayscale or color Doppler imaging. Doppler waveforms show normal direction of venous flow, normal respiratory plasticity and response to augmentation. Limited views of the contralateral common femoral vein are unremarkable. OTHER Mildly enlarged lymph nodes seen in the left upper medial thigh measuring 1.3 cm in short axis Limitations: none IMPRESSION: 1. No DVT of the left lower extremity. 2. Nonspecific mildly enlarged left medial thigh lymph node, most likely reactive. Recommend follow-up ultrasound in 10-12 weeks to document resolution. Electronically Signed   By: Acquanetta Belling  M.D.   On: 04/28/2023 12:29       Assessment/Plan: Principal Problem:   Cellulitis of left lower extremity Active Problems:   Hypokalemia   Hyponatremia   Essential hypertension   Generalized anxiety disorder   History of DVT (deep vein thrombosis)   Asthma, chronic   Gout   Neuropathy     Assessment and Plan: Cellulitis of the left lower extremity -Patient presenting with worsening swelling and pain of the left lower extremity.  Was seen in the ED on 4/9 and he was prescribed Bactrim however coming today for worsening pain and swelling.[name] Kitchen  Ultrasound ruled out DVT.  Blood cultures are pending. - Hemodynamically stable.  CBC leukocytosis 17.7.  Normal lactic acid level 1.4. - In the ED patient has been given IV vancomycin and cefepime. - Continue IV vancomycin and cefepime with pharmacy consult.  Will follow-up with blood culture result for appropriate antibiotic guidance.   Hypokalemia -History of chronic hypokalemia.  Potassium 3.1.  Replating with oral KCl.   Chronic hyponatremia - Serum sodium 131.  Holding hydrochlorothiazide in the setting of hyponatremia.       Essential hypertension -Patient blood pressure is borderline soft.  Holding amlodipine and hydrochlorothiazide.     Generalized anxiety disorder -Continue Celexa.   History of unprovoked DVT in 2020 -Patient is currently not on any anticoagulation.   Asthma Stable.  Continue albuterol as needed.   Gout Continue allopurinol.   Peripheral neuropathy -Holding Roxicodone.  If indicated  will start oral gabapentin low-dose.   Chronic headache - Continue Fioricet as needed   DVT prophylaxis:  Lovenox Code Status:  Full Code Diet: Heart healthy diet Family Communication:   Family was present at bedside, at the time of interview. Opportunity was given to ask question and all questions were answered satisfactorily.  Disposition Plan: Continue to monitor improvement of left-sided lower extremity cellulitis  and pending blood cultures. Consults: None at this time Admission status:   Inpatient, Telemetry bed   Severity of Illness: The appropriate patient status for this patient is INPATIENT. Inpatient status is judged to be reasonable and necessary in order to provide the required intensity of service to ensure the patient's safety. The patient's presenting symptoms, physical exam findings, and initial radiographic and laboratory data in the context of their chronic comorbidities is felt to place them at high risk for further clinical deterioration. Furthermore, it is not anticipated that the patient will be medically stable for discharge from the hospital within 2 midnights of admission.    * I certify that at the point of admission it is my clinical judgment that the patient will require inpatient hospital care spanning beyond 2 midnights from the point of admission due to high intensity of service, high risk for further deterioration and high frequency of surveillance required.[name] Kitchen       Tereasa Coop, MD [facility]  How to contact the [facility] Attending or Consulting provider 7A - 7P or covering provider during after hours 7P -7A, for this patient.   Check the care team in [facility] and look for a) attending/consulting [facility] provider listed and b) the [facility] team listed Log into [email or web address] and use [place]'s universal password to access. If you do not have the password, please contact the hospital operator. Locate the [facility] provider you are looking for under [facility] and page to a number that you can be directly reached. If you still have difficulty reaching the provider, please page the [facility] (Director on Call) for the Hospitalists listed on amion for assistance.   04/30/2023, 6:03 AM

## 2023-04-30 NOTE — Progress Notes (Signed)
 PROGRESS NOTE    Danny Gomez  WUJ:[id number] DOB: 1985-12-01 DOA: 04/29/2023 PCP: Kerri Perches, MD    Brief Narrative:  38 year old with history of asthma, HTN, GAD, gout, peripheral neuropathy, unprovoked DVT in 2020 comes to the ED with fever and left upper extremity swelling.  She is diagnosed with left lower extremity cellulitis on 4/9 and was placed on Bactrim but due to worsening return back to the ER.  Dopplers on 4/9 were negative for DVT.  Currently started on vancomycin and cefepime.  Now on IV vancomycin, improving.   Assessment & Plan:  Principal Problem:   Cellulitis of left lower extremity Active Problems:   Hypokalemia   Hyponatremia   Essential hypertension   Generalized anxiety disorder   History of DVT (deep vein thrombosis)   Asthma, chronic   Gout   Neuropathy    Cellulitis of the left lower extremity - Initially came to the ED on 4/9 and was prescribed Bactrim but due to worsening of symptoms return back to the hospital.  Discontinue cefepime, continue IV vancomycin.  Further tailor antibiotics as appropriate    Hypokalemia - As needed repletion.  Check magnesium and phosphorus   Chronic hyponatremia - Chronic.  Will give IV fluids     Essential hypertension - Due to soft pressures initially amlodipine and HCTZ were on hold - IV fluids     Generalized anxiety disorder -Continue Celexa.   History of unprovoked DVT in 2020 -Patient is currently not on any anticoagulation.   Asthma Stable.  Continue albuterol as needed.   Gout Continue allopurinol.   Peripheral neuropathy -Holding Roxicodone.  If indicated  will start oral gabapentin low-dose.   Chronic headache - Continue Fioricet as needed  DVT prophylaxis: SCDs Start: 04/30/23 0519 Place TED hose Start: 04/30/23 0519      Code Status: Full Code Family Communication:   Continue hospital stay for at least 24 hours of IV antibiotics If erythema, pain continues to regress  over next 24 hours he can be transition to p.o.   Subjective: Feels better, erythema has regressed from previously demarcated with surgical marker   Examination:  General exam: Appears calm and comfortable  Respiratory system: Clear to auscultation. Respiratory effort normal. Cardiovascular system: S1 & S2 heard, RRR. No JVD, murmurs, rubs, gallops or clicks. No pedal edema. Gastrointestinal system: Abdomen is nondistended, soft and nontender. No organomegaly or masses felt. Normal bowel sounds heard. Central nervous system: Alert and oriented. No focal neurological deficits. Extremities: Symmetric 5 x 5 power. Skin: Lower extremity erythema shows regression from previously marked site Psychiatry: Judgement and insight appear normal. Mood & affect appropriate.                Diet Orders (From admission, onward)     Start     Ordered   04/30/23 0519  Diet Heart Room service appropriate? Yes; Fluid consistency: Thin  Diet effective now       Question Answer Comment  Room service appropriate? Yes   Fluid consistency: Thin      04/30/23 0518            Objective: Vitals:   04/30/23 0606 04/30/23 0615 04/30/23 0658 04/30/23 0855  BP: (!) 112/99 125/82 137/60 (!) 135/59  Pulse: 96 83 90 93  Resp:  18 20 18   Temp:  98.1 F (36.7 C) 98.2 F (36.8 C) 99.2 F (37.3 C)  TempSrc:  Oral    SpO2: 100% 100% 97% 96%  Weight:  Height:        Intake/Output Summary (Last 24 hours) at 04/30/2023 1211 Last data filed at 04/30/2023 0703 Gross per 24 hour  Intake 240 ml  Output --  Net 240 ml   Filed Weights   04/29/23 1828  Weight: (!) 145 kg    Scheduled Meds:  allopurinol  100 mg Oral Daily   citalopram  10 mg Oral Daily   enoxaparin (LOVENOX) injection  70 mg Subcutaneous Q24H   potassium chloride  40 mEq Oral Once   potassium chloride  40 mEq Oral BID   sodium chloride flush  3 mL Intravenous Q12H   sodium chloride flush  3 mL Intravenous Q12H    Continuous Infusions:  sodium chloride     sodium chloride 75 mL/hr at 04/30/23 1154   vancomycin      Nutritional status     Body mass index is 44.58 kg/m.  Data Reviewed:   CBC: Recent Labs  Lab 04/28/23 2056 04/29/23 1926  WBC 20.7* 17.7*  NEUTROABS 19.0* 14.8*  HGB 15.1 15.2  HCT 43.9 45.5  MCV 88.0 88.9  PLT 233 233   Basic Metabolic Panel: Recent Labs  Lab 04/28/23 2056 04/29/23 1926  NA 133* 131*  K 3.1* 3.1*  CL 97* 94*  CO2 26 26  GLUCOSE 120* 106*  BUN 10 7  CREATININE 1.04 1.16  CALCIUM 8.9 8.7*   GFR: Estimated Creatinine Clearance: 127.3 mL/min (by C-G formula based on SCr of 1.16 mg/dL). Liver Function Tests: Recent Labs  Lab 04/28/23 2056 04/29/23 1926  AST 76* 143*  ALT 40 53*  ALKPHOS 61 68  BILITOT 0.7 0.7  PROT 7.3 7.3  ALBUMIN 3.3* 3.1*   Recent Labs  Lab 04/28/23 2056  LIPASE 22   No results for input(s): "AMMONIA" in the last 168 hours. Coagulation Profile: No results for input(s): "INR", "PROTIME" in the last 168 hours. Cardiac Enzymes: No results for input(s): "CKTOTAL", "CKMB", "CKMBINDEX", "TROPONINI" in the last 168 hours. BNP (last 3 results) No results for input(s): "PROBNP" in the last 8760 hours. HbA1C: No results for input(s): "HGBA1C" in the last 72 hours. CBG: No results for input(s): "GLUCAP" in the last 168 hours. Lipid Profile: No results for input(s): "CHOL", "HDL", "LDLCALC", "TRIG", "CHOLHDL", "LDLDIRECT" in the last 72 hours. Thyroid Function Tests: No results for input(s): "TSH", "T4TOTAL", "FREET4", "T3FREE", "THYROIDAB" in the last 72 hours. Anemia Panel: No results for input(s): "VITAMINB12", "FOLATE", "FERRITIN", "TIBC", "IRON", "RETICCTPCT" in the last 72 hours. Sepsis Labs: Recent Labs  Lab 04/28/23 2056 04/29/23 1932  LATICACIDVEN 1.7 1.4    Recent Results (from the past 240 hours)  Resp panel by RT-PCR (RSV, Flu A&B, Covid) Anterior Nasal Swab     Status: None   Collection Time:  04/27/23 11:29 AM   Specimen: Anterior Nasal Swab  Result Value Ref Range Status   SARS Coronavirus 2 by RT PCR NEGATIVE NEGATIVE Final    Comment: (NOTE) SARS-CoV-2 target nucleic acids are NOT DETECTED.  The SARS-CoV-2 RNA is generally detectable in upper respiratory specimens during the acute phase of infection. The lowest concentration of SARS-CoV-2 viral copies this assay can detect is 138 copies/mL. A negative result does not preclude SARS-Cov-2 infection and should not be used as the sole basis for treatment or other patient management decisions. A negative result may occur with  improper specimen collection/handling, submission of specimen other than nasopharyngeal swab, presence of viral mutation(s) within the areas targeted by this assay, and inadequate  number of viral copies(<138 copies/mL). A negative result must be combined with clinical observations, patient history, and epidemiological information. The expected result is Negative.  Fact Sheet for Patients:  BloggerCourse.com  Fact Sheet for Healthcare Providers:  SeriousBroker.it  This test is no t yet approved or cleared by the [place] FDA and  has been authorized for detection and/or diagnosis of SARS-CoV-2 by FDA under an Emergency Use Authorization (EUA). This EUA will remain  in effect (meaning this test can be used) for the duration of the COVID-19 declaration under Section 564(b)(1) of the Act, 21 U.S.C.section 360bbb-3(b)(1), unless the authorization is terminated  or revoked sooner.       Influenza A by PCR NEGATIVE NEGATIVE Final   Influenza B by PCR NEGATIVE NEGATIVE Final    Comment: (NOTE) The Xpert Xpress SARS-CoV-2/FLU/RSV plus assay is intended as an aid in the diagnosis of influenza from Nasopharyngeal swab specimens and should not be used as a sole basis for treatment. Nasal washings and aspirates are unacceptable for Xpert Xpress  SARS-CoV-2/FLU/RSV testing.  Fact Sheet for Patients: BloggerCourse.com  Fact Sheet for Healthcare Providers: SeriousBroker.it  This test is not yet approved or cleared by the [place] FDA and has been authorized for detection and/or diagnosis of SARS-CoV-2 by FDA under an Emergency Use Authorization (EUA). This EUA will remain in effect (meaning this test can be used) for the duration of the COVID-19 declaration under Section 564(b)(1) of the Act, 21 U.S.C. section 360bbb-3(b)(1), unless the authorization is terminated or revoked.     Resp Syncytial Virus by PCR NEGATIVE NEGATIVE Final    Comment: (NOTE) Fact Sheet for Patients: BloggerCourse.com  Fact Sheet for Healthcare Providers: SeriousBroker.it  This test is not yet approved or cleared by the [place] FDA and has been authorized for detection and/or diagnosis of SARS-CoV-2 by FDA under an Emergency Use Authorization (EUA). This EUA will remain in effect (meaning this test can be used) for the duration of the COVID-19 declaration under Section 564(b)(1) of the Act, 21 U.S.C. section 360bbb-3(b)(1), unless the authorization is terminated or revoked.  Performed at [facility], [address] [place], [place] [address]   Blood culture (routine x 2)     Status: None (Preliminary result)   Collection Time: 04/28/23  8:56 PM   Specimen: BLOOD LEFT FOREARM  Result Value Ref Range Status   Specimen Description   Final    BLOOD LEFT FOREARM Performed at [facility], [address], [place], [place] [address]    Special Requests   Final    BOTTLES DRAWN AEROBIC AND ANAEROBIC Blood Culture adequate volume Performed at [facility], [address] [place], [place] [address]    Culture   Final    NO GROWTH < 24 HOURS Performed at [facility], [address] [place], [place]  [address]    Report Status PENDING  Incomplete  Blood culture (routine x 2)     Status: None (Preliminary result)   Collection Time: 04/28/23  8:56 PM   Specimen: BLOOD  Result Value Ref Range Status   Specimen Description   Final    BLOOD LEFT ANTECUBITAL Performed at [facility], [address] [place], [place] [address]    Special Requests   Final    BOTTLES DRAWN AEROBIC AND ANAEROBIC Blood Culture adequate volume Performed at [facility], [address], [place], [place] [address]  Culture   Final    NO GROWTH < 24 HOURS Performed at [facility], [address], [place], [place] [address]    Report Status PENDING  Incomplete         Radiology Studies: No results found.         LOS: 0 days   Time spent= 35 mins    Miguel Rota, MD [facility]  If 7PM-7AM, please contact night-coverage  04/30/2023, 12:11 PM

## 2023-04-30 NOTE — Progress Notes (Signed)
 Pharmacy Antibiotic Note  Danny Gomez is a 38 y.o. male admitted on 04/29/2023 with cellulitis.  Pharmacy has been consulted for vancomycin and cefepime dosing. Recently sent home from ED with oral course of Bactrim for cellulitis on 4/9 now with reports of fever.  -Vancomycin 2g IV x1, cefepime 2g IV x1 -4/9 Bcx:ng -WBC 20 > 17, sCr 1.16 (bl ~1), afebrile within hospital  Plan: -Cefepime 2g IV every 8 hours  -Vancomycin 2g IV x1 -Vancomycin 1250mg  IV every 12 hours (AUC 423, Vd 0.5, IBW, sCr 1.16) -Monitor renal function -Follow up signs of clinical improvement, LOT, de-escalation of antibiotics   Height: 5\' 11"  (180.3 cm) Weight: (!) 145 kg (319 lb 10.7 oz) IBW/kg (Calculated) : 75.3  Temp (24hrs), Avg:98.6 F (37 C), Min:98.3 F (36.8 C), Max:99 F (37.2 C)  Recent Labs  Lab 04/28/23 2056 04/29/23 1926 04/29/23 1932  WBC 20.7* 17.7*  --   CREATININE 1.04 1.16  --   LATICACIDVEN 1.7  --  1.4    Estimated Creatinine Clearance: 127.3 mL/min (by C-G formula based on SCr of 1.16 mg/dL).    Allergies  Allergen Reactions   Lisinopril Cough    Antimicrobials this admission: Cefepime 4/10 >> Vancomycin 4/10 >>   Microbiology results: 4/9 BCx:   Thank you for allowing pharmacy to be a part of this patient's care.  Arabella Merles, PharmD. Clinical Pharmacist 04/30/2023 5:37 AM

## 2023-04-30 NOTE — ED Provider Notes (Signed)
 [place] EMERGENCY DEPARTMENT AT [facility] Provider Note   CSN: [id number] Arrival date & time: 04/29/23  1725     History  Chief Complaint  Patient presents with   Fever   Cellulitis    Danny Gomez is a 38 y.o. male who presents with concern for worsening redness, swelling, pain in the left lower leg where he has cellulitis which has been treated with Bactrim in the outpatient setting.  Seen in the ED for the same yesterday.  Presents today with the above and a new fever with Tmax of 102 F at home.  No nausea vomiting diarrhea.  Headache.  No pain in the penis or testicles per patient.  No history of skin infection in the past.  History of hypertension, history of DVT but DVT study yesterday was negative.  Generalized anxiety disorder.  No anticoagulation.  No history of IVDU or immunocompromising condition.  HPI     Home Medications Prior to Admission medications   Medication Sig Start Date End Date Taking? Authorizing Provider  acetaminophen (TYLENOL) 650 MG CR tablet Take 1,300 mg by mouth every 8 (eight) hours as needed for pain.   Yes [provider]  allopurinol (ZYLOPRIM) 100 MG tablet Take 1 tablet by mouth once daily 03/22/23  Yes Kerri Perches, MD  amLODipine (NORVASC) 10 MG tablet Take 1 tablet (10 mg total) by mouth daily. 06/29/22  Yes Kerri Perches, MD  citalopram (CELEXA) 10 MG tablet Take 1 tablet (10 mg total) by mouth daily. 03/25/21  Yes Kerri Perches, MD  fluticasone (FLONASE) 50 MCG/ACT nasal spray Place 2 sprays into both nostrils daily. 04/01/23  Yes Hedges, Tinnie Gens, PA-C  ondansetron (ZOFRAN-ODT) 4 MG disintegrating tablet Take 1 tablet (4 mg total) by mouth every 8 (eight) hours as needed for nausea or vomiting. 04/27/23  Yes Arabella Merles, PA-C  oxyCODONE (ROXICODONE) 5 MG immediate release tablet Take 1 tablet (5 mg total) by mouth every 4 (four) hours as needed for severe pain (pain score 7-10). 04/28/23  Yes Karie Mainland,  Amjad, PA-C  sulfamethoxazole-trimethoprim (BACTRIM DS) 800-160 MG tablet Take 1 tablet by mouth 2 (two) times daily. 04/28/23  Yes Anabel Halon, MD  triamterene-hydrochlorothiazide [facility]) 37.5-25 MG tablet Take 1 tablet by mouth once daily 01/27/23  Yes Kerri Perches, MD  potassium chloride SA (KLOR-CON M) 20 MEQ tablet Take 2 tablets (40 mEq total) by mouth once for 1 dose. Patient not taking: Reported on 04/30/2023 04/28/23 04/28/23  Marita Kansas, PA-C      Allergies    Lisinopril    Review of Systems   Review of Systems  Skin:        Redness swelling    Physical Exam Updated Vital Signs BP 119/76   Pulse 89   Temp 98.3 F (36.8 C)   Resp 16   Ht 5\' 11"  (1.803 m)   Wt (!) 145 kg   SpO2 94%   BMI 44.58 kg/m  Physical Exam Vitals and nursing note reviewed.  Constitutional:      Appearance: He is obese. He is not ill-appearing or toxic-appearing.  HENT:     Head: Normocephalic and atraumatic.     Mouth/Throat:     Mouth: Mucous membranes are moist.     Pharynx: No oropharyngeal exudate or posterior oropharyngeal erythema.  Eyes:     General:        Right eye: No discharge.        Left eye:  No discharge.     Conjunctiva/sclera: Conjunctivae normal.  Cardiovascular:     Rate and Rhythm: Normal rate and regular rhythm.     Pulses: Normal pulses.     Heart sounds: Normal heart sounds. No murmur heard. Pulmonary:     Effort: Pulmonary effort is normal. No respiratory distress.     Breath sounds: Normal breath sounds. No wheezing or rales.  Abdominal:     General: Bowel sounds are normal. There is no distension.     Palpations: Abdomen is soft.     Tenderness: There is no abdominal tenderness. There is no guarding or rebound.  Musculoskeletal:        General: No deformity.     Cervical back: Neck supple.     Right lower leg: No edema.       Legs:  Skin:    General: Skin is warm and dry.     Capillary Refill: Capillary refill takes less than 2 seconds.      Findings: Erythema present.  Neurological:     General: No focal deficit present.     Mental Status: He is alert and oriented to person, place, and time. Mental status is at baseline.  Psychiatric:        Mood and Affect: Mood normal.     ED Results / Procedures / Treatments   Labs (all labs ordered are listed, but only abnormal results are displayed) Labs Reviewed  CBC WITH DIFFERENTIAL/PLATELET - Abnormal; Notable for the following components:      Result Value   WBC 17.7 (*)    Neutro Abs 14.8 (*)    Monocytes Absolute 1.5 (*)    Abs Immature Granulocytes 0.08 (*)    All other components within normal limits  COMPREHENSIVE METABOLIC PANEL WITH GFR - Abnormal; Notable for the following components:   Sodium 131 (*)    Potassium 3.1 (*)    Chloride 94 (*)    Glucose, Bld 106 (*)    Calcium 8.7 (*)    Albumin 3.1 (*)    AST 143 (*)    ALT 53 (*)    All other components within normal limits  HIV ANTIBODY (ROUTINE TESTING W REFLEX)  I-STAT CG4 LACTIC ACID, ED    EKG None  Radiology US Venous Img Lower Unilateral Left Result Date: 04/28/2023 CLINICAL DATA:  Left lower extremity swelling edema EXAM: LEFT LOWER EXTREMITY VENOUS DOPPLER ULTRASOUND TECHNIQUE: Gray-scale sonography with compression, as well as color and duplex ultrasound, were performed to evaluate the deep venous system(s) from the level of the common femoral vein through the popliteal and proximal calf veins. COMPARISON:  None available FINDINGS: VENOUS Normal compressibility of the common femoral, superficial femoral, and popliteal veins, as well as the visualized calf veins. Visualized portions of profunda femoral vein and great saphenous vein unremarkable. No filling defects to suggest DVT on grayscale or color Doppler imaging. Doppler waveforms show normal direction of venous flow, normal respiratory plasticity and response to augmentation. Limited views of the contralateral common femoral vein are unremarkable.  OTHER Mildly enlarged lymph nodes seen in the left upper medial thigh measuring 1.3 cm in short axis Limitations: none IMPRESSION: 1. No DVT of the left lower extremity. 2. Nonspecific mildly enlarged left medial thigh lymph node, most likely reactive. Recommend follow-up ultrasound in 10-12 weeks to document resolution. Electronically Signed   By: Acquanetta Belling M.D.   On: 04/28/2023 12:29    Procedures Procedures    Medications Ordered in ED Medications  vancomycin (VANCOREADY) IVPB 2000 mg/400 mL (2,000 mg Intravenous New Bag/Given 04/30/23 0416)  allopurinol (ZYLOPRIM) tablet 100 mg (has no administration in time range)  citalopram (CELEXA) tablet 10 mg (has no administration in time range)  enoxaparin (LOVENOX) injection 70 mg (has no administration in time range)  sodium chloride flush (NS) 0.9 % injection 3 mL (has no administration in time range)  sodium chloride flush (NS) 0.9 % injection 3 mL (has no administration in time range)  sodium chloride flush (NS) 0.9 % injection 3 mL (has no administration in time range)  0.9 %  sodium chloride infusion (has no administration in time range)  acetaminophen (TYLENOL) tablet 650 mg (has no administration in time range)    Or  acetaminophen (TYLENOL) suppository 650 mg (has no administration in time range)  ondansetron (ZOFRAN) tablet 4 mg (has no administration in time range)    Or  ondansetron (ZOFRAN) injection 4 mg (has no administration in time range)  albuterol (PROVENTIL) (2.5 MG/3ML) 0.083% nebulizer solution 2.5 mg (has no administration in time range)  butalbital-acetaminophen-caffeine (FIORICET) 50-325-40 MG per tablet 1 tablet (has no administration in time range)  butalbital-acetaminophen-caffeine (FIORICET) 50-325-40 MG per tablet 1 tablet (has no administration in time range)  ceFEPIme (MAXIPIME) 2 g in sodium chloride 0.9 % 100 mL IVPB (has no administration in time range)  vancomycin (VANCOREADY) IVPB 1250 mg/250 mL (has no  administration in time range)  acetaminophen (TYLENOL) tablet 650 mg (650 mg Oral Given 04/29/23 1923)  ceFEPIme (MAXIPIME) 2 g in sodium chloride 0.9 % 100 mL IVPB (0 g Intravenous Stopped 04/30/23 0409)  fentaNYL (SUBLIMAZE) injection 50 mcg (50 mcg Intravenous Given 04/30/23 0335)  potassium chloride SA (KLOR-CON M) CR tablet 40 mEq (40 mEq Oral Given 04/30/23 0414)    ED Course/ Medical Decision Making/ A&P Clinical Course as of 04/30/23 0603  Fri Apr 30, 2023  0336 Consult to Dr. Janalyn Shy, hospitalist, who is agreeable to admitting this patient to her service. I appreciate her collaboration in the care of this patient.  [RS]    Clinical Course User Index [RS] Chenae Brager, Eugene Gavia, PA-C                                 Medical Decision Making 38 y/o male who presents with concern for left lower extremity redness swelling pain and new fever  Vitals are normal at time of my arrival to the bedside.  Cardiopulmonary is unremarkable, no exam is benign.  Neurovascular status in the leg is not compromised but cellulitis does appear to be worsening when compared to marking pen from yesterday.  Dx includes limited to DVT which has been ruled out with DVT study yesterday, cellulitis, necrotizing fasciitis, compartment syndrome.  Compartments are soft at this time.  Amount and/or Complexity of Data Reviewed Labs:     Details: CBC leukocytosis of 17, CMP with hyponatremia 131, hypokalemia of 3.1, Transmitted with AST/ALT 143/53.  Lactic acid is normal.    Risk Prescription drug management. Decision regarding hospitalization.   Clinically patient's cellulitis appears to be worsened as erythema has extended beyond a marking pen from 4/9.  New marking pen applied today on 4/10.  Patient with notable swelling and redness to the lower extremity but without vascular compromise.  Normal range of motion of the ankle and toes.  Patient appears to have failed outpatient antibiotic therapy, I do feel he  would benefit from mission  to the hospital at this time for IV antibiotics.  Does not currently meet sepsis criteria, however concern for fever.  Consult the hospitalist as above.  Agrees with plan for Vanco and cefepime.  Loistine Chance voiced understanding of his medical evaluation and treatment plan. Each of their questions answered to their expressed satisfaction.  He is amenable to plan for admission.  This chart was dictated using voice recognition software, Dragon. Despite the best efforts of this provider to proofread and correct errors, errors may still occur which can change documentation meaning.         Final Clinical Impression(s) / ED Diagnoses Final diagnoses:  Cellulitis of left lower extremity    Rx / DC Orders ED Discharge Orders     None         Sherrilee Gilles 04/30/23 0603    Palumbo, April, MD 04/30/23 [phone]

## 2023-05-01 DIAGNOSIS — L03116 Cellulitis of left lower limb: Secondary | ICD-10-CM | POA: Diagnosis not present

## 2023-05-01 LAB — COMPREHENSIVE METABOLIC PANEL WITH GFR
ALT: 52 U/L — ABNORMAL HIGH (ref 0–44)
AST: 85 U/L — ABNORMAL HIGH (ref 15–41)
Albumin: 2.7 g/dL — ABNORMAL LOW (ref 3.5–5.0)
Alkaline Phosphatase: 58 U/L (ref 38–126)
Anion gap: 10 (ref 5–15)
BUN: 5 mg/dL — ABNORMAL LOW (ref 6–20)
CO2: 26 mmol/L (ref 22–32)
Calcium: 8.6 mg/dL — ABNORMAL LOW (ref 8.9–10.3)
Chloride: 100 mmol/L (ref 98–111)
Creatinine, Ser: 0.94 mg/dL (ref 0.61–1.24)
GFR, Estimated: >60 mL/min (ref >60)
Glucose, Bld: 128 mg/dL — ABNORMAL HIGH (ref 70–99)
Potassium: 3.7 mmol/L (ref 3.5–5.1)
Sodium: 136 mmol/L (ref 135–145)
Total Bilirubin: 0.5 mg/dL (ref 0.0–1.2)
Total Protein: 6.6 g/dL (ref 6.5–8.1)

## 2023-05-01 LAB — CBC
HCT: 40.6 % (ref 39.0–52.0)
Hemoglobin: 13.9 g/dL (ref 13.0–17.0)
MCH: 30.2 pg (ref 26.0–34.0)
MCHC: 34.2 g/dL (ref 30.0–36.0)
MCV: 88.3 fL (ref 80.0–100.0)
Platelets: 270 10*3/uL (ref 150–400)
RBC: 4.6 MIL/uL (ref 4.22–5.81)
RDW: 14.7 % (ref 11.5–15.5)
WBC: 13.7 10*3/uL — ABNORMAL HIGH (ref 4.0–10.5)
nRBC: 0 % (ref 0.0–0.2)

## 2023-05-01 LAB — URINALYSIS, W/ REFLEX TO CULTURE (INFECTION SUSPECTED)
Bilirubin Urine: NEGATIVE
Glucose, UA: NEGATIVE mg/dL
Ketones, ur: NEGATIVE mg/dL
Leukocytes,Ua: NEGATIVE
Nitrite: NEGATIVE
Protein, ur: NEGATIVE mg/dL
RBC / HPF: >50 RBC/hpf (ref 0–5)
Specific Gravity, Urine: 1.014 (ref 1.005–1.030)
pH: 6 (ref 5.0–8.0)

## 2023-05-01 LAB — MAGNESIUM: Magnesium: 2.3 mg/dL (ref 1.7–2.4)

## 2023-05-01 LAB — PHOSPHORUS: Phosphorus: 2 mg/dL — ABNORMAL LOW (ref 2.5–4.6)

## 2023-05-01 MED ORDER — K PHOS MONO-SOD PHOS DI & MONO 155-852-130 MG PO TABS
250.0000 mg | ORAL_TABLET | Freq: Three times a day (TID) | ORAL | Status: DC
  Administered 2023-05-01 – 2023-05-02 (×3): 250 mg via ORAL
  Filled 2023-05-01 (×3): qty 1

## 2023-05-01 NOTE — Progress Notes (Signed)
 PROGRESS NOTE    KAYVEN ALDACO  NFA:[id number] DOB: Sep 09, 1985 DOA: 04/29/2023 PCP: Towanda Fret, MD    Brief Narrative:  38 year old with history of asthma, HTN, GAD, gout, peripheral neuropathy, unprovoked DVT in 2020 comes to the ED with fever and left leg swelling.  He was diagnosed with left lower extremity cellulitis on 4/9 and was placed on Bactrim but due to worsening return back to the ER.  Dopplers on 4/9 were negative for DVT.  Currently started on vancomycin and cefepime.  Now on IV vancomycin.  Slow clinical improvement.   Assessment & Plan:  Principal Problem:   Cellulitis of left lower extremity Active Problems:   Hypokalemia   Hyponatremia   Essential hypertension   Generalized anxiety disorder   History of DVT (deep vein thrombosis)   Asthma, chronic   Gout   Neuropathy    Cellulitis of the left lower extremity Failed outpatient therapy.  Still with significant swelling and cellulitis. Continue vancomycin today.  WBC count improving.  Mobilize. DVT studies were negative. Blood cultures negative so far.  Hypokalemia - Replaced and adequate.  Replace phosphorus.   Chronic hyponatremia - Chronic.  Will give IV fluids     Essential hypertension - Due to soft pressures initially amlodipine and HCTZ were on hold.  Resume.    Generalized anxiety disorder -Continue Celexa.   History of unprovoked DVT in 2020 -Patient is currently not on any anticoagulation.  DVT study was negative.   Asthma Stable.  Continue albuterol as needed.   Gout Continue allopurinol.  Chronic headache - Continue Fioricet as needed  DVT prophylaxis: SCDs Start: 04/30/23 0519 Place TED hose Start: 04/30/23 0519   Code Status: Full Code Family Communication: Mother at bedside. Remains in the hospital.  Needs IV antibiotics.   Subjective: Patient seen and examined.  Mother at bedside.  Redness and erythema is improving but is still significant swelling of the  left leg.  Pain is controlled.  Mostly using Tylenol.  Occasional use of oxycodone.  Afebrile last 24 hours.   Examination:  General exam: Appears calm and comfortable.  On room air. Respiratory system: Clear to auscultation. Respiratory effort normal. Cardiovascular system: S1 & S2 heard, RRR. No JVD, murmurs, rubs, gallops or clicks. No pedal edema. Gastrointestinal system: Abdomen is nondistended, soft and nontender. No organomegaly or masses felt. Normal bowel sounds heard. Central nervous system: Alert and oriented. No focal neurological deficits. Extremities: Symmetric 5 x 5 power. Skin:  Patient has redness and erythema left lower extremity, has skin pencil marks with receding margins.  No palpable abscess.                Diet Orders (From admission, onward)     Start     Ordered   04/30/23 0519  Diet Heart Room service appropriate? Yes; Fluid consistency: Thin  Diet effective now       Question Answer Comment  Room service appropriate? Yes   Fluid consistency: Thin      04/30/23 0518            Objective: Vitals:   05/01/23 0300 05/01/23 0415 05/01/23 0805 05/01/23 0840  BP:  127/84 130/67 121/64  Pulse:  77 84 73  Resp:  16 18 20   Temp: (!) 100.6 F (38.1 C) 98.5 F (36.9 C) 98.3 F (36.8 C) 98.2 F (36.8 C)  TempSrc:  Oral Oral Oral  SpO2:  92% 94% 94%  Weight:      Height:  Intake/Output Summary (Last 24 hours) at 05/01/2023 1449 Last data filed at 05/01/2023 0400 Gross per 24 hour  Intake 1218.36 ml  Output --  Net 1218.36 ml   Filed Weights   04/29/23 1828  Weight: (!) 145 kg    Scheduled Meds:  allopurinol  100 mg Oral Daily   citalopram  10 mg Oral Daily   enoxaparin (LOVENOX) injection  70 mg Subcutaneous Q24H   sodium chloride flush  3 mL Intravenous Q12H   Continuous Infusions:  vancomycin 1,250 mg (05/01/23 0457)    Nutritional status     Body mass index is 44.58 kg/m.  Data Reviewed:   CBC: Recent Labs   Lab 04/28/23 2056 04/29/23 1926 05/01/23 0354  WBC 20.7* 17.7* 13.7*  NEUTROABS 19.0* 14.8*  --   HGB 15.1 15.2 13.9  HCT 43.9 45.5 40.6  MCV 88.0 88.9 88.3  PLT 233 233 270   Basic Metabolic Panel: Recent Labs  Lab 04/28/23 2056 04/29/23 1926 05/01/23 0354  NA 133* 131* 136  K 3.1* 3.1* 3.7  CL 97* 94* 100  CO2 26 26 26   GLUCOSE 120* 106* 128*  BUN 10 7 5*  CREATININE 1.04 1.16 0.94  CALCIUM 8.9 8.7* 8.6*  MG  --   --  2.3  PHOS  --   --  2.0*   GFR: Estimated Creatinine Clearance: 157.1 mL/min (by C-G formula based on SCr of 0.94 mg/dL). Liver Function Tests: Recent Labs  Lab 04/28/23 2056 04/29/23 1926 05/01/23 0354  AST 76* 143* 85*  ALT 40 53* 52*  ALKPHOS 61 68 58  BILITOT 0.7 0.7 0.5  PROT 7.3 7.3 6.6  ALBUMIN 3.3* 3.1* 2.7*   Recent Labs  Lab 04/28/23 2056  LIPASE 22   No results for input(s): "AMMONIA" in the last 168 hours. Coagulation Profile: No results for input(s): "INR", "PROTIME" in the last 168 hours. Cardiac Enzymes: No results for input(s): "CKTOTAL", "CKMB", "CKMBINDEX", "TROPONINI" in the last 168 hours. BNP (last 3 results) No results for input(s): "PROBNP" in the last 8760 hours. HbA1C: No results for input(s): "HGBA1C" in the last 72 hours. CBG: No results for input(s): "GLUCAP" in the last 168 hours. Lipid Profile: No results for input(s): "CHOL", "HDL", "LDLCALC", "TRIG", "CHOLHDL", "LDLDIRECT" in the last 72 hours. Thyroid Function Tests: No results for input(s): "TSH", "T4TOTAL", "FREET4", "T3FREE", "THYROIDAB" in the last 72 hours. Anemia Panel: No results for input(s): "VITAMINB12", "FOLATE", "FERRITIN", "TIBC", "IRON", "RETICCTPCT" in the last 72 hours. Sepsis Labs: Recent Labs  Lab 04/28/23 2056 04/29/23 1932  LATICACIDVEN 1.7 1.4    Recent Results (from the past 240 hours)  Resp panel by RT-PCR (RSV, Flu A&B, Covid) Anterior Nasal Swab     Status: None   Collection Time: 04/27/23 11:29 AM   Specimen:  Anterior Nasal Swab  Result Value Ref Range Status   SARS Coronavirus 2 by RT PCR NEGATIVE NEGATIVE Final    Comment: (NOTE) SARS-CoV-2 target nucleic acids are NOT DETECTED.  The SARS-CoV-2 RNA is generally detectable in upper respiratory specimens during the acute phase of infection. The lowest concentration of SARS-CoV-2 viral copies this assay can detect is 138 copies/mL. A negative result does not preclude SARS-Cov-2 infection and should not be used as the sole basis for treatment or other patient management decisions. A negative result may occur with  improper specimen collection/handling, submission of specimen other than nasopharyngeal swab, presence of viral mutation(s) within the areas targeted by this assay, and inadequate number of viral  copies(<138 copies/mL). A negative result must be combined with clinical observations, patient history, and epidemiological information. The expected result is Negative.  Fact Sheet for Patients:  BloggerCourse.com  Fact Sheet for Healthcare Providers:  SeriousBroker.it  This test is no t yet approved or cleared by the [place]  FDA and  has been authorized for detection and/or diagnosis of SARS-CoV-2 by FDA under an Emergency Use Authorization (EUA). This EUA will remain  in effect (meaning this test can be used) for the duration of the COVID-19 declaration under Section 564(b)(1) of the Act, 21 U.S.C.section 360bbb-3(b)(1), unless the authorization is terminated  or revoked sooner.       Influenza A by PCR NEGATIVE NEGATIVE Final   Influenza B by PCR NEGATIVE NEGATIVE Final    Comment: (NOTE) The Xpert Xpress SARS-CoV-2/FLU/RSV plus assay is intended as an aid in the diagnosis of influenza from Nasopharyngeal swab specimens and should not be used as a sole basis for treatment. Nasal washings and aspirates are unacceptable for Xpert Xpress SARS-CoV-2/FLU/RSV testing.  Fact  Sheet for Patients: BloggerCourse.com  Fact Sheet for Healthcare Providers: SeriousBroker.it  This test is not yet approved or cleared by the [place]  FDA and has been authorized for detection and/or diagnosis of SARS-CoV-2 by FDA under an Emergency Use Authorization (EUA). This EUA will remain in effect (meaning this test can be used) for the duration of the COVID-19 declaration under Section 564(b)(1) of the Act, 21 U.S.C. section 360bbb-3(b)(1), unless the authorization is terminated or revoked.     Resp Syncytial Virus by PCR NEGATIVE NEGATIVE Final    Comment: (NOTE) Fact Sheet for Patients: BloggerCourse.com  Fact Sheet for Healthcare Providers: SeriousBroker.it  This test is not yet approved or cleared by the [place]  FDA and has been authorized for detection and/or diagnosis of SARS-CoV-2 by FDA under an Emergency Use Authorization (EUA). This EUA will remain in effect (meaning this test can be used) for the duration of the COVID-19 declaration under Section 564(b)(1) of the Act, 21 U.S.C. section 360bbb-3(b)(1), unless the authorization is terminated or revoked.  Performed at [facility], [address] [place], [place] [address]   Blood culture (routine x 2)     Status: None (Preliminary result)   Collection Time: 04/28/23  8:56 PM   Specimen: BLOOD LEFT FOREARM  Result Value Ref Range Status   Specimen Description   Final    BLOOD LEFT FOREARM Performed at [facility], [address], [place], [place] [address]    Special Requests   Final    BOTTLES DRAWN AEROBIC AND ANAEROBIC Blood Culture adequate volume Performed at [facility], [address] [place], [place] [address]    Culture   Final    NO GROWTH 2 DAYS Performed at [facility], [address], [place], [place] [address]    Report Status PENDING   Incomplete  Blood culture (routine x 2)     Status: None (Preliminary result)   Collection Time: 04/28/23  8:56 PM   Specimen: BLOOD  Result Value Ref Range Status   Specimen Description   Final    BLOOD LEFT ANTECUBITAL Performed at [facility], [address] [place], [place] [address]    Special Requests   Final    BOTTLES DRAWN AEROBIC AND ANAEROBIC Blood Culture adequate volume Performed at [facility], [address] [place], [place] [address]    Culture  Final    NO GROWTH 2 DAYS Performed at [facility], [address] [place], [place] [address]    Report Status PENDING  Incomplete         Radiology Studies: No results found.         LOS: 1 day   Time spent= 35 mins    Vada Garibaldi, MD [facility]  If 7PM-7AM, please contact night-coverage  05/01/2023, 2:49 PM

## 2023-05-01 NOTE — Plan of Care (Signed)

## 2023-05-02 DIAGNOSIS — L03116 Cellulitis of left lower limb: Secondary | ICD-10-CM | POA: Diagnosis not present

## 2023-05-02 LAB — CBC
HCT: 41.7 % (ref 39.0–52.0)
Hemoglobin: 14.3 g/dL (ref 13.0–17.0)
MCH: 30.2 pg (ref 26.0–34.0)
MCHC: 34.3 g/dL (ref 30.0–36.0)
MCV: 88 fL (ref 80.0–100.0)
Platelets: 312 K/uL (ref 150–400)
RBC: 4.74 MIL/uL (ref 4.22–5.81)
RDW: 14.6 % (ref 11.5–15.5)
WBC: 13.2 K/uL — ABNORMAL HIGH (ref 4.0–10.5)
nRBC: 0 % (ref 0.0–0.2)

## 2023-05-02 MED ORDER — CEPHALEXIN 500 MG PO CAPS: 500.0000 mg | ORAL_CAPSULE | Freq: Four times a day (QID) | ORAL | 0 refills | Status: AC

## 2023-05-02 MED ORDER — K PHOS MONO-SOD PHOS DI & MONO 155-852-130 MG PO TABS: 250.0000 mg | ORAL_TABLET | Freq: Three times a day (TID) | ORAL | 0 refills | Status: AC

## 2023-05-02 MED ORDER — SULFAMETHOXAZOLE-TRIMETHOPRIM 800-160 MG PO TABS: 1.0000 | ORAL_TABLET | Freq: Two times a day (BID) | ORAL | 0 refills | Status: AC

## 2023-05-02 NOTE — Plan of Care (Signed)

## 2023-05-02 NOTE — Discharge Summary (Signed)
 Physician Discharge Summary  Danny Gomez ZOX:[id number] DOB: [birth year] DOA: 04/29/2023  PCP: Towanda Fret, MD  Admit date: 04/29/2023 Discharge date: 05/02/2023  Admitted From: Home Disposition: Home  Recommendations for Outpatient Follow-up:  Follow up with PCP in 1-2 weeks Please obtain BMP/CBC in one week   Home Health: N/A Equipment/Devices: N/A  Discharge Condition: Stable CODE STATUS: Full code Diet recommendation: Low-salt diet  Discharge summary: 38 year old with history of asthma, HTN, GAD, gout, peripheral neuropathy, unprovoked DVT in 2020 comes to the ED with fever and left leg swelling.  He was diagnosed with left lower extremity cellulitis on 4/9 and was placed on Bactrim but due to worsening came back to the ER.  Dopplers on 4/9 were negative for DVT.  started on vancomycin and cefepime.    Cellulitis of the left lower extremity: Failed outpatient therapy. He had taken 2 days of Bactrim.  Primary cause unknown.  Treated with vancomycin.  Leukocytosis improved.  Clinically improved.  Redness and erythema is receding.  Does not have any evidence of underlying abscess. Blood cultures negative. With clinical improvement, discharging patient home with 7 additional days of Bactrim and Keflex for broad-spectrum coverage.  Advised compression socks.  Follow-up with PCP in 1 week. Dopplers were negative.  Chronic medical issues including Hypertension, anxiety disorder, asthma.  They are stable.    Discharge Diagnoses:  Principal Problem:   Cellulitis of left lower extremity Active Problems:   Hypokalemia   Hyponatremia   Essential hypertension   Generalized anxiety disorder   History of DVT (deep vein thrombosis)   Asthma, chronic   Gout   Neuropathy    Discharge Instructions  Discharge Instructions     Diet - low sodium heart healthy   Complete by: As directed    Discharge instructions   Complete by: As directed    Compression socks to the  legs   Increase activity slowly   Complete by: As directed       Allergies as of 05/02/2023       Reactions   Lisinopril Cough        Medication List     TAKE these medications    acetaminophen 650 MG CR tablet Commonly known as: TYLENOL Take 1,300 mg by mouth every 8 (eight) hours as needed for pain.   allopurinol 100 MG tablet Commonly known as: ZYLOPRIM Take 1 tablet by mouth once daily   amLODipine 10 MG tablet Commonly known as: NORVASC Take 1 tablet (10 mg total) by mouth daily.   cephALEXin 500 MG capsule Commonly known as: KEFLEX Take 1 capsule (500 mg total) by mouth 4 (four) times daily for 7 days.   citalopram 10 MG tablet Commonly known as: CELEXA Take 1 tablet (10 mg total) by mouth daily.   fluticasone 50 MCG/ACT nasal spray Commonly known as: FLONASE Place 2 sprays into both nostrils daily.   ondansetron 4 MG disintegrating tablet Commonly known as: ZOFRAN-ODT Take 1 tablet (4 mg total) by mouth every 8 (eight) hours as needed for nausea or vomiting.   oxyCODONE 5 MG immediate release tablet Commonly known as: Roxicodone Take 1 tablet (5 mg total) by mouth every 4 (four) hours as needed for severe pain (pain score 7-10).   phosphorus 155-852-130 MG tablet Commonly known as: K PHOS NEUTRAL Take 1 tablet (250 mg total) by mouth 3 (three) times daily for 3 days.   potassium chloride SA 20 MEQ tablet Commonly known as: KLOR-CON M Take 2 tablets (40  mEq total) by mouth once for 1 dose.   sulfamethoxazole-trimethoprim 800-160 MG tablet Commonly known as: BACTRIM DS Take 1 tablet by mouth 2 (two) times daily for 5 days.   triamterene-hydrochlorothiazide 37.5-25 MG tablet Commonly known as: MAXZIDE-25 Take 1 tablet by mouth once daily        Allergies  Allergen Reactions   Lisinopril Cough    Consultations: None   Procedures/Studies: US  Venous Img Lower Unilateral Left Result Date: 04/28/2023 CLINICAL DATA:  Left lower extremity  swelling edema EXAM: LEFT LOWER EXTREMITY VENOUS DOPPLER ULTRASOUND TECHNIQUE: Gray-scale sonography with compression, as well as color and duplex ultrasound, were performed to evaluate the deep venous system(s) from the level of the common femoral vein through the popliteal and proximal calf veins. COMPARISON:  None available FINDINGS: VENOUS Normal compressibility of the common femoral, superficial femoral, and popliteal veins, as well as the visualized calf veins. Visualized portions of profunda femoral vein and great saphenous vein unremarkable. No filling defects to suggest DVT on grayscale or color Doppler imaging. Doppler waveforms show normal direction of venous flow, normal respiratory plasticity and response to augmentation. Limited views of the contralateral common femoral vein are unremarkable. OTHER Mildly enlarged lymph nodes seen in the left upper medial thigh measuring 1.3 cm in short axis Limitations: none IMPRESSION: 1. No DVT of the left lower extremity. 2. Nonspecific mildly enlarged left medial thigh lymph node, most likely reactive. Recommend follow-up ultrasound in 10-12 weeks to document resolution. Electronically Signed   By: Elester Grim M.D.   On: 04/28/2023 12:29   (Echo, Carotid, EGD, Colonoscopy, ERCP)    Subjective: Patient seen and examined.  Mother at the bedside.  Has mild to moderate pain.  Redness and erythema has much improved except at the lateral aspect of the calf.  Walking around.  Afebrile.   Discharge Exam: Vitals:   05/01/23 2142 05/02/23 0511  BP: 130/68 (!) 119/49  Pulse: 75 76  Resp: 16 16  Temp: 98.8 F (37.1 C) 98.7 F (37.1 C)  SpO2: 98% 95%   Vitals:   05/01/23 0840 05/01/23 1602 05/01/23 2142 05/02/23 0511  BP: 121/64 117/70 130/68 (!) 119/49  Pulse: 73 69 75 76  Resp: 20 18 16 16   Temp: 98.2 F (36.8 C) 98.2 F (36.8 C) 98.8 F (37.1 C) 98.7 F (37.1 C)  TempSrc: Oral  Oral Oral  SpO2: 94% 99% 98% 95%  Weight:      Height:         General: Pt is alert, awake, not in acute distress Cardiovascular: RRR, S1/S2 +, no rubs, no gallops Respiratory: CTA bilaterally, no wheezing, no rhonchi Abdominal: Soft, NT, ND, bowel sounds + Extremities:  1+ edema left leg, redness and erythema with receding margins from pencil marks.  No palpable abscess, no palpable fluctuation.  No open drainage.  Picture in the chart.     The results of significant diagnostics from this hospitalization (including imaging, microbiology, ancillary and laboratory) are listed below for reference.     Microbiology: Recent Results (from the past 240 hours)  Resp panel by RT-PCR (RSV, Flu A&B, Covid) Anterior Nasal Swab     Status: None   Collection Time: 04/27/23 11:29 AM   Specimen: Anterior Nasal Swab  Result Value Ref Range Status   SARS Coronavirus 2 by RT PCR NEGATIVE NEGATIVE Final    Comment: (NOTE) SARS-CoV-2 target nucleic acids are NOT DETECTED.  The SARS-CoV-2 RNA is generally detectable in upper respiratory specimens during the acute  phase of infection. The lowest concentration of SARS-CoV-2 viral copies this assay can detect is 138 copies/mL. A negative result does not preclude SARS-Cov-2 infection and should not be used as the sole basis for treatment or other patient management decisions. A negative result may occur with  improper specimen collection/handling, submission of specimen other than nasopharyngeal swab, presence of viral mutation(s) within the areas targeted by this assay, and inadequate number of viral copies(<138 copies/mL). A negative result must be combined with clinical observations, patient history, and epidemiological information. The expected result is Negative.  Fact Sheet for Patients:  BloggerCourse.com  Fact Sheet for Healthcare Providers:  SeriousBroker.it  This test is no t yet approved or cleared by the [place]  FDA and  has been  authorized for detection and/or diagnosis of SARS-CoV-2 by FDA under an Emergency Use Authorization (EUA). This EUA will remain  in effect (meaning this test can be used) for the duration of the COVID-19 declaration under Section 564(b)(1) of the Act, 21 U.S.C.section 360bbb-3(b)(1), unless the authorization is terminated  or revoked sooner.       Influenza A by PCR NEGATIVE NEGATIVE Final   Influenza B by PCR NEGATIVE NEGATIVE Final    Comment: (NOTE) The Xpert Xpress SARS-CoV-2/FLU/RSV plus assay is intended as an aid in the diagnosis of influenza from Nasopharyngeal swab specimens and should not be used as a sole basis for treatment. Nasal washings and aspirates are unacceptable for Xpert Xpress SARS-CoV-2/FLU/RSV testing.  Fact Sheet for Patients: BloggerCourse.com  Fact Sheet for Healthcare Providers: SeriousBroker.it  This test is not yet approved or cleared by the [place]  FDA and has been authorized for detection and/or diagnosis of SARS-CoV-2 by FDA under an Emergency Use Authorization (EUA). This EUA will remain in effect (meaning this test can be used) for the duration of the COVID-19 declaration under Section 564(b)(1) of the Act, 21 U.S.C. section 360bbb-3(b)(1), unless the authorization is terminated or revoked.     Resp Syncytial Virus by PCR NEGATIVE NEGATIVE Final    Comment: (NOTE) Fact Sheet for Patients: BloggerCourse.com  Fact Sheet for Healthcare Providers: SeriousBroker.it  This test is not yet approved or cleared by the [place]  FDA and has been authorized for detection and/or diagnosis of SARS-CoV-2 by FDA under an Emergency Use Authorization (EUA). This EUA will remain in effect (meaning this test can be used) for the duration of the COVID-19 declaration under Section 564(b)(1) of the Act, 21 U.S.C. section 360bbb-3(b)(1), unless the  authorization is terminated or revoked.  Performed at [facility], [address] [place], [place] [address]   Blood culture (routine x 2)     Status: None (Preliminary result)   Collection Time: 04/28/23  8:56 PM   Specimen: BLOOD LEFT FOREARM  Result Value Ref Range Status   Specimen Description   Final    BLOOD LEFT FOREARM Performed at [facility], [address], [place], [place] [address]    Special Requests   Final    BOTTLES DRAWN AEROBIC AND ANAEROBIC Blood Culture adequate volume Performed at [facility], [address] [place], [place] [address]    Culture   Final    NO GROWTH 3 DAYS Performed at [facility], [address] [place], [place] [address]    Report Status PENDING  Incomplete  Blood culture (routine x 2)     Status: None (Preliminary result)   Collection Time: 04/28/23  8:56 PM  Specimen: BLOOD  Result Value Ref Range Status   Specimen Description   Final    BLOOD LEFT ANTECUBITAL Performed at [facility], [address] [place], [place] [address]    Special Requests   Final    BOTTLES DRAWN AEROBIC AND ANAEROBIC Blood Culture adequate volume Performed at [facility], [address] [place], [place] [address]    Culture   Final    NO GROWTH 3 DAYS Performed at [facility], [address] [place], [place] [address]    Report Status PENDING  Incomplete     Labs: BNP (last 3 results) No results for input(s): "BNP" in the last 8760 hours. Basic Metabolic Panel: Recent Labs  Lab 04/28/23 2056 04/29/23 1926 05/01/23 0354  NA 133* 131* 136  K 3.1* 3.1* 3.7  CL 97* 94* 100  CO2 26 26 26   GLUCOSE 120* 106* 128*  BUN 10 7 5*  CREATININE 1.04 1.16 0.94  CALCIUM 8.9 8.7* 8.6*  MG  --   --  2.3  PHOS  --   --  2.0*   Liver Function Tests: Recent Labs  Lab 04/28/23 2056 04/29/23 1926 05/01/23 0354  AST 76* 143* 85*  ALT 40 53* 52*  ALKPHOS 61 68 58  BILITOT 0.7  0.7 0.5  PROT 7.3 7.3 6.6  ALBUMIN 3.3* 3.1* 2.7*   Recent Labs  Lab 04/28/23 2056  LIPASE 22   No results for input(s): "AMMONIA" in the last 168 hours. CBC: Recent Labs  Lab 04/28/23 2056 04/29/23 1926 05/01/23 0354 05/02/23 0901  WBC 20.7* 17.7* 13.7* 13.2*  NEUTROABS 19.0* 14.8*  --   --   HGB 15.1 15.2 13.9 14.3  HCT 43.9 45.5 40.6 41.7  MCV 88.0 88.9 88.3 88.0  PLT 233 233 270 312   Cardiac Enzymes: No results for input(s): "CKTOTAL", "CKMB", "CKMBINDEX", "TROPONINI" in the last 168 hours. BNP: Invalid input(s): "POCBNP" CBG: No results for input(s): "GLUCAP" in the last 168 hours. D-Dimer No results for input(s): "DDIMER" in the last 72 hours. Hgb A1c No results for input(s): "HGBA1C" in the last 72 hours. Lipid Profile No results for input(s): "CHOL", "HDL", "LDLCALC", "TRIG", "CHOLHDL", "LDLDIRECT" in the last 72 hours. Thyroid function studies No results for input(s): "TSH", "T4TOTAL", "T3FREE", "THYROIDAB" in the last 72 hours.  Invalid input(s): "FREET3" Anemia work up No results for input(s): "VITAMINB12", "FOLATE", "FERRITIN", "TIBC", "IRON", "RETICCTPCT" in the last 72 hours. Urinalysis    Component Value Date/Time   COLORURINE YELLOW 05/01/2023 1359   APPEARANCEUR CLEAR 05/01/2023 1359   LABSPEC 1.014 05/01/2023 1359   PHURINE 6.0 05/01/2023 1359   GLUCOSEU NEGATIVE 05/01/2023 1359   HGBUR MODERATE (A) 05/01/2023 1359   BILIRUBINUR NEGATIVE 05/01/2023 1359   BILIRUBINUR negative 05/11/2013 1657   KETONESUR NEGATIVE 05/01/2023 1359   PROTEINUR NEGATIVE 05/01/2023 1359   UROBILINOGEN 0.2 05/11/2013 1657   NITRITE NEGATIVE 05/01/2023 1359   LEUKOCYTESUR NEGATIVE 05/01/2023 1359   Sepsis Labs Recent Labs  Lab 04/28/23 2056 04/29/23 1926 05/01/23 0354 05/02/23 0901  WBC 20.7* 17.7* 13.7* 13.2*   Microbiology Recent Results (from the past 240 hours)  Resp panel by RT-PCR (RSV, Flu A&B, Covid) Anterior Nasal Swab     Status: None    Collection Time: 04/27/23 11:29 AM   Specimen: Anterior Nasal Swab  Result Value Ref Range Status   SARS Coronavirus 2 by RT PCR NEGATIVE NEGATIVE Final    Comment: (NOTE) SARS-CoV-2 target  nucleic acids are NOT DETECTED.  The SARS-CoV-2 RNA is generally detectable in upper respiratory specimens during the acute phase of infection. The lowest concentration of SARS-CoV-2 viral copies this assay can detect is 138 copies/mL. A negative result does not preclude SARS-Cov-2 infection and should not be used as the sole basis for treatment or other patient management decisions. A negative result may occur with  improper specimen collection/handling, submission of specimen other than nasopharyngeal swab, presence of viral mutation(s) within the areas targeted by this assay, and inadequate number of viral copies(<138 copies/mL). A negative result must be combined with clinical observations, patient history, and epidemiological information. The expected result is Negative.  Fact Sheet for Patients:  BloggerCourse.com  Fact Sheet for Healthcare Providers:  SeriousBroker.it  This test is no t yet approved or cleared by the [place]  FDA and  has been authorized for detection and/or diagnosis of SARS-CoV-2 by FDA under an Emergency Use Authorization (EUA). This EUA will remain  in effect (meaning this test can be used) for the duration of the COVID-19 declaration under Section 564(b)(1) of the Act, 21 U.S.C.section 360bbb-3(b)(1), unless the authorization is terminated  or revoked sooner.       Influenza A by PCR NEGATIVE NEGATIVE Final   Influenza B by PCR NEGATIVE NEGATIVE Final    Comment: (NOTE) The Xpert Xpress SARS-CoV-2/FLU/RSV plus assay is intended as an aid in the diagnosis of influenza from Nasopharyngeal swab specimens and should not be used as a sole basis for treatment. Nasal washings and aspirates are unacceptable for  Xpert Xpress SARS-CoV-2/FLU/RSV testing.  Fact Sheet for Patients: BloggerCourse.com  Fact Sheet for Healthcare Providers: SeriousBroker.it  This test is not yet approved or cleared by the [place]  FDA and has been authorized for detection and/or diagnosis of SARS-CoV-2 by FDA under an Emergency Use Authorization (EUA). This EUA will remain in effect (meaning this test can be used) for the duration of the COVID-19 declaration under Section 564(b)(1) of the Act, 21 U.S.C. section 360bbb-3(b)(1), unless the authorization is terminated or revoked.     Resp Syncytial Virus by PCR NEGATIVE NEGATIVE Final    Comment: (NOTE) Fact Sheet for Patients: BloggerCourse.com  Fact Sheet for Healthcare Providers: SeriousBroker.it  This test is not yet approved or cleared by the [place]  FDA and has been authorized for detection and/or diagnosis of SARS-CoV-2 by FDA under an Emergency Use Authorization (EUA). This EUA will remain in effect (meaning this test can be used) for the duration of the COVID-19 declaration under Section 564(b)(1) of the Act, 21 U.S.C. section 360bbb-3(b)(1), unless the authorization is terminated or revoked.  Performed at [facility], [address] [place], [place] [address]   Blood culture (routine x 2)     Status: None (Preliminary result)   Collection Time: 04/28/23  8:56 PM   Specimen: BLOOD LEFT FOREARM  Result Value Ref Range Status   Specimen Description   Final    BLOOD LEFT FOREARM Performed at [facility], [address], [place], [place] [address]    Special Requests   Final    BOTTLES DRAWN AEROBIC AND ANAEROBIC Blood Culture adequate volume Performed at [facility], [address] [place], [place] [address]    Culture   Final    NO GROWTH 3 DAYS Performed at [facility], [address],  [place], [place] [address]    Report Status PENDING  Incomplete  Blood culture (routine x  2)     Status: None (Preliminary result)   Collection Time: 04/28/23  8:56 PM   Specimen: BLOOD  Result Value Ref Range Status   Specimen Description   Final    BLOOD LEFT ANTECUBITAL Performed at [facility], [address] [place], [place] [address]    Special Requests   Final    BOTTLES DRAWN AEROBIC AND ANAEROBIC Blood Culture adequate volume Performed at [facility], [address] [place], [place] [address]    Culture   Final    NO GROWTH 3 DAYS Performed at [facility], [address], [place], [place] [address]    Report Status PENDING  Incomplete     Time coordinating discharge: 35 minutes  SIGNED:   Vada Garibaldi, MD  [facility] 05/02/2023, 9:41 AM

## 2023-05-02 NOTE — Progress Notes (Signed)
 Patient being discharged via private transportation, iv access removed, discharged packet reviewed at bedside.

## 2023-05-03 ENCOUNTER — Telehealth: Payer: Self-pay

## 2023-05-03 NOTE — Telephone Encounter (Signed)
 j

## 2023-05-04 LAB — CULTURE, BLOOD (ROUTINE X 2)
Culture: NO GROWTH
Culture: NO GROWTH
Special Requests: ADEQUATE
Special Requests: ADEQUATE

## 2023-05-06 ENCOUNTER — Ambulatory Visit: Admitting: Family Medicine

## 2023-05-06 ENCOUNTER — Encounter: Payer: Self-pay | Admitting: Family Medicine

## 2023-05-06 VITALS — BP 123/84 | HR 79 | Ht 71.0 in | Wt 319.1 lb

## 2023-05-06 DIAGNOSIS — Z09 Encounter for follow-up examination after completed treatment for conditions other than malignant neoplasm: Secondary | ICD-10-CM | POA: Insufficient documentation

## 2023-05-06 DIAGNOSIS — Z136 Encounter for screening for cardiovascular disorders: Secondary | ICD-10-CM

## 2023-05-06 DIAGNOSIS — M79605 Pain in left leg: Secondary | ICD-10-CM | POA: Diagnosis not present

## 2023-05-06 MED ORDER — ALLOPURINOL 100 MG PO TABS: 100.0000 mg | ORAL_TABLET | Freq: Every day | ORAL | 0 refills | Status: DC

## 2023-05-06 MED ORDER — TRIAMTERENE-HCTZ 37.5-25 MG PO TABS: 1.0000 | ORAL_TABLET | Freq: Every day | ORAL | 0 refills | Status: DC

## 2023-05-06 NOTE — Progress Notes (Signed)
 Established Patient Office Visit   Subjective  Patient ID: MOMEN HAM, male    DOB: September 03, 1985  Age: 38 y.o. MRN: [id number]  Chief Complaint  Patient presents with   Hospitalization Follow-up    Cellulitis Discharge on 04/29/23  Pain in left leg, no drainage but pt. Reports experiencing pain when standing     He  has a past medical history of Asthma, Febrile illness, Headache(784.0), Hypertension, Neuromuscular disorder (HCC), and Obesity.  Chief Complaint: Patient presents for hospital follow-up for left leg cellulitis.  Hospital Course: Venous imaging was negative for DVT. Patient is still completing the prescribed course of antibiotics.  Current Symptoms: Today, the patient reports ongoing left leg pain. There was no inciting injury. Pain is localized to the left leg and described as burning and tingling, particularly with walking and standing. The pain has been fluctuating in intensity since onset. Associated symptoms include difficulty bearing weight, muscle weakness, and tingling sensations. No foreign bodies have been noted. Symptoms are worsened by movement, palpation, and weight-bearing activities. The patient has attempted non-weight bearing and rest, which provided moderate relief.    Review of Systems  Constitutional:  Negative for chills and fever.  Eyes:  Negative for blurred vision.  Respiratory:  Negative for shortness of breath.   Cardiovascular:  Positive for claudication and leg swelling. Negative for chest pain.  Neurological:  Positive for tingling. Negative for dizziness and headaches.      Objective:     BP 123/84   Pulse 79   Ht 5\' 11"  (1.803 m)   Wt (!) 319 lb 1.3 oz (144.7 kg)   SpO2 95%   BMI 44.50 kg/m  BP Readings from Last 3 Encounters:  05/06/23 123/84  05/02/23 (!) 119/49  04/28/23 118/75      Physical Exam Vitals reviewed.  Constitutional:      General: He is not in acute distress.    Appearance: Normal appearance. He is  not ill-appearing, toxic-appearing or diaphoretic.  HENT:     Head: Normocephalic.  Eyes:     General:        Right eye: No discharge.        Left eye: No discharge.     Conjunctiva/sclera: Conjunctivae normal.  Cardiovascular:     Rate and Rhythm: Normal rate.     Pulses: Normal pulses.     Heart sounds: Normal heart sounds.  Pulmonary:     Effort: Pulmonary effort is normal. No respiratory distress.     Breath sounds: Normal breath sounds.  Abdominal:     General: Bowel sounds are normal.     Palpations: Abdomen is soft.     Tenderness: There is no abdominal tenderness. There is no right CVA tenderness, left CVA tenderness or guarding.  Musculoskeletal:        General: Normal range of motion.     Right lower leg: No edema.     Left lower leg: Edema present.  Skin:    General: Skin is warm and dry.     Capillary Refill: Capillary refill takes less than 2 seconds.  Neurological:     Mental Status: He is alert.     Coordination: Coordination normal.     Gait: Gait normal.  Psychiatric:        Mood and Affect: Mood normal.        Behavior: Behavior normal.      No results found for any visits on 05/06/23.  The ASCVD Risk score (Arnett DK,  et al., 2019) failed to calculate for the following reasons:   The 2019 ASCVD risk score is only valid for ages 80 to 12    Assessment & Plan:  Encounter for screening for cardiovascular disorders -     BMP8+eGFR -     CBC with Differential/Platelet  Left leg pain -     Ambulatory referral to Vascular Surgery  Hospital discharge follow-up Assessment & Plan: Negative Homan's sign BMP and CBC labs ordered. The hospital chart, including the discharge summary, was thoroughly reviewed Medications were thoroughly reviewed and reconciled with the patient.  Referral placed to Vascular for further evaluation    Other orders -     Allopurinol; Take 1 tablet (100 mg total) by mouth daily.  Dispense: 30 tablet; Refill: 0 -      Triamterene-HCTZ; Take 1 tablet by mouth daily.  Dispense: 90 tablet; Refill: 0    Return if symptoms worsen or fail to improve.   Avelino Lek Amber Bail, FNP

## 2023-05-06 NOTE — Patient Instructions (Signed)

## 2023-05-06 NOTE — Assessment & Plan Note (Addendum)
 Negative Homan's sign BMP and CBC labs ordered. The hospital chart, including the discharge summary, was thoroughly reviewed Medications were thoroughly reviewed and reconciled with the patient.  Referral placed to Vascular for further evaluation

## 2023-05-17 ENCOUNTER — Telehealth: Payer: Self-pay

## 2023-05-17 NOTE — Telephone Encounter (Signed)
 Copied from CRM [id number]. Topic: Clinical - Medical Advice >> May 17, 2023  9:41 AM Albertha Alosa wrote: Reason for CRM: Patient called in stating he is still having some trouble with his legs, would like for Dr.Simpsons nurse to give him a callback regarding this   [phone]

## 2023-05-18 ENCOUNTER — Telehealth: Payer: Self-pay

## 2023-05-18 ENCOUNTER — Ambulatory Visit (INDEPENDENT_AMBULATORY_CARE_PROVIDER_SITE_OTHER): Admitting: Internal Medicine

## 2023-05-18 ENCOUNTER — Ambulatory Visit: Payer: Self-pay

## 2023-05-18 ENCOUNTER — Encounter: Payer: Self-pay | Admitting: Internal Medicine

## 2023-05-18 VITALS — BP 147/88 | HR 93 | Ht 71.0 in | Wt 325.2 lb

## 2023-05-18 DIAGNOSIS — M7989 Other specified soft tissue disorders: Secondary | ICD-10-CM

## 2023-05-18 NOTE — Patient Instructions (Signed)
 It was a pleasure to see you today.  Thank you for giving us  the opportunity to be involved in your care.  Below is a brief recap of your visit and next steps.  We will plan to see you again on 06/30/23.  Summary I recommend getting an above knee compression stocking for the left leg Elevated your leg during the day as much as possible Return to care if swelling persists and does resolve overnight, it becomes painful, or you develop additional symptoms such as difficulty breathing.

## 2023-05-18 NOTE — Telephone Encounter (Signed)
 See triage note. Pt scheduled appt for eval

## 2023-05-18 NOTE — Progress Notes (Signed)
 Acute Office Visit  Subjective:     Patient ID: Danny Gomez, male    DOB: 05-Jun-1985, 38 y.o.   MRN: [id number]  Chief Complaint  Patient presents with   Leg Swelling    Swelling in left leg    Mr. Danny Gomez presents today for an acute visit in the setting of persistent swelling of the left lower extremity.  Recent hospital admission 4/10 - 4/13 in the setting of cellulitis.  Vascular ultrasound 4/9 was negative for DVT.  He completed antibiotics and was seen at [facility] for hospital follow-up on 4/17.  Left lower extremity swelling documented on exam during that encounter.  Today he endorses persistent, intermittent swelling of the left leg.  He states that pain and erythema have resolved but swelling continues to occur intermittently.  It typically resolves overnight and he states that his left leg looks the same as his right leg when he wakes up in the morning.  He works as a Paediatric nurse and states that swelling gradually increases throughout the day.  He is wearing a compression stocking and would like to discuss additional treatment options.  He denies pain in the left leg and calf, fever, fatigue, chest pain, and shortness of breath.  Review of Systems  Constitutional:  Negative for chills and fever.  HENT:  Negative for sore throat.   Respiratory:  Negative for cough and shortness of breath.   Cardiovascular:  Positive for leg swelling (Left leg). Negative for chest pain and palpitations.  Gastrointestinal:  Negative for abdominal pain, blood in stool, constipation, diarrhea, nausea and vomiting.  Genitourinary:  Negative for dysuria and hematuria.  Musculoskeletal:  Negative for myalgias.  Skin:  Negative for itching and rash.  Neurological:  Negative for dizziness and headaches.  Psychiatric/Behavioral:  Negative for depression and suicidal ideas.       Objective:    BP (!) 147/88   Pulse 93   Ht 5\' 11"  (1.803 m)   Wt (!) 325 lb 3.2 oz (147.5 kg)   SpO2 94%   BMI 45.36 kg/m    Physical Exam Vitals reviewed.  Constitutional:      General: He is not in acute distress.    Appearance: Normal appearance. He is obese. He is not ill-appearing.  HENT:     Head: Normocephalic and atraumatic.     Right Ear: External ear normal.     Left Ear: External ear normal.     Nose: Nose normal. No congestion or rhinorrhea.     Mouth/Throat:     Mouth: Mucous membranes are moist.     Pharynx: Oropharynx is clear.  Eyes:     General: No scleral icterus.    Extraocular Movements: Extraocular movements intact.     Conjunctiva/sclera: Conjunctivae normal.     Pupils: Pupils are equal, round, and reactive to light.  Cardiovascular:     Rate and Rhythm: Normal rate and regular rhythm.     Pulses: Normal pulses.     Heart sounds: Normal heart sounds. No murmur heard. Pulmonary:     Effort: Pulmonary effort is normal.     Breath sounds: Normal breath sounds. No wheezing, rhonchi or rales.  Abdominal:     General: Abdomen is flat. Bowel sounds are normal. There is no distension.     Palpations: Abdomen is soft.     Tenderness: There is no abdominal tenderness.  Musculoskeletal:        General: Swelling (Nonpitting edema is present over the left lower  leg and ankle.  There is no overlying erythema.  No tenderness to palpation.  Negative Homans sign of the left leg.) present. No tenderness or deformity. Normal range of motion.     Cervical back: Normal range of motion.  Skin:    General: Skin is warm and dry.     Capillary Refill: Capillary refill takes less than 2 seconds.  Neurological:     General: No focal deficit present.     Mental Status: He is alert and oriented to person, place, and time.     Motor: No weakness.  Psychiatric:        Mood and Affect: Mood normal.        Behavior: Behavior normal.        Thought Content: Thought content normal.       Assessment & Plan:   Problem List Items Addressed This Visit       Left leg swelling - Primary   Presenting  today for an acute visit endorsing persistent, intermittent left lower extremity swelling in setting of recent hospital admission for cellulitis of the left leg.  He has completed antibiotics.  Seen at [facility] for hospital follow-up on 4/17.  Left lower extremity edema documented at that time.  Overall he states that edema is improving.  It is intermittent in nature and resolves overnight with leg elevation.  Nonpitting left lower extremity edema is present on exam today.  It is not tender to palpation and there is no overlying erythema.  I have low suspicion for DVT given the intermittent nature of edema, absence of tenderness, and previous negative study.  I also have low suspicion for recurrent cellulitis in the absence of erythema.  I suspect his symptoms are attributable to venous insufficiency in the setting of recent cellulitis.  Conservative treatment measures were reviewed.  I recommended that he purchase a compression stocking that extends proximally above the knee.  He was encouraged to elevate his leg as able.  He will return to care if symptoms worsen or fail to improve.  Otherwise, PCP follow-up as scheduled for exam.       Return if symptoms worsen or fail to improve.  Tobi Fortes, MD

## 2023-05-18 NOTE — Telephone Encounter (Signed)
 Summary: Swelling in legs   Copied From CRM [id number]. Reason for Triage: Pt's mother called requesting a call back for the patient regarding his symptoms.  "Swelling in legs, seeking advice"  Best contact: [phone]  Chief Complaint: Left ankle/calf swelling Symptoms: Swelling Frequency: On and off since ED visit this month Pertinent Negatives: Patient denies redness Disposition: [] ED /[] Urgent Care (no appt availability in office) / [x] Appointment(In office/virtual)/ []  [facility]/ [] Home Care/ [] Refused Recommended Disposition /[] [facility]/ []  Follow-up with PCP Additional Notes: Patient called in to report swelling of his left calf/ankle. Patient stated swelling is only present on left side. Patient was seen in ED on 04/28/23 for cellulitis of this leg. Patient denied fever, pain, redness, difficulty breathing and chest pain. Advised patient to be seen within 4 hours, per protocol. No availability with PCP. Scheduled with alternate provider in office. Provided care advice and instructed patient to call back if symptoms worsen. Patient complied.   Reason for Disposition  [1] Thigh, calf, or ankle swelling AND [2] only 1 side  Answer Assessment - Initial Assessment Questions 1. ONSET: "When did the swelling start?" (e.g., minutes, hours, days)     On and off this month 2. LOCATION: "What part of the leg is swollen?"  "Are both legs swollen or just one leg?"     Left leg 3. SEVERITY: "How bad is the swelling?" (e.g., localized; mild, moderate, severe)   - Localized: Small area of swelling localized to one leg.   - MILD pedal edema: Swelling limited to foot and ankle, pitting edema < 1/4 inch (6 mm) deep, rest and elevation eliminate most or all swelling.   - MODERATE edema: Swelling of lower leg to knee, pitting edema > 1/4 inch (6 mm) deep, rest and elevation only partially reduce swelling.   - SEVERE edema: Swelling extends above knee, facial or hand swelling  present.      Mild/moderate- ankle to calf, swelling increases throughout the day, swelling decreased when he is off his feet or elevating his legs 4. REDNESS: "Does the swelling look red or infected?"     Denies 5. PAIN: "Is the swelling painful to touch?" If Yes, ask: "How painful is it?"   (Scale 1-10; mild, moderate or severe)     Denies 6. FEVER: "Do you have a fever?" If Yes, ask: "What is it, how was it measured, and when did it start?"      Denies 7. CAUSE: "What do you think is causing the leg swelling?"     Treated for cellulitis earlier this month 8. MEDICAL HISTORY: "Do you have a history of blood clots (e.g., DVT), cancer, heart failure, kidney disease, or liver failure?"     Yes, states he was checked the last time his leg swelled up 9. RECURRENT SYMPTOM: "Have you had leg swelling before?" If Yes, ask: "When was the last time?" "What happened that time?"     Denies 10. OTHER SYMPTOMS: "Do you have any other symptoms?" (e.g., chest pain, difficulty breathing)     Denies chest pain, denies difficulty breathing, able to walk normally  Protocols used: Leg Swelling and Edema-A-AH

## 2023-05-18 NOTE — Assessment & Plan Note (Addendum)
 Presenting today for an acute visit endorsing persistent, intermittent left lower extremity swelling in setting of recent hospital admission for cellulitis of the left leg.  He has completed antibiotics.  Seen at [facility] for hospital follow-up on 4/17.  Left lower extremity edema documented at that time.  Overall he states that edema is improving.  It is intermittent in nature and resolves overnight with leg elevation.  Nonpitting left lower extremity edema is present on exam today.  It is not tender to palpation and there is no overlying erythema.  I have low suspicion for DVT given the intermittent nature of edema, absence of tenderness, and previous negative study.  I also have low suspicion for recurrent cellulitis in the absence of erythema.  I suspect his symptoms are attributable to venous insufficiency in the setting of recent cellulitis.  Conservative treatment measures were reviewed.  I recommended that he purchase a compression stocking that extends proximally above the knee.  He was encouraged to elevate his leg as able.  He will return to care if symptoms worsen or fail to improve.  Otherwise, PCP follow-up as scheduled for exam.

## 2023-05-18 NOTE — Telephone Encounter (Signed)
 Pt was scheduled with available provider to discuss issue he was calling about

## 2023-05-18 NOTE — Telephone Encounter (Signed)
 Copied from CRM [id number]. Topic: General - Call Back - No Documentation >> May 18, 2023  8:16 AM Danny Gomez wrote: Reason for CRM: Patient is calling to state that he called on yesterday to speak with one of the Dr's nurses and was told he would get a call back did not rcv one.

## 2023-05-19 ENCOUNTER — Other Ambulatory Visit: Payer: Self-pay

## 2023-05-19 ENCOUNTER — Other Ambulatory Visit: Payer: Self-pay | Admitting: Family Medicine

## 2023-06-08 ENCOUNTER — Other Ambulatory Visit: Payer: Self-pay

## 2023-06-08 DIAGNOSIS — I872 Venous insufficiency (chronic) (peripheral): Secondary | ICD-10-CM

## 2023-06-18 ENCOUNTER — Ambulatory Visit (HOSPITAL_COMMUNITY)
Admission: RE | Admit: 2023-06-18 | Discharge: 2023-06-18 | Disposition: A | Discharge status: Home / Self Care | Source: Ambulatory Visit | Attending: Vascular Surgery | Admitting: Vascular Surgery

## 2023-06-18 DIAGNOSIS — I872 Venous insufficiency (chronic) (peripheral): Secondary | ICD-10-CM | POA: Diagnosis present

## 2023-06-26 ENCOUNTER — Ambulatory Visit
Admission: EM | Admit: 2023-06-26 | Discharge: 2023-06-26 | Disposition: A | Discharge status: Home / Self Care | Attending: Internal Medicine | Admitting: Internal Medicine

## 2023-06-26 DIAGNOSIS — N489 Disorder of penis, unspecified: Secondary | ICD-10-CM | POA: Insufficient documentation

## 2023-06-26 MED ORDER — VALACYCLOVIR HCL 1 G PO TABS
1000.0000 mg | ORAL_TABLET | Freq: Two times a day (BID) | ORAL | 0 refills | Status: DC
Start: 2023-06-26 — End: 2023-07-09

## 2023-06-26 NOTE — ED Triage Notes (Signed)
 Pt state genital rash that started a few weeks ago and went away and now it is back.

## 2023-06-26 NOTE — ED Provider Notes (Signed)
 [facility]    CSN: [id number] Arrival date & time: 06/26/23  0941      History   Chief Complaint Chief Complaint  Patient presents with   Rash    HPI Danny Gomez is a 38 y.o. male.   Danny Gomez is a 38 y.o. male presenting for chief complaint of Rash to the penis that started approximately 2 weeks ago, improved, and has now returned. When rash first appeared, rash consisted one singular lesion to the tip of the penis. Lesion did not drain any fluid and felt like a "cut" to the penis. Pain worsened with urination and "stretching" of the penis tissue. Lesion improved and has now returned a few days ago. He additionally reports multiple similar lesions to the shaft of the penis. Lesions have scant white appearing drainage to the center and feel like cuts. They are mildly painful. Denies itching and irritation to the penis. No recent fevers, chills, urinary symptoms, nausea, vomiting, abdominal pain, or history of HSV-1/2. No penile discharge or concern for STD today. He is sexually active with condom for protection.    Rash   Past Medical History:  Diagnosis Date   Asthma    pt. reports as a Danny he used an inhaler , but asthma has resoled in the adult path   Febrile illness    Headache(784.0)    Hypertension    Neuromuscular disorder (HCC)    hnp- L4-5, L5-S1   Obesity     Patient Active Problem List   Diagnosis Date Noted   Hospital discharge follow-up 05/06/2023   Hypokalemia 04/30/2023   Hyponatremia 04/30/2023   Asthma, chronic 04/30/2023   Gout 04/30/2023   Neuropathy 04/30/2023   Left leg swelling 04/28/2023   History of DVT (deep vein thrombosis) 04/28/2023   Acute gastroenteritis 04/28/2023   Cellulitis of left lower extremity 04/28/2023   Depression, major, single episode, moderate (HCC) 03/30/2021   Generalized anxiety disorder 03/25/2021   Bloating symptom 07/15/2020   Nausea 07/15/2020   Hydradenitis 05/13/2020   COVID-19  01/23/2020   Neck pain on left side 12/08/2019   Left leg DVT (HCC) 02/05/2018   Back pain with right-sided sciatica 01/24/2017   IGT (impaired glucose tolerance) 02/04/2015   Vitamin D  deficiency 02/06/2013   Allergic rhinitis 10/29/2009   Morbid obesity (HCC) 08/26/2007   Essential hypertension 08/26/2007    Past Surgical History:  Procedure Laterality Date   right ankle surgery  2002   bones had fused but it was corrected surgically   TONSILLECTOMY         Home Medications    Prior to Admission medications   Medication Sig Start Date End Date Taking? Authorizing Provider  valACYclovir (VALTREX) 1000 MG tablet Take 1 tablet (1,000 mg total) by mouth 2 (two) times daily for 7 days. 06/26/23 07/03/23 Yes StanhopeDanny Dye, FNP  acetaminophen  (TYLENOL ) 650 MG CR tablet Take 1,300 mg by mouth every 8 (eight) hours as needed for pain.    [provider]  allopurinol  (ZYLOPRIM ) 100 MG tablet Take 1 tablet (100 mg total) by mouth daily. 05/06/23   Del Orbe Polanco, Iliana, FNP  amLODipine  (NORVASC ) 10 MG tablet Take 1 tablet by mouth once daily 05/19/23   Towanda Fret, MD  citalopram  (CELEXA ) 10 MG tablet Take 1 tablet (10 mg total) by mouth daily. Patient not taking: Reported on 05/06/2023 03/25/21   Towanda Fret, MD  fluticasone  (FLONASE ) 50 MCG/ACT nasal spray Place 2 sprays  into both nostrils daily. 04/01/23   Hedges, Susana Enter, PA-C  ondansetron  (ZOFRAN -ODT) 4 MG disintegrating tablet Take 1 tablet (4 mg total) by mouth every 8 (eight) hours as needed for nausea or vomiting. Patient not taking: Reported on 05/06/2023 04/27/23   Rexie Catena, PA-C  oxyCODONE  (ROXICODONE ) 5 MG immediate release tablet Take 1 tablet (5 mg total) by mouth every 4 (four) hours as needed for severe pain (pain score 7-10). 04/28/23   Lucina Sabal, PA-C  potassium chloride  SA (KLOR-CON  M) 20 MEQ tablet Take 2 tablets (40 mEq total) by mouth once for 1 dose. 04/28/23 05/06/23  Lucina Sabal, PA-C   triamterene -hydrochlorothiazide  (MAXZIDE-25) 37.5-25 MG tablet Take 1 tablet by mouth daily. 05/06/23   Del Abron Abt, FNP    Family History Family History  Problem Relation Age of Onset   Hypertension Mother    Obesity Mother     Social History Social History   Tobacco Use   Smoking status: Never   Smokeless tobacco: Never  Vaping Use   Vaping status: Never Used  Substance Use Topics   Alcohol use: Yes    Comment: socially- monthly    Drug use: Yes    Types: Marijuana    Comment: once per day      Allergies   Lisinopril    Review of Systems Review of Systems  Skin:  Positive for rash.  Per HPI   Physical Exam Triage Vital Signs ED Triage Vitals  Encounter Vitals Group     BP 06/26/23 0951 (!) 153/87     Systolic BP Percentile --      Diastolic BP Percentile --      Pulse Rate 06/26/23 0951 (!) 105     Resp 06/26/23 0951 16     Temp 06/26/23 0951 (!) 97.5 F (36.4 C)     Temp Source 06/26/23 0951 Oral     SpO2 06/26/23 0951 96 %     Weight --      Height --      Head Circumference --      Peak Flow --      Pain Score 06/26/23 0950 0     Pain Loc --      Pain Education --      Exclude from Growth Chart --    No data found.  Updated Vital Signs BP (!) 153/87 (BP Location: Right Arm)   Pulse (!) 105   Temp (!) 97.5 F (36.4 C) (Oral)   Resp 16   SpO2 96%   Visual Acuity Right Eye Distance:   Left Eye Distance:   Bilateral Distance:    Right Eye Near:   Left Eye Near:    Bilateral Near:     Physical Exam Vitals and nursing note reviewed. Exam conducted with a chaperone present Katheran Palms, RN present for GU assessment).  Constitutional:      Appearance: He is not ill-appearing or toxic-appearing.  HENT:     Head: Normocephalic and atraumatic.     Right Ear: Hearing and external ear normal.     Left Ear: Hearing and external ear normal.     Nose: Nose normal.     Mouth/Throat:     Lips: Pink.  Eyes:     General: Lids are  normal. Vision grossly intact. Gaze aligned appropriately.     Extraocular Movements: Extraocular movements intact.     Conjunctiva/sclera: Conjunctivae normal.  Pulmonary:     Effort: Pulmonary effort is normal.  Genitourinary:  Penis: Erythema, tenderness and lesions present. No phimosis, paraphimosis or hypospadias.      Testes: Normal.     Comments: 6-7 erythematous vesicular appearing lesions to the shaft of the penis with central purulence and tenderness. 1-2 similar appearing lesions to the glans penis. No chancre appearing lesions. No penile discharge.  Musculoskeletal:     Cervical back: Neck supple.  Skin:    General: Skin is warm and dry.     Capillary Refill: Capillary refill takes less than 2 seconds.     Findings: No rash.  Neurological:     General: No focal deficit present.     Mental Status: He is alert and oriented to person, place, and time. Mental status is at baseline.     Cranial Nerves: No dysarthria or facial asymmetry.  Psychiatric:        Mood and Affect: Mood normal.        Speech: Speech normal.        Behavior: Behavior normal.        Thought Content: Thought content normal.        Judgment: Judgment normal.      UC Treatments / Results  Labs (all labs ordered are listed, but only abnormal results are displayed) Labs Reviewed  HSV CULTURE AND TYPING    EKG   Radiology No results found.  Procedures Procedures (including critical care time)  Medications Ordered in UC Medications - No data to display  Initial Impression / Assessment and Plan / UC Course  I have reviewed the triage vital signs and the nursing notes.  Pertinent labs & imaging results that were available during my care of the patient were reviewed by me and considered in my medical decision making (see chart for details).   1. Penile lesion Rash is highly suspicious for new onset herpes simplex virus.  HSV culture and typing is pending. Empiric treatment with antiviral  ordered- Valtrex 1g BID for 7 days. May use OTC medications as needed for pain.  No current signs of secondary bacterial infection, infection return precautions discussed. Advised to notify sexual partner(s) of result if HSV culture and typing comes back positive, no intercourse during active outbreaks. Advised to review further education provided in AVS.   Counseled patient on potential for adverse effects with medications prescribed/recommended today, strict ER and return-to-clinic precautions discussed, patient verbalized understanding.    Final Clinical Impressions(s) / UC Diagnoses   Final diagnoses:  Penile lesion   Discharge Instructions      Your rash is likely due to herpes simplex virus infection. Take the antiviral as prescribed. The swab to test for the virus (HSV) is pending. You'll be able to see your results in MyChart and staff will call you if the test is positive.  If the test is positive, notify sexual partner(s) of results. Do not participate in intercourse until the rash has healed as this increases chance of spread to partner.  Review further information/education on HSV in your discharge packet.  Return to urgent care if you notice pus drainage from the bumps and/or new/worsening pain or if you develop fever, chills, nausea, vomiting, body aches at home.    ED Prescriptions     Medication Sig Dispense Auth. Provider   valACYclovir (VALTREX) 1000 MG tablet Take 1 tablet (1,000 mg total) by mouth 2 (two) times daily for 7 days. 14 tablet Starlene Eaton, FNP      PDMP not reviewed this encounter.   Starlene Eaton,  FNP 06/26/23 1211

## 2023-06-26 NOTE — Discharge Instructions (Addendum)
 Your rash is likely due to herpes simplex virus infection. Take the antiviral as prescribed. The swab to test for the virus (HSV) is pending. You'll be able to see your results in MyChart and staff will call you if the test is positive.  If the test is positive, notify sexual partner(s) of results. Do not participate in intercourse until the rash has healed as this increases chance of spread to partner.  Review further information/education on HSV in your discharge packet.  Return to urgent care if you notice pus drainage from the bumps and/or new/worsening pain or if you develop fever, chills, nausea, vomiting, body aches at home.

## 2023-06-30 ENCOUNTER — Ambulatory Visit: Admitting: Family Medicine

## 2023-06-30 ENCOUNTER — Encounter: Payer: Self-pay | Admitting: Family Medicine

## 2023-06-30 ENCOUNTER — Ambulatory Visit (HOSPITAL_COMMUNITY): Payer: Self-pay

## 2023-06-30 VITALS — BP 130/82 | HR 84 | Resp 16 | Ht 71.0 in | Wt 318.4 lb

## 2023-06-30 DIAGNOSIS — Z13220 Encounter for screening for lipoid disorders: Secondary | ICD-10-CM

## 2023-06-30 DIAGNOSIS — F902 Attention-deficit hyperactivity disorder, combined type: Secondary | ICD-10-CM

## 2023-06-30 DIAGNOSIS — R7302 Impaired glucose tolerance (oral): Secondary | ICD-10-CM

## 2023-06-30 DIAGNOSIS — B009 Herpesviral infection, unspecified: Secondary | ICD-10-CM

## 2023-06-30 DIAGNOSIS — Z7189 Other specified counseling: Secondary | ICD-10-CM

## 2023-06-30 DIAGNOSIS — I10 Essential (primary) hypertension: Secondary | ICD-10-CM

## 2023-06-30 DIAGNOSIS — L03116 Cellulitis of left lower limb: Secondary | ICD-10-CM

## 2023-06-30 DIAGNOSIS — M109 Gout, unspecified: Secondary | ICD-10-CM

## 2023-06-30 DIAGNOSIS — Z2981 Encounter for HIV pre-exposure prophylaxis: Secondary | ICD-10-CM

## 2023-06-30 DIAGNOSIS — E559 Vitamin D deficiency, unspecified: Secondary | ICD-10-CM

## 2023-06-30 LAB — HSV CULTURE AND TYPING

## 2023-06-30 MED ORDER — AMPHETAMINE-DEXTROAMPHET ER 20 MG PO CP24: 20.0000 mg | ORAL_CAPSULE | ORAL | 0 refills | Status: DC

## 2023-06-30 MED ORDER — TRIAMTERENE-HCTZ 37.5-25 MG PO TABS: 1.0000 | ORAL_TABLET | Freq: Every day | ORAL | 3 refills | Status: AC

## 2023-06-30 MED ORDER — ALLOPURINOL 100 MG PO TABS: 100.0000 mg | ORAL_TABLET | Freq: Every day | ORAL | 1 refills | Status: DC

## 2023-06-30 MED ORDER — AMLODIPINE BESYLATE 10 MG PO TABS: 10.0000 mg | ORAL_TABLET | Freq: Every day | ORAL | 3 refills | Status: AC

## 2023-06-30 NOTE — Patient Instructions (Signed)
 Annual exam min 7 weeks , call if you need me sooner  New medication for ADHD is prescribed  You are referred to clinic as discussed  Labs today, HBA1C, uric acid , Bmp and EGFr, TSH , lipid panel (nurse pls order)  It is important that you exercise regularly at least 30 minutes 5 times a week. If you develop chest pain, have severe difficulty breathing, or feel very tired, stop exercising immediately and seek medical attention   Think about what you will eat, plan ahead. Choose  clean, green, fresh or frozen over canned, processed or packaged foods which are more sugary, salty and fatty. 70 to 75% of food eaten should be vegetables and fruit. Three meals at set times with snacks allowed between meals, but they must be fruit or vegetables. Aim to eat over a 12 hour period , example 7 am to 7 pm, and STOP after  your last meal of the day. Drink water,generally about 64 ounces per day, no other drink is as healthy. Fruit juice is best enjoyed in a healthy way, by EATING the fruit.  Thanks for choosing [facility], we consider it a privelige to serve you.

## 2023-07-01 ENCOUNTER — Ambulatory Visit: Payer: Self-pay | Admitting: Family Medicine

## 2023-07-01 LAB — BMP8+EGFR
BUN/Creatinine Ratio: 13 (ref 9–20)
BUN: 15 mg/dL (ref 6–20)
CO2: 23 mmol/L (ref 20–29)
Calcium: 10.2 mg/dL (ref 8.7–10.2)
Chloride: 100 mmol/L (ref 96–106)
Creatinine, Ser: 1.16 mg/dL (ref 0.76–1.27)
Glucose: 79 mg/dL (ref 70–99)
Potassium: 3.9 mmol/L (ref 3.5–5.2)
Sodium: 140 mmol/L (ref 134–144)
eGFR: 83 mL/min/{1.73_m2} (ref >59)

## 2023-07-01 LAB — HEMOGLOBIN A1C
Est. average glucose Bld gHb Est-mCnc: 103 mg/dL
Hgb A1c MFr Bld: 5.2 % (ref 4.8–5.6)

## 2023-07-01 LAB — LIPID PANEL
Chol/HDL Ratio: 5.1 ratio — ABNORMAL HIGH (ref 0.0–5.0)
Cholesterol, Total: 154 mg/dL (ref 100–199)
HDL: 30 mg/dL — ABNORMAL LOW (ref >39)
LDL Chol Calc (NIH): 97 mg/dL (ref 0–99)
Triglycerides: 152 mg/dL — ABNORMAL HIGH (ref 0–149)
VLDL Cholesterol Cal: 27 mg/dL (ref 5–40)

## 2023-07-01 LAB — URIC ACID: Uric Acid: 10.8 mg/dL — ABNORMAL HIGH (ref 3.8–8.4)

## 2023-07-01 LAB — TSH: TSH: 1.04 u[IU]/mL (ref 0.450–4.500)

## 2023-07-01 MED ORDER — ALLOPURINOL 300 MG PO TABS: 300.0000 mg | ORAL_TABLET | Freq: Every day | ORAL | 6 refills | Status: DC

## 2023-07-09 MED ORDER — VALACYCLOVIR HCL 1 G PO TABS: 1000.0000 mg | ORAL_TABLET | Freq: Two times a day (BID) | ORAL | 0 refills | Status: AC

## 2023-07-09 NOTE — Progress Notes (Unsigned)
 [facility]           OF   History and Physical   Danny Gomez is a 38 y.o. male who presents with LLE swelling.  He was hospitalized in April 2025 for LLE swelling in setting of cellulitis.  His DVT study was negative for DVT.  When he was seen by his PCP in late April, he was still having swelling but the pain and redness had improved.  The swelling would get better after being in bed overnight.  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  He was wearing compression.    He comes in today for further evaluation.  He states that about 2-3 years ago, he had a flare of gout in the left great toe and was off he feet for a bit of time and developed a blood clot in the left leg.  He was on anticoagulation for several months and then stopped.  He states he also had an injury in high school of the left leg but was told he had bruising and no other injuries.  He did have some numbness in the calf that resolved after about a year.  His grandmother has hx of varicose veins.  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  He does wear knee high compression.  He states that the swelling in the left leg worsens through the day when he is on his feet and improves after he has been in bed.  He states he had an ear infection in March and did not finish his abx and ended up with infection in the left leg that did improve with abx.  He does not have skin color changes.  He has hx of surgery on the right ankle.   The pt is not on a statin for cholesterol management.  The pt is not on a daily aspirin.   Other AC:  none The pt is on CCB, diuretic for hypertension.   The pt is not on medication for diabetes.   Tobacco hx:  never  Pt does not have family hx of AAA.  Past Medical History:  Diagnosis Date   Asthma    pt. reports as a child he used an inhaler , but asthma has resoled in the adult path    Febrile illness    Headache(784.0)    Hypertension    Neuromuscular disorder (HCC)    hnp- L4-5, L5-S1   Obesity     Past Surgical History:  Procedure Laterality Date   right ankle surgery  2002   bones had fused but it was corrected surgically   TONSILLECTOMY      Social History   Socioeconomic History   Marital status: Single    Spouse name: Not on file   Number of children: 0   Years of education: College gr   Highest education level: Not on file  Occupational History   Occupation: Employed   Tobacco Use   Smoking status: Never   Smokeless tobacco: Never  Vaping Use   Vaping status: Never Used  Substance and Sexual Activity   Alcohol use: Yes    Comment: socially- monthly    Drug use: Yes    Types: Marijuana    Comment: once per day    Sexual activity: Not Currently  Other Topics Concern   Not on file  Social History Narrative   Living with Grandparents    Social Drivers of Health   Financial Resource Strain: Not on file  Food Insecurity: No Food Insecurity (04/30/2023)   Hunger Vital Sign    Worried About Running Out of Food in the Last Year: Never true    Ran Out of Food in the Last Year: Never true  Transportation Needs: No Transportation Needs (04/30/2023)   PRAPARE - Administrator, Civil Service (Medical): No    Lack of Transportation (Non-Medical): No  Physical Activity: Not on file  Stress: Not on file  Social Connections: Unknown (04/30/2023)   Social Connection and Isolation Panel    Frequency of Communication with Friends and Family: Not on file    Frequency of Social Gatherings with Friends and Family: Not on file    Attends Religious Services: Not on file    Active Member of Clubs or Organizations: Not on file    Attends Banker Meetings: Not on file    Marital Status: Never married  Intimate Partner Violence: Not At Risk (04/30/2023)   Humiliation, Afraid, Rape, and Kick questionnaire    Fear of Current or  Ex-Partner: No    Emotionally Abused: No    Physically Abused: No    Sexually Abused: No     Family History  Problem Relation Age of Onset   Hypertension Mother    Obesity Mother     Current Outpatient Medications  Medication Sig Dispense Refill   acetaminophen  (TYLENOL ) 650 MG CR tablet Take 1,300 mg by mouth every 8 (eight) hours as needed for pain.     allopurinol  (ZYLOPRIM ) 300 MG tablet Take 1 tablet (300 mg total) by mouth daily. 30 tablet 6   amLODipine  (NORVASC ) 10 MG tablet Take 1 tablet (10 mg total) by mouth daily. 90 tablet 3   amphetamine -dextroamphetamine (ADDERALL XR) 20 MG 24 hr capsule Take 1 capsule (20 mg total) by mouth every morning. 30 capsule 0   fluticasone  (FLONASE ) 50 MCG/ACT nasal spray Place 2 sprays into both nostrils daily. 16 g 6   triamterene -hydrochlorothiazide  (MAXZIDE-25) 37.5-25 MG tablet Take 1 tablet by mouth daily. 90 tablet 3   valACYclovir  (VALTREX ) 1000 MG tablet Take 1 tablet (1,000 mg total) by mouth 2 (two) times daily for 7 days. 14 tablet 0   No current facility-administered medications for this visit.    Allergies  Allergen Reactions   Lisinopril  Cough    REVIEW OF SYSTEMS:   [X]  denotes positive finding, [ ]  denotes negative finding Cardiac  Comments:  Chest pain or chest pressure:    Shortness of breath upon exertion:    Short of breath when lying flat:    Irregular heart rhythm:        Vascular    Pain in calf, thigh, or hip brought on by ambulation:    Pain in feet at night that wakes you up from your sleep:     Blood clot in your veins:    Leg swelling:  x       Pulmonary    Oxygen at home:    Productive cough:     Wheezing:         Neurologic    Sudden weakness in arms or legs:     Sudden numbness in arms or legs:     Sudden onset of difficulty speaking or slurred speech:    Temporary loss of vision in one eye:     Problems with  dizziness:         Gastrointestinal    Blood in stool:     Vomited blood:          Genitourinary    Burning when urinating:     Blood in urine:        Psychiatric    Major depression:         Hematologic    Bleeding problems:    Problems with blood clotting too easily:        Skin    Rashes or ulcers:        Constitutional    Fever or chills:      PHYSICAL EXAMINATION:  Today's Vitals   07/12/23 0855  BP: 132/83  Pulse: 78  Temp: 98.8 F (37.1 C)  TempSrc: Temporal  SpO2: 95%  Weight: (!) 320 lb 3.2 oz (145.2 kg)  Height: 5' 11 (1.803 m)  PainSc: 0-No pain   Body mass index is 44.66 kg/m.'   General:  WDWN in NAD; vital signs documented above Gait: Not observed HENT: WNL, normocephalic Pulmonary: normal non-labored breathing without wheezing Cardiac: regular HR; without carotid bruits Abdomen: soft, NT, aortic pulse is not palpable Skin: without rashes Vascular Exam/Pulses:  Right Left  Radial 2+ (normal) 2+ (normal)  DP 2+ (normal) 2+ (normal)   Extremities: mild LLE swelling.  He does have some superficial spider veins on the right leg around the ankle.    Neurologic: A&O X 3;  moving all extremities equally Psychiatric:  The pt has Normal affect.   Non-Invasive Vascular Imaging:   Venous duplex on 06/18/2023: +--------------+---------+------+-----------+------------+--------+  LEFT         Reflux NoRefluxReflux TimeDiameter cmsComments                          Yes                                   +--------------+---------+------+-----------+------------+--------+  CFV          no                                              +--------------+---------+------+-----------+------------+--------+  FV mid        no                                              +--------------+---------+------+-----------+------------+--------+  GSV at SFJ    no                            1.01              +--------------+---------+------+-----------+------------+--------+  GSV prox thighno                             .46               +--------------+---------+------+-----------+------------+--------+  GSV mid thigh no                            .50               +--------------+---------+------+-----------+------------+--------+  GSV dist thighno                            .52               +--------------+---------+------+-----------+------------+--------+  GSV at knee   no                            .38               +--------------+---------+------+-----------+------------+--------+  GSV prox calf no                            .30               +--------------+---------+------+-----------+------------+--------+  GSV mid calf  no                            .30               +--------------+---------+------+-----------+------------+--------+  SSV Pop Fossa no                            .47               +--------------+---------+------+-----------+------------+--------+  SSV prox calf no                            .30               +--------------+---------+------+-----------+------------+--------+   Summary:  Left:  - No evidence of deep vein thrombosis seen in the left lower extremity,  from the common femoral through the popliteal veins.  - No evidence of superficial venous thrombosis in the left lower  extremity.  - There is no evidence of venous reflux seen in the left lower extremity.     Danny Gomez is a 38 y.o. male who presents with: LLE swelling    -pt has easily palpable DP pedal pulses bilaterally -pt does not have evidence of DVT.  Pt does not have venous reflux in the LLE -discussed with pt about wearing knee high 15-20 or 20-30 mmHg compression stockings and pt was measured for these today.    -discussed the importance of leg elevation and how to elevate properly - pt is advised to elevate their legs and a diagram is given to them to demonstrate for pt to lay flat on their back with knees elevated and slightly bent  with their feet higher than their knees, which puts their feet higher than their heart for 15 minutes per day.  -discussed with pt to try to avoid sitting or standing for long periods of time. He does work as a Paediatric nurse so discussed walking around as he is able during the day.   -discussed importance of weight loss and exercise and that water aerobics would also be beneficial.  He does have access to swimming pools at his apartment complex.   -handout with recommendations given -pt will f/u as needed.    Lucie Apt, [facility] [phone]  Clinic MD:  Serene

## 2023-07-12 ENCOUNTER — Ambulatory Visit: Attending: Surgery | Admitting: Physician Assistant

## 2023-07-12 ENCOUNTER — Encounter: Payer: Self-pay | Admitting: Physician Assistant

## 2023-07-12 VITALS — BP 132/83 | HR 78 | Temp 98.8°F | Ht 71.0 in | Wt 320.2 lb

## 2023-07-12 DIAGNOSIS — M7989 Other specified soft tissue disorders: Secondary | ICD-10-CM | POA: Diagnosis not present

## 2023-07-27 ENCOUNTER — Other Ambulatory Visit: Payer: Self-pay

## 2023-07-29 ENCOUNTER — Encounter: Payer: Self-pay | Admitting: Family Medicine

## 2023-07-29 DIAGNOSIS — B009 Herpesviral infection, unspecified: Secondary | ICD-10-CM | POA: Insufficient documentation

## 2023-07-29 DIAGNOSIS — Z13220 Encounter for screening for lipoid disorders: Secondary | ICD-10-CM | POA: Insufficient documentation

## 2023-07-29 DIAGNOSIS — E785 Hyperlipidemia, unspecified: Secondary | ICD-10-CM | POA: Insufficient documentation

## 2023-07-29 DIAGNOSIS — F902 Attention-deficit hyperactivity disorder, combined type: Secondary | ICD-10-CM | POA: Insufficient documentation

## 2023-07-29 DIAGNOSIS — Z7189 Other specified counseling: Secondary | ICD-10-CM | POA: Insufficient documentation

## 2023-07-29 MED ORDER — VALACYCLOVIR HCL 500 MG PO TABS: 500.0000 mg | ORAL_TABLET | Freq: Every day | ORAL | 5 refills | Status: DC

## 2023-07-29 MED ORDER — AMPHETAMINE-DEXTROAMPHET ER 20 MG PO CP24: 20.0000 mg | ORAL_CAPSULE | ORAL | 0 refills | Status: DC

## 2023-07-29 MED ORDER — VALACYCLOVIR HCL 1 G PO TABS: 1000.0000 mg | ORAL_TABLET | Freq: Two times a day (BID) | ORAL | 2 refills | Status: DC

## 2023-07-29 NOTE — Assessment & Plan Note (Signed)
 Discussed with pt and agrees on referral to ID clinic for management and prevention of STD's

## 2023-07-29 NOTE — Assessment & Plan Note (Signed)
Check uric acid level 

## 2023-07-29 NOTE — Assessment & Plan Note (Signed)
 Controlled, no change in medication DASH diet and commitment to daily physical activity for a minimum of 30 minutes discussed and encouraged, as a part of hypertension management. The importance of attaining a healthy weight is also discussed.     07/12/2023    8:55 AM 06/30/2023   10:18 AM 06/30/2023    9:28 AM 06/26/2023    9:51 AM 05/18/2023    1:55 PM 05/06/2023   10:23 AM 05/02/2023    5:11 AM  BP/Weight  Systolic BP 132 130 157 153 147 123 119  Diastolic BP 83 82 81 87 88 84 49  Wt. (Lbs) 320.2  318.4  325.2 319.08   BMI 44.66 kg/m2  44.41 kg/m2  45.36 kg/m2 44.5 kg/m2

## 2023-07-29 NOTE — Assessment & Plan Note (Signed)
  Patient re-educated about  the importance of commitment to a  minimum of 150 minutes of exercise per week as able.  The importance of healthy food choices with portion control discussed, as well as eating regularly and within a 12 hour window most days. The need to choose clean , green food 50 to 75% of the time is discussed, as well as to make water the primary drink and set a goal of 64 ounces water daily.       07/12/2023    8:55 AM 06/30/2023    9:28 AM 05/18/2023    1:55 PM  Weight /BMI  Weight 320 lb 3.2 oz 318 lb 6.4 oz 325 lb 3.2 oz  Height 5' 11 (1.803 m) 5' 11 (1.803 m) 5' 11 (1.803 m)  BMI 44.66 kg/m2 44.41 kg/m2 45.36 kg/m2

## 2023-07-29 NOTE — Assessment & Plan Note (Addendum)
 Valtrex  refilled  Will laso refer to iD clinic for hIV education, prevention/ prophylaxis

## 2023-07-29 NOTE — Progress Notes (Signed)
 Danny Gomez     MRN: [id number]      DOB: [birth year]  Chief Complaint  Patient presents with   Leg Pain    Left leg follow up visit. Pt states he seen Vascular Dr and nothing looked abnormal    HPI Danny Gomez is here for follow up of LLE swelling , [place], and also was in the UC 3 days ago with HSV 2ROS Denies recent fever or chills. Denies sinus pressure, nasal congestion, ear pain or sore throat. Denies chest congestion, productive cough or wheezing. Denies chest pains, palpitations and leg swelling Denies abdominal pain, nausea, vomiting,diarrhea or constipation.   Denies dysuria, frequency, hesitancy or incontinence. Denies joint pain, swelling and limitation in mobility. Denies headaches, seizures, numbness, or tingling. Denies depression, anxiety or insomnia. Denies skin break down or rash.   PE  BP 130/82   Pulse 84   Resp 16   Ht 5' 11 (1.803 m)   Wt (!) 318 lb 6.4 oz (144.4 kg)   SpO2 97%   BMI 44.41 kg/m   Patient alert and oriented and in no cardiopulmonary distress.  HEENT: No facial asymmetry, EOMI,     Neck supple .  Chest: Clear to auscultation bilaterally.  CVS: S1, S2 no murmurs, no S3.Regular rate.  ABD: Soft non tender.   Ext: No edema  MS: Adequate ROM spine, shoulders, hips and knees.  Skin: Intact, no ulcerations or rash noted.  Psych: Good eye contact, normal affect. Memory intact not anxious or depressed appearing.  CNS: CN 2-12 intact, power,  normal throughout.no focal deficits noted.   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 The importance of attaining a  healthy weight is also discussed.     07/12/2023    8:55 AM 06/30/2023   10:18 AM 06/30/2023    9:28 AM 06/26/2023    9:51 AM 05/18/2023    1:55 PM 05/06/2023   10:23 AM 05/02/2023    5:11 AM  BP/Weight  Systolic BP 132 130 157 153 147 123 119  Diastolic BP 83 82 81 87 88 84 49  Wt. (Lbs) 320.2  318.4  325.2 319.08   BMI 44.66 kg/m2  44.41 kg/m2  45.36 kg/m2 44.5 kg/m2        Cellulitis of left lower extremity resolved  Morbid obesity (HCC)  Patient re-educated about  the importance of commitment to a  minimum of 150 minutes of exercise per week as able.  The importance of healthy food choices with portion control discussed, as well as eating regularly and within a 12 hour window most days. The need to choose clean , green food 50 to 75% of the time is discussed, as well as to make water the primary drink and set a goal of 64 ounces water daily.       07/12/2023    8:55 AM 06/30/2023    9:28 AM 05/18/2023    1:55 PM  Weight /BMI  Weight 320 lb 3.2 oz 318 lb 6.4 oz 325 lb 3.2 oz  Height 5' 11 (1.803 m) 5' 11 (1.803 m) 5' 11 (1.803 m)  BMI 44.66 kg/m2 44.41 kg/m2 45.36 kg/m2  Gout Check uric acid level  Vitamin D  deficiency Updated lab needed at/ before next visit.   Encounter for counseling before starting and about pre-exposure prophylaxis for HIV Discussed with pt and agrees on referral to ID clinic for management and prevention of STD's

## 2023-07-29 NOTE — Assessment & Plan Note (Signed)
 Updated lab needed at/ before next visit.

## 2023-07-29 NOTE — Assessment & Plan Note (Signed)
 resolved

## 2023-08-04 ENCOUNTER — Other Ambulatory Visit: Payer: Self-pay | Admitting: Family Medicine

## 2023-08-06 ENCOUNTER — Other Ambulatory Visit: Payer: Self-pay | Admitting: Family Medicine

## 2023-08-06 ENCOUNTER — Other Ambulatory Visit: Payer: Self-pay

## 2023-08-06 ENCOUNTER — Telehealth: Payer: Self-pay

## 2023-08-06 MED ORDER — VALACYCLOVIR HCL 500 MG PO TABS: 500.0000 mg | ORAL_TABLET | Freq: Every day | ORAL | 5 refills | Status: AC

## 2023-08-06 NOTE — Telephone Encounter (Signed)
 Copied from CRM [id number]. Topic: Clinical - Medication Question >> Aug 06, 2023 11:26 AM Sophia H wrote: Reason for CRM: Patient states Dr. Antonetta was supposed to send in an alternative for valACYclovir  (VALTREX ) 1000 MG tablet and went to the pharmacy and was told nothing new was sent in? Please advise. # [id number]  [facility] [address] [place], [place] - [address] [place]. AT [facility] [address]

## 2023-08-06 NOTE — Telephone Encounter (Signed)
 Pt given clarification. I resent both strengths of medication since pt reports pharmacy had no recent rx's on file

## 2023-08-19 ENCOUNTER — Ambulatory Visit (INDEPENDENT_AMBULATORY_CARE_PROVIDER_SITE_OTHER): Admitting: Family Medicine

## 2023-08-19 VITALS — BP 132/84 | HR 84 | Resp 18 | Ht 71.0 in | Wt 311.1 lb

## 2023-08-19 DIAGNOSIS — Z0001 Encounter for general adult medical examination with abnormal findings: Secondary | ICD-10-CM | POA: Diagnosis not present

## 2023-08-19 DIAGNOSIS — Z23 Encounter for immunization: Secondary | ICD-10-CM | POA: Diagnosis not present

## 2023-08-19 MED ORDER — AMPHETAMINE-DEXTROAMPHET ER 20 MG PO CP24: 20.0000 mg | ORAL_CAPSULE | ORAL | 0 refills | Status: DC

## 2023-08-19 MED ORDER — AMPHETAMINE-DEXTROAMPHET ER 20 MG PO CP24
20.0000 mg | ORAL_CAPSULE | ORAL | 0 refills | Status: DC
Start: 2023-09-18 — End: 2023-12-07

## 2023-08-19 NOTE — Assessment & Plan Note (Signed)
 After obtaining informed consent, the Hep B31 and HPV  vaccines are   administered , with no adverse effect noted at the time of administration.

## 2023-08-19 NOTE — Patient Instructions (Signed)
 F/U in November, call if you need me sooner  CONGRATS on weight loss, keep it up  Stay on same medications  Hep B and HPV #1 today  Nurse visits in 2 and 6 months for the other 2 Hep B and HPV vaccines  It is important that you exercise regularly at least 30 minutes 5 times a week. If you develop chest pain, have severe difficulty breathing, or feel very tired, stop exercising immediately and seek medical attention   Think about what you will eat, plan ahead. Choose  clean, green, fresh or frozen over canned, processed or packaged foods which are more sugary, salty and fatty. 70 to 75% of food eaten should be vegetables and fruit. Three meals at set times with snacks allowed between meals, but they must be fruit or vegetables. Aim to eat over a 12 hour period , example 7 am to 7 pm, and STOP after  your last meal of the day. Drink water,generally about 64 ounces per day, no other drink is as healthy. Fruit juice is best enjoyed in a healthy way, by EATING the fruit.  Thanks for choosing [facility], we consider it a privelige to serve you.

## 2023-08-19 NOTE — Assessment & Plan Note (Signed)
Annual exam as documented. Counseling done  re healthy lifestyle involving commitment to 150 minutes exercise per week, heart healthy diet, and attaining healthy weight.The importance of adequate sleep also discussed. Regular seat belt use and home safety, is also discussed. Changes in health habits are decided on by the patient with goals and time frames  set for achieving them. Immunization needs are specifically addressed at this visit.  

## 2023-08-19 NOTE — Addendum Note (Signed)
 Addended by: ANTONETTA ROLLENE BRAVO on: 08/19/2023 07:08 AM   Modules accepted: Orders

## 2023-08-19 NOTE — Addendum Note (Signed)
 Addended by: ANTONETTA ROLLENE BRAVO on: 08/19/2023 10:47 AM   Modules accepted: Orders

## 2023-08-19 NOTE — Progress Notes (Signed)
   Danny Gomez     MRN: [id number]      DOB: 1985/06/04  Chief Complaint  Patient presents with   Annual Exam    Cpe     HPI: Patient is in for annual physical exam.  Recent labs,  are reviewed. Immunization is reviewed , and  updated   PE; BP 132/84   Pulse 84   Resp 18   Ht 5' 11 (1.803 m)   Wt (!) 311 lb 1.9 oz (141.1 kg)   SpO2 98%   BMI 43.39 kg/m   Pleasant male, alert and oriented x 3, in no cardio-pulmonary distress. Afebrile. HEENT No facial trauma or asymetry. Sinuses non tender. EOMI External ears normal,  Neck: supple, no adenopathy,JVD or thyromegaly.No bruits.  Chest: Clear to ascultation bilaterally.No crackles or wheezes. Non tender to palpation  Cardiovascular system; Heart sounds normal,  S1 and  S2 ,no S3.  No murmur, or thrill. Apical beat not displaced Peripheral pulses normal.  Abdomen: Soft, non tender, no organomegaly or masses. No bruits. Bowel sounds normal. No guarding, tenderness or rebound.    Musculoskeletal exam: Full ROM of spine, hips , shoulders and knees. No deformity ,swelling or crepitus noted. No muscle wasting or atrophy.   Neurologic: Cranial nerves 2 to 12 intact. Power, tone ,sensation and reflexes normal throughout. No disturbance in gait. No tremor.  Skin: Intact, no ulceration, erythema , scaling or rash noted. Pigmentation normal throughout  Psych; Normal mood and affect. Judgement and concentration normal   Assessment & Plan:  Annual visit for general adult medical examination with abnormal findings Annual exam as documented. Counseling done  re healthy lifestyle involving commitment to 150 minutes exercise per week, heart healthy diet, and attaining healthy weight.The importance of adequate sleep also discussed. Regular seat belt use and home safety, is also discussed. Changes in health habits are decided on by the patient with goals and time frames  set for achieving them. Immunization  needs  are specifically addressed at this visit.   Immunization due After obtaining informed consent, the Hep B31 and HPV  vaccines are   administered , with no adverse effect noted at the time of administration.

## 2023-08-24 ENCOUNTER — Telehealth: Payer: Self-pay

## 2023-08-24 ENCOUNTER — Other Ambulatory Visit (HOSPITAL_COMMUNITY): Payer: Self-pay

## 2023-08-24 NOTE — Telephone Encounter (Signed)
 Pharmacy Patient Advocate Encounter  Insurance verification completed.   The patient is insured through [employer]   Ran test claim for Descovy. Currently a quantity of 30 is a 30 day supply and the co-pay is $0.00 . Apretude will need a PA.  This test claim was processed through [facility]- copay amounts may vary at other pharmacies due to pharmacy/plan contracts, or as the patient moves through the different stages of their insurance plan.

## 2023-08-30 NOTE — Progress Notes (Unsigned)
 HPI: Danny Gomez is a 38 y.o. male who presents to the [facility] pharmacy clinic to discuss and initiate PrEP.  Insured   []    Uninsured  []    Patient Active Problem List   Diagnosis Date Noted   Annual visit for general adult medical examination with abnormal findings 08/19/2023   Immunization due 08/19/2023   Western blot positive for herpes simplex virus type 1 (HSV-1) 07/29/2023   ADHD (attention deficit hyperactivity disorder), combined type 07/29/2023   Encounter for counseling before starting and about pre-exposure prophylaxis for HIV 07/29/2023   Lipid screening 07/29/2023   Hyponatremia 04/30/2023   Asthma, chronic 04/30/2023   Gout 04/30/2023   Neuropathy 04/30/2023   Left leg swelling 04/28/2023   History of DVT (deep vein thrombosis) 04/28/2023   Cellulitis of left lower extremity 04/28/2023   Depression, major, single episode, moderate (HCC) 03/30/2021   Generalized anxiety disorder 03/25/2021   Bloating symptom 07/15/2020   Nausea 07/15/2020   COVID-19 01/23/2020   Neck pain on left side 12/08/2019   Left leg DVT (HCC) 02/05/2018   Back pain with right-sided sciatica 01/24/2017   IGT (impaired glucose tolerance) 02/04/2015   Vitamin D  deficiency 02/06/2013   Allergic rhinitis 10/29/2009   Morbid obesity (HCC) 08/26/2007   Essential hypertension 08/26/2007    Patient's Medications  New Prescriptions   No medications on file  Previous Medications   ACETAMINOPHEN  (TYLENOL ) 650 MG CR TABLET    Take 1,300 mg by mouth every 8 (eight) hours as needed for pain.   ALLOPURINOL  (ZYLOPRIM ) 300 MG TABLET    Take 1 tablet (300 mg total) by mouth daily.   AMLODIPINE  (NORVASC ) 10 MG TABLET    Take 1 tablet (10 mg total) by mouth daily.   AMPHETAMINE -DEXTROAMPHETAMINE (ADDERALL XR) 20 MG 24 HR CAPSULE    Take 1 capsule (20 mg total) by mouth every morning.   AMPHETAMINE -DEXTROAMPHETAMINE (ADDERALL XR) 20 MG 24 HR CAPSULE    Take 1 capsule (20 mg total) by mouth every  morning.   AMPHETAMINE -DEXTROAMPHETAMINE (ADDERALL XR) 20 MG 24 HR CAPSULE    Take 1 capsule (20 mg total) by mouth every morning.   AMPHETAMINE -DEXTROAMPHETAMINE (ADDERALL XR) 20 MG 24 HR CAPSULE    Take 1 capsule (20 mg total) by mouth every morning.   AMPHETAMINE -DEXTROAMPHETAMINE (ADDERALL XR) 20 MG 24 HR CAPSULE    Take 1 capsule (20 mg total) by mouth every morning.   FLUTICASONE  (FLONASE ) 50 MCG/ACT NASAL SPRAY    Place 2 sprays into both nostrils daily.   TRIAMTERENE -HYDROCHLOROTHIAZIDE  (MAXZIDE-25) 37.5-25 MG TABLET    Take 1 tablet by mouth daily.   VALACYCLOVIR  (VALTREX ) 1000 MG TABLET    Take 1 tablet (1,000 mg total) by mouth 2 (two) times daily for 7 days.   VALACYCLOVIR  (VALTREX ) 1000 MG TABLET    TAKE ONE TABLET BY MOUTH TWICE A DAY FOR 7 DAYS   VALACYCLOVIR  (VALTREX ) 500 MG TABLET    Take 1 tablet (500 mg total) by mouth daily.  Modified Medications   No medications on file  Discontinued Medications   No medications on file        No data to display          Labs:  SCr: Lab Results  Component Value Date   CREATININE 1.16 06/30/2023   CREATININE 0.94 05/01/2023   CREATININE 1.16 04/29/2023   CREATININE 1.04 04/28/2023   CREATININE 0.96 03/28/2023   HIV Lab Results  Component Value Date  HIV Non Reactive 04/30/2023   HIV NONREACTIVE 02/04/2015   Hepatitis B No results found for: HEPBSAB, HEPBSAG, HEPBCAB Hepatitis C Lab Results  Component Value Date   HEPCAB NON-REACTIVE 07/31/2019   Hepatitis A No results found for: HAV RPR and STI No results found for: LABRPR, RPRTITER  STI Results GC CT  05/11/2013 12:00 AM NG: Negative  CT: Negative     Assessment: [name] is here today to discuss starting medications for HIV prevention. He was referred here from his primary care doctor, Rollene Pesa. He is currently insured with [employer]. He knows *** at PrEP at baseline and ***. He was last tested for HIV in April and it was nonreactive.    Discussed all options of PrEP - Descovy, Truvada, Apretude, and Yeztugo. He wishes to start ***  Number of partners in last 3 months: *** Partner preference: *** Sexual practices: ***  Use of prevention/protection: ***  STD history: *** History of IVDU? *** History of PEP? ***  Plan: ***  Tyrece Vanterpool L. Dakota Vanwart, PharmD, BCIDP, AAHIVP, CPP Clinical Pharmacist Practitioner - Infectious Diseases Clinical Pharmacist Lead - Specialty Pharmacy [facility]

## 2023-08-30 NOTE — Progress Notes (Deleted)
 HPI: Danny Gomez is a 38 y.o. male who presents to the [facility] pharmacy clinic to discuss and initiate PrEP.  Insured   []    Uninsured  []    Patient Active Problem List   Diagnosis Date Noted   Annual visit for general adult medical examination with abnormal findings 08/19/2023   Immunization due 08/19/2023   Western blot positive for herpes simplex virus type 1 (HSV-1) 07/29/2023   ADHD (attention deficit hyperactivity disorder), combined type 07/29/2023   Encounter for counseling before starting and about pre-exposure prophylaxis for HIV 07/29/2023   Lipid screening 07/29/2023   Hyponatremia 04/30/2023   Asthma, chronic 04/30/2023   Gout 04/30/2023   Neuropathy 04/30/2023   Left leg swelling 04/28/2023   History of DVT (deep vein thrombosis) 04/28/2023   Cellulitis of left lower extremity 04/28/2023   Depression, major, single episode, moderate (HCC) 03/30/2021   Generalized anxiety disorder 03/25/2021   Bloating symptom 07/15/2020   Nausea 07/15/2020   COVID-19 01/23/2020   Neck pain on left side 12/08/2019   Left leg DVT (HCC) 02/05/2018   Back pain with right-sided sciatica 01/24/2017   IGT (impaired glucose tolerance) 02/04/2015   Vitamin D  deficiency 02/06/2013   Allergic rhinitis 10/29/2009   Morbid obesity (HCC) 08/26/2007   Essential hypertension 08/26/2007    Patient's Medications  New Prescriptions   No medications on file  Previous Medications   ACETAMINOPHEN  (TYLENOL ) 650 MG CR TABLET    Take 1,300 mg by mouth every 8 (eight) hours as needed for pain.   ALLOPURINOL  (ZYLOPRIM ) 300 MG TABLET    Take 1 tablet (300 mg total) by mouth daily.   AMLODIPINE  (NORVASC ) 10 MG TABLET    Take 1 tablet (10 mg total) by mouth daily.   AMPHETAMINE -DEXTROAMPHETAMINE (ADDERALL XR) 20 MG 24 HR CAPSULE    Take 1 capsule (20 mg total) by mouth every morning.   AMPHETAMINE -DEXTROAMPHETAMINE (ADDERALL XR) 20 MG 24 HR CAPSULE    Take 1 capsule (20 mg total) by mouth every  morning.   AMPHETAMINE -DEXTROAMPHETAMINE (ADDERALL XR) 20 MG 24 HR CAPSULE    Take 1 capsule (20 mg total) by mouth every morning.   AMPHETAMINE -DEXTROAMPHETAMINE (ADDERALL XR) 20 MG 24 HR CAPSULE    Take 1 capsule (20 mg total) by mouth every morning.   AMPHETAMINE -DEXTROAMPHETAMINE (ADDERALL XR) 20 MG 24 HR CAPSULE    Take 1 capsule (20 mg total) by mouth every morning.   FLUTICASONE  (FLONASE ) 50 MCG/ACT NASAL SPRAY    Place 2 sprays into both nostrils daily.   TRIAMTERENE -HYDROCHLOROTHIAZIDE  (MAXZIDE-25) 37.5-25 MG TABLET    Take 1 tablet by mouth daily.   VALACYCLOVIR  (VALTREX ) 1000 MG TABLET    Take 1 tablet (1,000 mg total) by mouth 2 (two) times daily for 7 days.   VALACYCLOVIR  (VALTREX ) 1000 MG TABLET    TAKE ONE TABLET BY MOUTH TWICE A DAY FOR 7 DAYS   VALACYCLOVIR  (VALTREX ) 500 MG TABLET    Take 1 tablet (500 mg total) by mouth daily.  Modified Medications   No medications on file  Discontinued Medications   No medications on file        No data to display          Labs:  SCr: Lab Results  Component Value Date   CREATININE 1.16 06/30/2023   CREATININE 0.94 05/01/2023   CREATININE 1.16 04/29/2023   CREATININE 1.04 04/28/2023   CREATININE 0.96 03/28/2023   HIV Lab Results  Component Value Date  HIV Non Reactive 04/30/2023   HIV NONREACTIVE 02/04/2015   Hepatitis B No results found for: HEPBSAB, HEPBSAG, HEPBCAB Hepatitis C Lab Results  Component Value Date   HEPCAB NON-REACTIVE 07/31/2019   Hepatitis A No results found for: HAV RPR and STI No results found for: LABRPR, RPRTITER  STI Results GC CT  05/11/2013 12:00 AM NG: Negative  CT: Negative     Assessment: Danny Gomez is here today to discuss starting medications for HIV prevention. He was referred here from his primary care doctor, Danny Gomez. He is currently insured with [employer]. He knows *** at PrEP at baseline and ***. He was last tested for HIV in April and it was nonreactive.    Discussed all options of PrEP - Descovy, Truvada, Apretude, and Yeztugo. He wishes to start ***  Number of partners in last 3 months: *** Partner preference: *** Sexual practices: ***  Use of prevention/protection: ***  STD history: *** History of IVDU? *** History of PEP? ***  Plan: ***  Danny Gomez L. Dakota Vanwart, PharmD, BCIDP, AAHIVP, CPP Clinical Pharmacist Practitioner - Infectious Diseases Clinical Pharmacist Lead - Specialty Pharmacy [facility]

## 2023-08-31 ENCOUNTER — Ambulatory Visit: Admitting: Pharmacist

## 2023-08-31 DIAGNOSIS — Z79899 Other long term (current) drug therapy: Secondary | ICD-10-CM

## 2023-08-31 DIAGNOSIS — Z7189 Other specified counseling: Secondary | ICD-10-CM

## 2023-08-31 DIAGNOSIS — Z113 Encounter for screening for infections with a predominantly sexual mode of transmission: Secondary | ICD-10-CM

## 2023-11-10 ENCOUNTER — Telehealth: Payer: Self-pay

## 2023-11-10 ENCOUNTER — Other Ambulatory Visit: Payer: Self-pay | Admitting: Family Medicine

## 2023-11-10 MED ORDER — AMPHETAMINE-DEXTROAMPHET ER 20 MG PO CP24: 20.0000 mg | ORAL_CAPSULE | ORAL | 0 refills | Status: DC

## 2023-11-10 NOTE — Telephone Encounter (Signed)
 Spoke to patient , Dr Antonetta sent in RX to be filled on 10/29

## 2023-11-10 NOTE — Telephone Encounter (Signed)
 Copied from CRM [id number]. Topic: Clinical - Prescription Issue >> Nov 10, 2023  9:58 AM Shanda MATSU wrote: Reason for CRM: Patient called in stating that he is not given the option to req a refill prescription for med, amphetamine -dextroamphetamine (ADDERALL XR) 20 MG 24 hr capsule, patient is wanting to fix this where he is able to do so, is req a call back in regards to this.

## 2023-11-10 NOTE — Telephone Encounter (Signed)
 Patient called back, stated that he does not have enough to cover him until 10/29. Advised that the last order was placed on Sept 29 and was for 30 days. Confirmed on calendar that Oct 29 is exactly 30 days from then. Recommended pt call pharmacy to ask if they only did a partial fill d/t backorder as they would have that documented in their system.

## 2023-11-10 NOTE — Telephone Encounter (Signed)
 Copied from CRM [id number]. Topic: Clinical - Medication Refill >> Nov 10, 2023  9:55 AM Vanessa G wrote: Medication: amphetamine -dextroamphetamine (ADDERALL XR) 20 MG 24 hr capsule  Has the patient contacted their pharmacy? Yes, no refills on file.  (Agent: If no, request that the patient contact the pharmacy for the refill. If patient does not wish to contact the pharmacy document the reason why and proceed with request.) (Agent: If yes, when and what did the pharmacy advise?)  This is the patient's preferred pharmacy:  [id number] [place] - [place], [place] - [address] [place]. AT [address] [place]. [place] [address] Phone: [phone] Fax: [phone]  Is this the correct pharmacy for this prescription? Yes If no, delete pharmacy and type the correct one.   Has the prescription been filled recently? No  Is the patient out of the medication? Yes  Has the patient been seen for an appointment in the last year OR does the patient have an upcoming appointment? Yes  Can we respond through MyChart? Yes  Agent: Please be advised that Rx refills may take up to 3 business days. We ask that you follow-up with your pharmacy.

## 2023-12-07 ENCOUNTER — Encounter: Payer: Self-pay | Admitting: Family Medicine

## 2023-12-07 ENCOUNTER — Ambulatory Visit: Admitting: Family Medicine

## 2023-12-07 VITALS — BP 128/84 | HR 102 | Resp 18 | Ht 71.0 in | Wt 302.0 lb

## 2023-12-07 DIAGNOSIS — J209 Acute bronchitis, unspecified: Secondary | ICD-10-CM

## 2023-12-07 DIAGNOSIS — I10 Essential (primary) hypertension: Secondary | ICD-10-CM | POA: Diagnosis not present

## 2023-12-07 DIAGNOSIS — Z13220 Encounter for screening for lipoid disorders: Secondary | ICD-10-CM

## 2023-12-07 DIAGNOSIS — Z23 Encounter for immunization: Secondary | ICD-10-CM

## 2023-12-07 DIAGNOSIS — F902 Attention-deficit hyperactivity disorder, combined type: Secondary | ICD-10-CM | POA: Diagnosis not present

## 2023-12-07 DIAGNOSIS — M109 Gout, unspecified: Secondary | ICD-10-CM

## 2023-12-07 DIAGNOSIS — E785 Hyperlipidemia, unspecified: Secondary | ICD-10-CM

## 2023-12-07 DIAGNOSIS — J302 Other seasonal allergic rhinitis: Secondary | ICD-10-CM

## 2023-12-07 MED ORDER — AMPHETAMINE-DEXTROAMPHET ER 30 MG PO CP24
30.0000 mg | ORAL_CAPSULE | ORAL | 0 refills | Status: AC
Start: 2023-12-10 — End: ?

## 2023-12-07 MED ORDER — AZITHROMYCIN 250 MG PO TABS: ORAL_TABLET | ORAL | 0 refills | Status: AC

## 2023-12-07 MED ORDER — FLUTICASONE PROPIONATE 50 MCG/ACT NA SUSP: 2.0000 | Freq: Every day | NASAL | 6 refills | Status: AC

## 2023-12-07 NOTE — Patient Instructions (Signed)
 Follow-up in 12 weeks.  HPVt#3 at that visit.  HPV #2 today.  Higher dose of Adderall is prescribed to be started this week.  You Need to call [phone] to make an appointment with the clinic I referred you to, that is very important  Congratulations on weight loss through lifestyle change, keep this up.  Please work on improved sleep by eliminating all sound and light when you are actually sleeping, this will improve quality of sleep and therefore health.  Azithromycin is prescribed for head and chest congestion.  Commit to using Flonase  every day as well as Zyrtec for allergies.  Please get fl;u vaccine in next 2 to 3 weeks  Fasting lipid panel CMP and eGFR and uric acid level to be drawn 1 week before your next appointment.  Thanks for choosing [facility], we consider it a privelige to serve you.

## 2023-12-13 ENCOUNTER — Encounter: Payer: Self-pay | Admitting: Family Medicine

## 2023-12-13 DIAGNOSIS — J209 Acute bronchitis, unspecified: Secondary | ICD-10-CM | POA: Insufficient documentation

## 2023-12-13 MED ORDER — AMPHETAMINE-DEXTROAMPHET ER 30 MG PO CP24: 30.0000 mg | ORAL_CAPSULE | ORAL | 0 refills | Status: AC

## 2023-12-13 NOTE — Assessment & Plan Note (Signed)
 Controlled, no change in medication DASH diet and commitment to daily physical activity for a minimum of 30 minutes discussed and encouraged, as a part of hypertension management. The importance of attaining a healthy weight is also discussed.     12/07/2023    9:28 AM 12/07/2023    8:59 AM 08/19/2023    9:33 AM 08/19/2023    9:04 AM 07/12/2023    8:55 AM 06/30/2023   10:18 AM 06/30/2023    9:28 AM  BP/Weight  Systolic BP 128 143 132 153 132 130 157  Diastolic BP 84 84 84 91 83 82 81  Wt. (Lbs)  302.04  311.12 320.2  318.4  BMI  42.13 kg/m2  43.39 kg/m2 44.66 kg/m2  44.41 kg/m2

## 2023-12-13 NOTE — Assessment & Plan Note (Addendum)
 Hyperlipidemia:Low fat diet discussed and encouraged.   Lipid Panel  Lab Results  Component Value Date   CHOL 154 06/30/2023   HDL 30 (L) 06/30/2023   LDLCALC 97 06/30/2023   TRIG 152 (H) 06/30/2023   CHOLHDL 5.1 (H) 06/30/2023    Needs to lower fat intake and increase exercise Updated lab needed at/ before next visit.

## 2023-12-13 NOTE — Assessment & Plan Note (Signed)
 After obtaining informed consent, the HPV #2  vaccine is  administered , with no adverse effect noted at the time of administration.

## 2023-12-13 NOTE — Assessment & Plan Note (Signed)
 Improved, excellent weight loss with lifestyle change  Patient re-educated about  the importance of commitment to a  minimum of 150 minutes of exercise per week as able.  The importance of healthy food choices with portion control discussed, as well as eating regularly and within a 12 hour window most days. The need to choose clean , green food 50 to 75% of the time is discussed, as well as to make water the primary drink and set a goal of 64 ounces water daily.       12/07/2023    8:59 AM 08/19/2023    9:04 AM 07/12/2023    8:55 AM  Weight /BMI  Weight 302 lb 0.6 oz 311 lb 1.9 oz 320 lb 3.2 oz  Height 5' 11 (1.803 m) 5' 11 (1.803 m) 5' 11 (1.803 m)  BMI 42.13 kg/m2 43.39 kg/m2 44.66 kg/m2

## 2023-12-13 NOTE — Assessment & Plan Note (Signed)
 Improving but still sub optimal , inc dose of med to 30 mg daily

## 2023-12-13 NOTE — Assessment & Plan Note (Signed)
No recent flare check uric acid level

## 2023-12-13 NOTE — Progress Notes (Signed)
 Danny Gomez     MRN: [id number]      DOB: Sep 17, 1985  Chief Complaint  Patient presents with   Hypertension    3 month follow up    Nasal Congestion    Pt complains of nasal congestion on and off for 4 weeks. Now having drainage and cough     HPI Danny Gomez is here for follow up and re-evaluation of chronic medical conditions, medication management and review of any available recent lab and radiology data.  Preventive health is updated, specifically  Cancer screening and Immunization.   Needs to make appt with ID clinic 4 week h/o lingering head and chest congestion, at times drainage is thick and yellow   ROS Denies recent fever or chills.  Denies chest pains, palpitations and leg swelling Denies abdominal pain, nausea, vomiting,diarrhea or constipation.   Denies dysuria, frequency, hesitancy or incontinence. Denies joint pain, swelling and limitation in mobility. Denies headaches, seizures, numbness, or tingling. Denies depression, anxiety or insomnia. Denies skin break down or rash.   PE  BP 128/84   Pulse (!) 102   Resp 18   Ht 5' 11 (1.803 m)   Wt (!) 302 lb 0.6 oz (137 kg)   SpO2 96%   BMI 42.13 kg/m   Patient alert and oriented and in no cardiopulmonary distress.  HEENT: No facial asymmetry, EOMI,     Neck supple .no sinus tenderness, TM clear  Chest: adequate air entry,  scattered crackles no wheezes CVS: S1, S2 no murmurs, no S3.Regular rate.  ABD: Soft non tender.   Ext: No edema  MS: Adequate ROM spine, shoulders, hips and knees.  Skin: Intact, no ulcerations or rash noted.  Psych: Good eye contact, normal affect. Memory intact not anxious or depressed appearing.  CNS: CN 2-12 intact, power,  normal throughout.no focal deficits noted.   Assessment & Plan  ADHD (attention deficit hyperactivity disorder), combined type Improving but still sub optimal , inc dose of med to 30 mg daily  Essential hypertension Controlled, no change in  medication DASH diet and commitment to daily physical activity for a minimum of 30 minutes discussed and encouraged, as a part of hypertension management. The importance of attaining a healthy weight is also discussed.     12/07/2023    9:28 AM 12/07/2023    8:59 AM 08/19/2023    9:33 AM 08/19/2023    9:04 AM 07/12/2023    8:55 AM 06/30/2023   10:18 AM 06/30/2023    9:28 AM  BP/Weight  Systolic BP 128 143 132 153 132 130 157  Diastolic BP 84 84 84 91 83 82 81  Wt. (Lbs)  302.04  311.12 320.2  318.4  BMI  42.13 kg/m2  43.39 kg/m2 44.66 kg/m2  44.41 kg/m2       Morbid obesity (HCC) Improved, excellent weight loss with lifestyle change  Patient re-educated about  the importance of commitment to a  minimum of 150 minutes of exercise per week as able.  The importance of healthy food choices with portion control discussed, as well as eating regularly and within a 12 hour window most days. The need to choose clean , green food 50 to 75% of the time is discussed, as well as to make water the primary drink and set a goal of 64 ounces water daily.       12/07/2023    8:59 AM 08/19/2023    9:04 AM 07/12/2023    8:55 AM  Weight /  BMI  Weight 302 lb 0.6 oz 311 lb 1.9 oz 320 lb 3.2 oz  Height 5' 11 (1.803 m) 5' 11 (1.803 m) 5' 11 (1.803 m)  BMI 42.13 kg/m2 43.39 kg/m2 44.66 kg/m2      Immunization due After obtaining informed consent, the HPV #2  vaccine is  administered , with no adverse effect noted at the time of administration.   Dyslipidemia Hyperlipidemia:Low fat diet discussed and encouraged.   Lipid Panel  Lab Results  Component Value Date   CHOL 154 06/30/2023   HDL 30 (L) 06/30/2023   LDLCALC 97 06/30/2023   TRIG 152 (H) 06/30/2023   CHOLHDL 5.1 (H) 06/30/2023    Needs to lower fat intake and increase exercise Updated lab needed at/ before next visit.    Gout No recent flare check uric acid level  Acute bronchitis Z pack prescribed  Allergic  rhinitis Uncontrolled as using medication inconsistently, educated re the need to use daily as uncontrolled symptoms currently

## 2023-12-13 NOTE — Assessment & Plan Note (Signed)
 Z pack prescribed

## 2023-12-13 NOTE — Assessment & Plan Note (Signed)
 Uncontrolled as using medication inconsistently, educated re the need to use daily as uncontrolled symptoms currently

## 2023-12-20 ENCOUNTER — Other Ambulatory Visit: Payer: Self-pay | Admitting: Family Medicine

## 2024-03-07 ENCOUNTER — Ambulatory Visit: Admitting: Family Medicine
